# Patient Record
Sex: Female | Born: 1940
Health system: Southern US, Community
[De-identification: ages and names within clinical notes are randomized; demographics above are authoritative.]

## PROBLEM LIST (undated history)

## (undated) DIAGNOSIS — C801 Malignant (primary) neoplasm, unspecified: Secondary | ICD-10-CM

## (undated) DIAGNOSIS — F0280 Dementia in other diseases classified elsewhere without behavioral disturbance: Secondary | ICD-10-CM

## (undated) DIAGNOSIS — F329 Major depressive disorder, single episode, unspecified: Secondary | ICD-10-CM

## (undated) DIAGNOSIS — C50919 Malignant neoplasm of unspecified site of unspecified female breast: Secondary | ICD-10-CM

## (undated) DIAGNOSIS — H269 Unspecified cataract: Secondary | ICD-10-CM

## (undated) DIAGNOSIS — A4902 Methicillin resistant Staphylococcus aureus infection, unspecified site: Secondary | ICD-10-CM

## (undated) DIAGNOSIS — Z923 Personal history of irradiation: Secondary | ICD-10-CM

## (undated) DIAGNOSIS — E0500 Thyrotoxicosis with diffuse goiter without thyrotoxic crisis or storm: Secondary | ICD-10-CM

## (undated) DIAGNOSIS — A630 Anogenital (venereal) warts: Secondary | ICD-10-CM

## (undated) DIAGNOSIS — H3530 Unspecified macular degeneration: Secondary | ICD-10-CM

## (undated) DIAGNOSIS — F32A Depression, unspecified: Secondary | ICD-10-CM

## (undated) DIAGNOSIS — B029 Zoster without complications: Secondary | ICD-10-CM

## (undated) DIAGNOSIS — N183 Chronic kidney disease, stage 3 unspecified: Secondary | ICD-10-CM

## (undated) DIAGNOSIS — R3129 Other microscopic hematuria: Secondary | ICD-10-CM

## (undated) HISTORY — DX: Methicillin resistant Staphylococcus aureus infection, unspecified site: A49.02

## (undated) HISTORY — DX: Unspecified macular degeneration: H35.30

## (undated) HISTORY — PX: CHOLECYSTECTOMY: SHX55

## (undated) HISTORY — PX: TUBAL LIGATION: SHX77

## (undated) HISTORY — PX: BREAST LUMPECTOMY: SHX2

## (undated) HISTORY — DX: Other microscopic hematuria: R31.29

## (undated) HISTORY — PX: EYE SURGERY: SHX253

## (undated) HISTORY — DX: Chronic kidney disease, stage 3 unspecified: N18.30

## (undated) HISTORY — DX: Zoster without complications: B02.9

## (undated) HISTORY — PX: ABDOMINAL HYSTERECTOMY: SHX81

## (undated) HISTORY — DX: Unspecified cataract: H26.9

## (undated) HISTORY — DX: Anogenital (venereal) warts: A63.0

---
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
## 1988-05-07 HISTORY — PX: BREAST BIOPSY: SHX20

## 1991-11-05 HISTORY — PX: OTHER SURGICAL HISTORY: SHX169

## 1992-07-05 DIAGNOSIS — R3129 Other microscopic hematuria: Secondary | ICD-10-CM

## 1992-07-05 HISTORY — DX: Other microscopic hematuria: R31.29

## 1998-01-05 ENCOUNTER — Other Ambulatory Visit: Admission: RE | Admit: 1998-01-05 | Discharge: 1998-01-05 | Payer: Self-pay | Admitting: *Deleted

## 1998-01-05 DIAGNOSIS — A630 Anogenital (venereal) warts: Secondary | ICD-10-CM

## 1998-01-05 HISTORY — DX: Anogenital (venereal) warts: A63.0

## 1998-08-03 ENCOUNTER — Other Ambulatory Visit: Admission: RE | Admit: 1998-08-03 | Discharge: 1998-08-03 | Payer: Self-pay | Admitting: *Deleted

## 1998-08-10 ENCOUNTER — Other Ambulatory Visit: Admission: RE | Admit: 1998-08-10 | Discharge: 1998-08-10 | Payer: Self-pay | Admitting: *Deleted

## 1998-09-05 ENCOUNTER — Other Ambulatory Visit: Admission: RE | Admit: 1998-09-05 | Discharge: 1998-09-05 | Payer: Self-pay | Admitting: *Deleted

## 1998-12-26 ENCOUNTER — Other Ambulatory Visit: Admission: RE | Admit: 1998-12-26 | Discharge: 1998-12-26 | Payer: Self-pay | Admitting: *Deleted

## 1999-08-04 ENCOUNTER — Other Ambulatory Visit: Admission: RE | Admit: 1999-08-04 | Discharge: 1999-08-04 | Payer: Self-pay | Admitting: *Deleted

## 2000-07-12 ENCOUNTER — Other Ambulatory Visit: Admission: RE | Admit: 2000-07-12 | Discharge: 2000-07-12 | Payer: Self-pay | Admitting: *Deleted

## 2001-11-19 ENCOUNTER — Other Ambulatory Visit: Admission: RE | Admit: 2001-11-19 | Discharge: 2001-11-19 | Payer: Self-pay | Admitting: *Deleted

## 2004-05-07 DIAGNOSIS — C50919 Malignant neoplasm of unspecified site of unspecified female breast: Secondary | ICD-10-CM

## 2004-05-07 HISTORY — DX: Malignant neoplasm of unspecified site of unspecified female breast: C50.919

## 2004-05-25 ENCOUNTER — Encounter: Admission: RE | Admit: 2004-05-25 | Discharge: 2004-05-25 | Payer: Self-pay | Admitting: *Deleted

## 2005-06-14 ENCOUNTER — Encounter: Admission: RE | Admit: 2005-06-14 | Discharge: 2005-06-14 | Payer: Self-pay | Admitting: *Deleted

## 2005-07-05 ENCOUNTER — Encounter (INDEPENDENT_AMBULATORY_CARE_PROVIDER_SITE_OTHER): Payer: Self-pay | Admitting: Specialist

## 2005-07-05 ENCOUNTER — Encounter: Admission: RE | Admit: 2005-07-05 | Discharge: 2005-07-05 | Payer: Self-pay | Admitting: *Deleted

## 2005-07-05 ENCOUNTER — Encounter (INDEPENDENT_AMBULATORY_CARE_PROVIDER_SITE_OTHER): Payer: Self-pay | Admitting: Radiology

## 2005-07-05 DIAGNOSIS — C801 Malignant (primary) neoplasm, unspecified: Secondary | ICD-10-CM

## 2005-07-05 HISTORY — DX: Malignant (primary) neoplasm, unspecified: C80.1

## 2005-07-13 ENCOUNTER — Encounter: Admission: RE | Admit: 2005-07-13 | Discharge: 2005-07-13 | Payer: Self-pay | Admitting: General Surgery

## 2005-07-17 ENCOUNTER — Encounter: Admission: RE | Admit: 2005-07-17 | Discharge: 2005-07-17 | Payer: Self-pay | Admitting: General Surgery

## 2005-07-26 ENCOUNTER — Encounter (INDEPENDENT_AMBULATORY_CARE_PROVIDER_SITE_OTHER): Payer: Self-pay | Admitting: Specialist

## 2005-07-26 ENCOUNTER — Encounter: Admission: RE | Admit: 2005-07-26 | Discharge: 2005-07-26 | Payer: Self-pay | Admitting: General Surgery

## 2005-07-27 ENCOUNTER — Encounter: Admission: RE | Admit: 2005-07-27 | Discharge: 2005-07-27 | Payer: Self-pay | Admitting: General Surgery

## 2005-07-31 ENCOUNTER — Encounter: Admission: RE | Admit: 2005-07-31 | Discharge: 2005-07-31 | Payer: Self-pay | Admitting: General Surgery

## 2005-07-31 ENCOUNTER — Encounter (INDEPENDENT_AMBULATORY_CARE_PROVIDER_SITE_OTHER): Payer: Self-pay | Admitting: *Deleted

## 2005-07-31 ENCOUNTER — Ambulatory Visit (HOSPITAL_BASED_OUTPATIENT_CLINIC_OR_DEPARTMENT_OTHER): Admission: RE | Admit: 2005-07-31 | Discharge: 2005-07-31 | Payer: Self-pay | Admitting: General Surgery

## 2005-08-06 ENCOUNTER — Ambulatory Visit: Payer: Self-pay | Admitting: Oncology

## 2005-08-15 LAB — CBC WITH DIFFERENTIAL/PLATELET
Basophils Absolute: 0 10*3/uL (ref 0.0–0.1)
EOS%: 2.3 % (ref 0.0–7.0)
Eosinophils Absolute: 0.1 10*3/uL (ref 0.0–0.5)
HGB: 13.9 g/dL (ref 11.6–15.9)
MCH: 32.4 pg (ref 26.0–34.0)
NEUT#: 3.5 10*3/uL (ref 1.5–6.5)
RBC: 4.28 10*6/uL (ref 3.70–5.32)
RDW: 12.8 % (ref 11.3–14.5)
lymph#: 1.6 10*3/uL (ref 0.9–3.3)

## 2005-08-15 LAB — COMPREHENSIVE METABOLIC PANEL
AST: 17 U/L (ref 0–37)
Albumin: 4.4 g/dL (ref 3.5–5.2)
BUN: 28 mg/dL — ABNORMAL HIGH (ref 6–23)
Calcium: 9.5 mg/dL (ref 8.4–10.5)
Chloride: 104 mEq/L (ref 96–112)
Potassium: 4.4 mEq/L (ref 3.5–5.3)
Sodium: 140 mEq/L (ref 135–145)
Total Protein: 6.9 g/dL (ref 6.0–8.3)

## 2005-08-24 ENCOUNTER — Ambulatory Visit: Admission: RE | Admit: 2005-08-24 | Discharge: 2005-11-09 | Payer: Self-pay | Admitting: Radiation Oncology

## 2005-10-12 ENCOUNTER — Ambulatory Visit: Payer: Self-pay | Admitting: Oncology

## 2005-11-19 ENCOUNTER — Other Ambulatory Visit: Admission: RE | Admit: 2005-11-19 | Discharge: 2005-11-19 | Payer: Self-pay | Admitting: Obstetrics & Gynecology

## 2005-11-26 ENCOUNTER — Ambulatory Visit: Payer: Self-pay | Admitting: Oncology

## 2005-11-29 LAB — COMPREHENSIVE METABOLIC PANEL
Albumin: 3.9 g/dL (ref 3.5–5.2)
BUN: 21 mg/dL (ref 6–23)
CO2: 28 mEq/L (ref 19–32)
Calcium: 9.1 mg/dL (ref 8.4–10.5)
Chloride: 109 mEq/L (ref 96–112)
Glucose, Bld: 76 mg/dL (ref 70–99)
Potassium: 4.3 mEq/L (ref 3.5–5.3)

## 2005-11-29 LAB — CBC WITH DIFFERENTIAL/PLATELET
Basophils Absolute: 0 10*3/uL (ref 0.0–0.1)
Eosinophils Absolute: 0.1 10*3/uL (ref 0.0–0.5)
HGB: 13.2 g/dL (ref 11.6–15.9)
MCV: 95.1 fL (ref 81.0–101.0)
MONO#: 0.5 10*3/uL (ref 0.1–0.9)
MONO%: 11 % (ref 0.0–13.0)
NEUT#: 2.6 10*3/uL (ref 1.5–6.5)
Platelets: 244 10*3/uL (ref 145–400)
RDW: 12.8 % (ref 11.3–14.5)

## 2006-03-07 ENCOUNTER — Ambulatory Visit: Payer: Self-pay | Admitting: Oncology

## 2006-03-11 LAB — COMPREHENSIVE METABOLIC PANEL
Albumin: 4.2 g/dL (ref 3.5–5.2)
Alkaline Phosphatase: 59 U/L (ref 39–117)
BUN: 15 mg/dL (ref 6–23)
Glucose, Bld: 63 mg/dL — ABNORMAL LOW (ref 70–99)
Total Bilirubin: 0.6 mg/dL (ref 0.3–1.2)

## 2006-03-11 LAB — CBC WITH DIFFERENTIAL/PLATELET
Basophils Absolute: 0.1 10*3/uL (ref 0.0–0.1)
EOS%: 2.5 % (ref 0.0–7.0)
HCT: 40.9 % (ref 34.8–46.6)
HGB: 14 g/dL (ref 11.6–15.9)
MCH: 32.2 pg (ref 26.0–34.0)
MCV: 94.1 fL (ref 81.0–101.0)
MONO%: 10 % (ref 0.0–13.0)
NEUT%: 58.6 % (ref 39.6–76.8)

## 2006-04-19 ENCOUNTER — Ambulatory Visit: Payer: Self-pay | Admitting: Oncology

## 2006-04-23 LAB — CBC WITH DIFFERENTIAL/PLATELET
Basophils Absolute: 0 10*3/uL (ref 0.0–0.1)
HCT: 38.2 % (ref 34.8–46.6)
HGB: 12.9 g/dL (ref 11.6–15.9)
MCH: 31.7 pg (ref 26.0–34.0)
MONO#: 0.3 10*3/uL (ref 0.1–0.9)
NEUT%: 58.1 % (ref 39.6–76.8)
WBC: 3.7 10*3/uL — ABNORMAL LOW (ref 3.9–10.0)
lymph#: 1.1 10*3/uL (ref 0.9–3.3)

## 2006-06-18 ENCOUNTER — Encounter: Admission: RE | Admit: 2006-06-18 | Discharge: 2006-06-18 | Payer: Self-pay | Admitting: Oncology

## 2006-09-06 ENCOUNTER — Ambulatory Visit: Payer: Self-pay | Admitting: Oncology

## 2006-09-10 LAB — COMPREHENSIVE METABOLIC PANEL
AST: 17 U/L (ref 0–37)
Alkaline Phosphatase: 59 U/L (ref 39–117)
BUN: 20 mg/dL (ref 6–23)
Glucose, Bld: 102 mg/dL — ABNORMAL HIGH (ref 70–99)
Total Bilirubin: 0.6 mg/dL (ref 0.3–1.2)

## 2006-09-10 LAB — CBC WITH DIFFERENTIAL/PLATELET
BASO%: 0.5 % (ref 0.0–2.0)
EOS%: 2.1 % (ref 0.0–7.0)
HCT: 39.5 % (ref 34.8–46.6)
LYMPH%: 23.6 % (ref 14.0–48.0)
MCH: 32.1 pg (ref 26.0–34.0)
MCHC: 34.8 g/dL (ref 32.0–36.0)
MCV: 92.2 fL (ref 81.0–101.0)
MONO#: 0.4 10*3/uL (ref 0.1–0.9)
MONO%: 8.2 % (ref 0.0–13.0)
NEUT%: 65.6 % (ref 39.6–76.8)
Platelets: 238 10*3/uL (ref 145–400)
RBC: 4.28 10*6/uL (ref 3.70–5.32)
WBC: 4.4 10*3/uL (ref 3.9–10.0)

## 2006-10-15 ENCOUNTER — Ambulatory Visit: Payer: Self-pay | Admitting: Cardiology

## 2006-10-23 ENCOUNTER — Ambulatory Visit: Payer: Self-pay | Admitting: Cardiology

## 2006-11-11 ENCOUNTER — Ambulatory Visit: Payer: Self-pay | Admitting: Cardiology

## 2006-12-10 ENCOUNTER — Other Ambulatory Visit: Admission: RE | Admit: 2006-12-10 | Discharge: 2006-12-10 | Payer: Self-pay | Admitting: Obstetrics & Gynecology

## 2007-03-07 ENCOUNTER — Ambulatory Visit: Payer: Self-pay | Admitting: Oncology

## 2007-03-11 LAB — CBC WITH DIFFERENTIAL/PLATELET
Basophils Absolute: 0 10*3/uL (ref 0.0–0.1)
Eosinophils Absolute: 0.1 10*3/uL (ref 0.0–0.5)
HCT: 38.2 % (ref 34.8–46.6)
HGB: 13.6 g/dL (ref 11.6–15.9)
LYMPH%: 26.7 % (ref 14.0–48.0)
MONO#: 0.3 10*3/uL (ref 0.1–0.9)
NEUT#: 2.4 10*3/uL (ref 1.5–6.5)
NEUT%: 61.6 % (ref 39.6–76.8)
Platelets: 232 10*3/uL (ref 145–400)
WBC: 3.9 10*3/uL (ref 3.9–10.0)

## 2007-03-11 LAB — COMPREHENSIVE METABOLIC PANEL
BUN: 23 mg/dL (ref 6–23)
CO2: 30 mEq/L (ref 19–32)
Glucose, Bld: 99 mg/dL (ref 70–99)
Sodium: 140 mEq/L (ref 135–145)
Total Bilirubin: 0.6 mg/dL (ref 0.3–1.2)
Total Protein: 6.5 g/dL (ref 6.0–8.3)

## 2007-03-11 LAB — CANCER ANTIGEN 27.29: CA 27.29: 26 U/mL (ref 0–39)

## 2007-06-24 ENCOUNTER — Encounter: Admission: RE | Admit: 2007-06-24 | Discharge: 2007-06-24 | Payer: Self-pay | Admitting: Oncology

## 2007-08-27 ENCOUNTER — Ambulatory Visit: Payer: Self-pay | Admitting: Oncology

## 2007-08-27 LAB — COMPREHENSIVE METABOLIC PANEL
AST: 21 U/L (ref 0–37)
Albumin: 3.8 g/dL (ref 3.5–5.2)
Alkaline Phosphatase: 71 U/L (ref 39–117)
BUN: 18 mg/dL (ref 6–23)
Creatinine, Ser: 0.87 mg/dL (ref 0.40–1.20)
Glucose, Bld: 98 mg/dL (ref 70–99)
Potassium: 3.7 mEq/L (ref 3.5–5.3)

## 2007-08-27 LAB — CBC WITH DIFFERENTIAL/PLATELET
Basophils Absolute: 0.1 10*3/uL (ref 0.0–0.1)
EOS%: 2.2 % (ref 0.0–7.0)
Eosinophils Absolute: 0.1 10*3/uL (ref 0.0–0.5)
HCT: 40.2 % (ref 34.8–46.6)
HGB: 14.2 g/dL (ref 11.6–15.9)
LYMPH%: 27.4 % (ref 14.0–48.0)
MCH: 32.2 pg (ref 26.0–34.0)
MCV: 91 fL (ref 81.0–101.0)
MONO%: 9.8 % (ref 0.0–13.0)
NEUT#: 2.9 10*3/uL (ref 1.5–6.5)
NEUT%: 59.1 % (ref 39.6–76.8)
Platelets: 234 10*3/uL (ref 145–400)

## 2007-09-02 ENCOUNTER — Ambulatory Visit (HOSPITAL_COMMUNITY): Admission: RE | Admit: 2007-09-02 | Discharge: 2007-09-02 | Payer: Self-pay | Admitting: Oncology

## 2008-02-23 ENCOUNTER — Ambulatory Visit: Payer: Self-pay | Admitting: Oncology

## 2008-02-25 LAB — CBC WITH DIFFERENTIAL/PLATELET
BASO%: 2.2 % — ABNORMAL HIGH (ref 0.0–2.0)
Basophils Absolute: 0.1 10*3/uL (ref 0.0–0.1)
EOS%: 3.9 % (ref 0.0–7.0)
HCT: 38.1 % (ref 34.8–46.6)
HGB: 13.2 g/dL (ref 11.6–15.9)
MCH: 31.4 pg (ref 26.0–34.0)
MCHC: 34.6 g/dL (ref 32.0–36.0)
MCV: 90.7 fL (ref 81.0–101.0)
MONO%: 7.8 % (ref 0.0–13.0)
NEUT%: 58.9 % (ref 39.6–76.8)
RDW: 11.9 % (ref 11.3–14.5)
lymph#: 0.9 10*3/uL (ref 0.9–3.3)

## 2008-02-26 LAB — COMPREHENSIVE METABOLIC PANEL
ALT: 15 U/L (ref 0–35)
AST: 18 U/L (ref 0–37)
Alkaline Phosphatase: 62 U/L (ref 39–117)
BUN: 18 mg/dL (ref 6–23)
Chloride: 105 mEq/L (ref 96–112)
Creatinine, Ser: 0.9 mg/dL (ref 0.40–1.20)
Total Bilirubin: 0.5 mg/dL (ref 0.3–1.2)

## 2008-02-26 LAB — CANCER ANTIGEN 27.29: CA 27.29: 19 U/mL (ref 0–39)

## 2008-03-02 ENCOUNTER — Encounter: Admission: RE | Admit: 2008-03-02 | Discharge: 2008-03-02 | Payer: Self-pay | Admitting: Oncology

## 2008-03-03 ENCOUNTER — Ambulatory Visit (HOSPITAL_COMMUNITY): Admission: RE | Admit: 2008-03-03 | Discharge: 2008-03-03 | Payer: Self-pay | Admitting: Oncology

## 2008-03-11 LAB — CBC WITH DIFFERENTIAL/PLATELET
EOS%: 2.6 % (ref 0.0–7.0)
Eosinophils Absolute: 0.1 10*3/uL (ref 0.0–0.5)
LYMPH%: 23.3 % (ref 14.0–48.0)
MCH: 31.7 pg (ref 26.0–34.0)
MCV: 91.8 fL (ref 81.0–101.0)
MONO%: 12.7 % (ref 0.0–13.0)
NEUT#: 1.7 10*3/uL (ref 1.5–6.5)
Platelets: 198 10*3/uL (ref 145–400)
RBC: 4.45 10*6/uL (ref 3.70–5.32)

## 2008-03-11 LAB — COMPREHENSIVE METABOLIC PANEL
AST: 30 U/L (ref 0–37)
Alkaline Phosphatase: 66 U/L (ref 39–117)
BUN: 11 mg/dL (ref 6–23)
Chloride: 105 mEq/L (ref 96–112)
Creatinine, Ser: 0.84 mg/dL (ref 0.40–1.20)
Total Bilirubin: 0.8 mg/dL (ref 0.3–1.2)

## 2008-03-12 LAB — CANCER ANTIGEN 27.29: CA 27.29: 24 U/mL (ref 0–39)

## 2008-03-12 LAB — VITAMIN D 25 HYDROXY (VIT D DEFICIENCY, FRACTURES): Vit D, 25-Hydroxy: 27 ng/mL — ABNORMAL LOW (ref 30–89)

## 2008-06-29 ENCOUNTER — Encounter: Admission: RE | Admit: 2008-06-29 | Discharge: 2008-06-29 | Payer: Self-pay | Admitting: Oncology

## 2008-08-27 ENCOUNTER — Ambulatory Visit: Payer: Self-pay | Admitting: Oncology

## 2008-08-31 LAB — COMPREHENSIVE METABOLIC PANEL
ALT: 20 U/L (ref 0–35)
AST: 25 U/L (ref 0–37)
Albumin: 3.7 g/dL (ref 3.5–5.2)
CO2: 32 mEq/L (ref 19–32)
Calcium: 9 mg/dL (ref 8.4–10.5)
Chloride: 103 mEq/L (ref 96–112)
Creatinine, Ser: 0.9 mg/dL (ref 0.40–1.20)
Potassium: 3.5 mEq/L (ref 3.5–5.3)
Sodium: 141 mEq/L (ref 135–145)
Total Protein: 6.8 g/dL (ref 6.0–8.3)

## 2008-08-31 LAB — CBC WITH DIFFERENTIAL/PLATELET
BASO%: 1.1 % (ref 0.0–2.0)
EOS%: 2.2 % (ref 0.0–7.0)
MCH: 32.1 pg (ref 25.1–34.0)
MCHC: 34.7 g/dL (ref 31.5–36.0)
MONO#: 0.3 10*3/uL (ref 0.1–0.9)
RBC: 4.17 10*6/uL (ref 3.70–5.45)
RDW: 12.9 % (ref 11.2–14.5)
WBC: 4.1 10*3/uL (ref 3.9–10.3)
lymph#: 1.1 10*3/uL (ref 0.9–3.3)

## 2008-09-01 LAB — CANCER ANTIGEN 27.29: CA 27.29: 20 U/mL (ref 0–39)

## 2008-09-07 ENCOUNTER — Ambulatory Visit (HOSPITAL_COMMUNITY): Admission: RE | Admit: 2008-09-07 | Discharge: 2008-09-07 | Payer: Self-pay | Admitting: Oncology

## 2009-03-09 ENCOUNTER — Ambulatory Visit: Payer: Self-pay | Admitting: Oncology

## 2009-03-09 LAB — CBC WITH DIFFERENTIAL/PLATELET
BASO%: 0.9 % (ref 0.0–2.0)
Eosinophils Absolute: 0.1 10*3/uL (ref 0.0–0.5)
HCT: 38.5 % (ref 34.8–46.6)
LYMPH%: 32.2 % (ref 14.0–49.7)
MCHC: 34.4 g/dL (ref 31.5–36.0)
MCV: 94.1 fL (ref 79.5–101.0)
MONO#: 0.3 10*3/uL (ref 0.1–0.9)
MONO%: 8.3 % (ref 0.0–14.0)
NEUT%: 57.2 % (ref 38.4–76.8)
Platelets: 222 10*3/uL (ref 145–400)
RBC: 4.09 10*6/uL (ref 3.70–5.45)

## 2009-03-09 LAB — COMPREHENSIVE METABOLIC PANEL
Alkaline Phosphatase: 57 U/L (ref 39–117)
CO2: 27 mEq/L (ref 19–32)
Creatinine, Ser: 0.93 mg/dL (ref 0.40–1.20)
Glucose, Bld: 125 mg/dL — ABNORMAL HIGH (ref 70–99)
Sodium: 138 mEq/L (ref 135–145)
Total Bilirubin: 0.6 mg/dL (ref 0.3–1.2)
Total Protein: 6.4 g/dL (ref 6.0–8.3)

## 2009-03-09 LAB — CANCER ANTIGEN 27.29: CA 27.29: 21 U/mL (ref 0–39)

## 2009-07-05 ENCOUNTER — Encounter: Admission: RE | Admit: 2009-07-05 | Discharge: 2009-07-05 | Payer: Self-pay | Admitting: Oncology

## 2009-09-06 ENCOUNTER — Ambulatory Visit: Payer: Self-pay | Admitting: Oncology

## 2009-09-07 LAB — COMPREHENSIVE METABOLIC PANEL
ALT: 14 U/L (ref 0–35)
AST: 22 U/L (ref 0–37)
Albumin: 3.5 g/dL (ref 3.5–5.2)
Alkaline Phosphatase: 58 U/L (ref 39–117)
BUN: 14 mg/dL (ref 6–23)
CO2: 29 mEq/L (ref 19–32)
Calcium: 9 mg/dL (ref 8.4–10.5)
Chloride: 106 mEq/L (ref 96–112)
Creatinine, Ser: 0.94 mg/dL (ref 0.40–1.20)
Glucose, Bld: 83 mg/dL (ref 70–99)
Potassium: 3.7 mEq/L (ref 3.5–5.3)
Sodium: 140 mEq/L (ref 135–145)
Total Bilirubin: 0.6 mg/dL (ref 0.3–1.2)

## 2009-09-07 LAB — CBC WITH DIFFERENTIAL/PLATELET
Basophils Absolute: 0 10*3/uL (ref 0.0–0.1)
MCH: 31.8 pg (ref 25.1–34.0)
MCV: 93.4 fL (ref 79.5–101.0)
MONO#: 0.3 10*3/uL (ref 0.1–0.9)
RBC: 4.17 10*6/uL (ref 3.70–5.45)
WBC: 3.8 10*3/uL — ABNORMAL LOW (ref 3.9–10.3)

## 2009-09-13 ENCOUNTER — Ambulatory Visit (HOSPITAL_COMMUNITY): Admission: RE | Admit: 2009-09-13 | Discharge: 2009-09-13 | Payer: Self-pay | Admitting: Oncology

## 2010-03-07 ENCOUNTER — Ambulatory Visit: Payer: Self-pay | Admitting: Oncology

## 2010-03-09 LAB — CBC WITH DIFFERENTIAL/PLATELET
BASO%: 1.5 % (ref 0.0–2.0)
EOS%: 1.8 % (ref 0.0–7.0)
HCT: 40.3 % (ref 34.8–46.6)
HGB: 14.1 g/dL (ref 11.6–15.9)
LYMPH%: 25.7 % (ref 14.0–49.7)
MCV: 91.3 fL (ref 79.5–101.0)
MONO#: 0.4 10*3/uL (ref 0.1–0.9)
NEUT%: 61.7 % (ref 38.4–76.8)
RDW: 12.4 % (ref 11.2–14.5)
WBC: 4.3 10*3/uL (ref 3.9–10.3)

## 2010-03-09 LAB — COMPREHENSIVE METABOLIC PANEL
Albumin: 4.4 g/dL (ref 3.5–5.2)
Alkaline Phosphatase: 58 U/L (ref 39–117)
BUN: 16 mg/dL (ref 6–23)
Calcium: 9.3 mg/dL (ref 8.4–10.5)
Chloride: 102 mEq/L (ref 96–112)
Creatinine, Ser: 0.94 mg/dL (ref 0.40–1.20)
Potassium: 4 mEq/L (ref 3.5–5.3)
Sodium: 140 mEq/L (ref 135–145)

## 2010-05-27 ENCOUNTER — Other Ambulatory Visit: Payer: Self-pay | Admitting: Oncology

## 2010-05-27 DIAGNOSIS — Z9889 Other specified postprocedural states: Secondary | ICD-10-CM

## 2010-05-28 ENCOUNTER — Encounter: Payer: Self-pay | Admitting: Oncology

## 2010-06-06 ENCOUNTER — Ambulatory Visit (HOSPITAL_COMMUNITY)
Admission: RE | Admit: 2010-06-06 | Discharge: 2010-06-06 | Payer: Self-pay | Source: Home / Self Care | Attending: Obstetrics & Gynecology | Admitting: Obstetrics & Gynecology

## 2010-06-06 LAB — CBC
HCT: 41.6 % (ref 36.0–46.0)
MCH: 30.6 pg (ref 26.0–34.0)
MCHC: 33.9 g/dL (ref 30.0–36.0)
MCV: 90.2 fL (ref 78.0–100.0)

## 2010-07-10 ENCOUNTER — Ambulatory Visit
Admission: RE | Admit: 2010-07-10 | Discharge: 2010-07-10 | Disposition: A | Discharge status: Home / Self Care | Payer: Medicare Other | Source: Ambulatory Visit | Attending: Oncology | Admitting: Oncology

## 2010-07-10 DIAGNOSIS — Z9889 Other specified postprocedural states: Secondary | ICD-10-CM

## 2010-07-16 NOTE — Op Note (Signed)
NAMEVIVI, PICCIRILLI             ACCOUNT NO.:  [id number]  MEDICAL RECORD NO.:  [id number]          PATIENT TYPE:  AMB  LOCATION:  SDC                           FACILITY:  [facility]  PHYSICIAN:  M. Leda Quail, MD  DATE OF BIRTH:  1940-10-23  DATE OF PROCEDURE: DATE OF DISCHARGE:                              OPERATIVE REPORT   PREOPERATIVE DIAGNOSES: 64. A 70 year old white female with history of large right sided vulvar     lesion. 2. History of excision condyloma, same area about 2 years ago.  POSTOPERATIVE DIAGNOSES: 42. A 70 year old white female with history of large right sided vulvar     lesion. 2. History of excision condyloma, same area about 2 years ago.  PROCEDURE:  Wide local excision of right vulvar lesion.  SURGEON:  M. Leda Quail, MD.  ASSISTANT:  OR staff.  ANESTHESIA:  MAC, Dr. Malen Gauze oversaw the case.  SPECIMENS:  Vulvar lesion sent to Pathology.  FINDINGS:  Raised, large, cauliflower-appearing lesion.  ESTIMATED BLOOD LOSS:  Minimal.  FLUIDS:  1000 mL of LR.  URINE OUTPUT:  150 mL of clear urine draining with an I and O catheterization during the procedure.  COMPLICATIONS:  None.  INDICATIONS:  Ms. Victoria Sims is a very nice 70 year old single white female that I know quite well because she has pelvic relaxation and has changing and cleaning her pessary every 3 months.  I have seen this patient regularly for several years and in the last 4 months, she has had a very large vulvar lesion grow to the right vaginal introitus inferiorly.  There was essentially nothing there 4 months ago when I saw the patient the last time.  She did have a condyloma excised from this area several years ago which was sent to Pathology.  Because of this rapid growth, I recommended excision with sending it to pathology instead of topical chemical treatment or a laser ablation.  Risks and benefits have been explained to the patient.  She was desirous of proceeding and is  here today.  PROCEDURE:  The patient was taken to operating room.  She was placed in supine position.  Anesthesia was administered by the anesthesia staff without difficulty.  Legs positioned in the low lithotomy position in [place] stirrups.  SCDs were present on lower extremities bilaterally. Perineum, inner thighs, and the vagina prepped and draped in normal sterile fashion with Betadine prep.  Red rubber Foley catheter was used to drain the bladder of all urine.  The lesion is anesthetized with 1% lidocaine, 6 mL total was used.  Then with a #15 blade, the lesion is excised about 2 mm lateral all the way around in an ellipse formation. Four figure-of-eight sutures of #3-0 silk are used to close the lesion and make it hemostatic.  The patient did let me to place her pessary back, which we removed about a week ago in the office but with inspection of the vaginal cuff, she still has some abrasions from the pessary and I have elected to leave it out one more week.  When she comes back to see me next week in the office, we will  address replacing it at that time.  At this point, the procedure was ended, all instruments were removed from the vagina.  Sponge, lap, needle, and sponge counts were correct x2.  Betadine prep was cleansed off the skin. The patient is taken to her room in stable condition.     Lum Keas, MD     MSM/MEDQ  D:  06/06/2010  T:  06/07/2010  Job:  [id number]  Electronically Signed by Paul Half MD on 07/16/2010 02:59:38 PM

## 2010-09-18 ENCOUNTER — Other Ambulatory Visit: Payer: Self-pay | Admitting: Oncology

## 2010-09-18 ENCOUNTER — Encounter (HOSPITAL_BASED_OUTPATIENT_CLINIC_OR_DEPARTMENT_OTHER): Payer: Medicare Other | Admitting: Oncology

## 2010-09-18 DIAGNOSIS — C50919 Malignant neoplasm of unspecified site of unspecified female breast: Secondary | ICD-10-CM

## 2010-09-18 DIAGNOSIS — Z170 Estrogen receptor positive status [ER+]: Secondary | ICD-10-CM

## 2010-09-18 LAB — COMPREHENSIVE METABOLIC PANEL
ALT: 11 U/L (ref 0–35)
Alkaline Phosphatase: 57 U/L (ref 39–117)
Creatinine, Ser: 1.25 mg/dL — ABNORMAL HIGH (ref 0.40–1.20)
Sodium: 142 mEq/L (ref 135–145)
Total Bilirubin: 0.6 mg/dL (ref 0.3–1.2)
Total Protein: 6.5 g/dL (ref 6.0–8.3)

## 2010-09-18 LAB — CBC WITH DIFFERENTIAL/PLATELET
EOS%: 2.7 % (ref 0.0–7.0)
HCT: 39.3 % (ref 34.8–46.6)
HGB: 13.4 g/dL (ref 11.6–15.9)
MCH: 31.4 pg (ref 25.1–34.0)
MCHC: 34.2 g/dL (ref 31.5–36.0)
NEUT%: 59.4 % (ref 38.4–76.8)
RDW: 12.6 % (ref 11.2–14.5)
WBC: 3.9 10*3/uL (ref 3.9–10.3)
lymph#: 1.1 10*3/uL (ref 0.9–3.3)

## 2010-09-19 NOTE — Assessment & Plan Note (Signed)
[facility]                          [facility] NOTE   NAME:Moffet, KYLIA GRAJALES                    MRN:          [id number]  DATE:10/15/2006                            DOB:          [birth year]    REFERRING PHYSICIAN:  Valentino Hue. Magrinat, M.D.   CARDIOLOGY CONSULTATION   REASON FOR CONSULTATION:  Dyspnea on exertion and chest pain.   HISTORY OF PRESENT ILLNESS:  Ms. Hauswirth is a pleasant 70 year old  woman with a history of breast cancer status post radiation therapy and  surgical treatment with apparent remission.  Her cancer was diagnosed  approximately 1 year ago.  Additional problems include hyperlipidemia  and previous Grave's disease, now on thyroid hormone replacement.  She  has no person history of hypertension, diabetes mellitus, or coronary  artery disease.  She is referred with a fairly chronic history of  dyspnea on exertion.  Ms. Spurling states that she is not particularly  bothered by these symptoms, but does note that she becomes short of  breath sometimes when she is stressed or occasionally with activity.  She also has occasional chest pain described as indigestion that lasts  typically for a few minutes and does not have a specific trigger,  although she does seem to think that it happens more often when she is  active than when she is at rest.  She also reports some water brash in  the early morning hours and uses occasional over-the-counter Zantac.  She walks occasionally for exercise, up to 10 or 15 minutes at a time  and is able to accomplish this, not typically with chest pain although  sometimes she does experience symptoms.  She has had no prior cardiac  testing based on available information.  Her electrocardiogram today  shows sinus bradycardia at 55 beats per minute with nonspecific T wave  flattening.  We have been asked to evaluate her further.   ALLERGIES:  SULFA DRUGS.   PRESENT MEDICATIONS:  1. Paxil  20 mg p.o. daily.  2. Potassium chloride 10 mEq p.o. daily.  3. Lasix 20 mg p.o. daily.  4. Zocor 40 mg p.o. daily.  5. Levoxyl 1.25 mg p.o. daily.  6. Arthrotec 75 mg p.o. daily.  7. Calcium with vitamin D 600 mg p.o. daily.   PAST MEDICAL HISTORY:  As outlined above.  The patient is also status  post previous hysterectomy, cholecystectomy, and 5 eye surgeries.   FAMILY HISTORY:  The patient's father is 30 and has a fairly significant  history of peripheral vascular disease.  The patient's mother is 40 and  is status post pacemaker placement by Dr. Graciela Husbands in our practice.  No  apparent history of premature cardiovascular disease.   REVIEW OF SYSTEMS:  As described in the history of present illness.  Has  occasional joint stiffness.  Complains of chronic ankle edema that tends  to be worse towards the end of the day.  No obvious bleeding diathesis.  No fevers or chills, cough, hemoptysis, dizziness, or syncope.  She does  report occasional skipped heartbeats.   EXAMINATION:  Blood pressure 102/64, heart rate  55, weight 134 pounds.  The patient is normally nourished and in no acute distress.  HEENT:  Conjunctivae looks normal.  Oropharynx clear.  NECK:  Supple.  No elevated jugular venous pressure or loud bruits.  No  thyromegaly is noted.  LUNGS:  Clear.  No labored breathing at rest.  CARDIAC:  Regular rate and rhythm.  No loud murmur, pericardial rub, or  respiratory gallop.  ABDOMEN:  Soft and nontender.  No bruits.  Normoactive bowel sounds.  EXTREMITIES:  Exhibit trace edema around the ankles.  No pitting edema  is apparent on examination of the legs.  Distal pulses are 1 to 2+.  SKIN:  Warm and dry.  MUSCULOSKELETAL:  No kyphosis is noted.  NEURO/PSYCH:  The patient is alert and oriented x3.  Affect is normal.   IMPRESSION/RECOMMENDATIONS:  1. History of dyspnea on exertion and intermittent chest pain in a 89-      year-old woman with a history of tobacco use of 1 pack per  day for      the last 47 years, hyperlipidemia on statin therapy, prior breast      cancer status post radiation therapy.  Her electrocardiogram at      rest is nonspecific.  She has had no prior cardiac risk      stratification.  We discussed the issues today and will plan an      echocardiogram as well as exercise Myoview for further evaluation.      I will then have her follow up in the office to discuss the      results.  2. Smoking cessation is certainly recommended.  3. Further plan is to follow.     Jonelle Sidle, MD  Electronically Signed    SGM/MedQ  DD: 10/15/2006  DT: 10/15/2006  Job #: [id number]   cc:   Doreen Beam

## 2010-09-19 NOTE — Assessment & Plan Note (Signed)
[facility]                          [facility] NOTE   NAME:Victoria Sims, Victoria Sims                    MRN:          [id number]  DATE:11/11/2006                            DOB:          June 18, 1940    REASON FOR VISIT:  Followup cardiac testing.   HISTORY OF PRESENT ILLNESS:  I saw Victoria Sims back in June with a  history of dyspnea on exertion and intermittent chest pain which was  somewhat atypical in description. She had a history of tobacco use,  hyperlipidemia, and breast cancer status post radiation therapy, and was  referred for basic cardiac risk stratification. She had a 2D  echocardiogram which demonstrated normal left ventricular systolic  function with an ejection fraction of 60% to 65% and no segmental wall  motion abnormalities. No major valvular abnormalities were noted and  there was no pericardial effusion. Ischemic testing was performed with  an exercise Cardiolite that demonstrated adequate heart rate response  with no electrocardiographic changes and perfusion data showing no  evidence of ischemia with normal ejection fraction. I relayed these  results to the patient today and generally at this point would suggest  observation and reassurance. She does not report any typical exertional  symptoms at this point and still describes problems with indigestion.  She may ultimately need further gastrointestinal evaluation and is using  over-the-counter Zantac. She was comfortable with this.   ALLERGIES:  SULFA DRUGS.   PRESENT MEDICATIONS:  1. Paxil 20 mg p.o. daily.  2. Potassium 10 mEq daily.  3. Lasix 20 mg p.o. daily.  4. Zocor 40 mg p.o. daily.  5. Levoxyl 1.25 mg p.o. daily.  6. Arthrotec 75 mg p.o. daily.  7. Calcium 600 mg p.o. daily.   REVIEW OF SYSTEMS:  As described in history or present illness. All  othernegative.   PHYSICAL EXAMINATION:  Blood pressure is 99/65, heart rate is 54, weight  is 135 pounds.  Patient is comfortable and in no acute distress.  Examination of the neck shows no elevated jugular venous pressure or  loud bruits.  LUNGS: Clear without labored breathing.  CARDIAC EXAM: Reveals a regular rate and rhythm. There is no loud murmur  or S3 gallop.  EXTREMITIES: Exhibit no significant pitting edema.   IMPRESSION/RECOMMENDATIONS:  1. History of dyspnea on exertion (mild) and intermittent atypical      chest pain. Recent cardiac testing was reassuring indicating no      significant chronotropic incompetence,  normal left ventricular      systolic function, no major valvular abnormalities, and no clear      evidence of ischemia. I would recommend observation at this point.      She continues with regular exercise regimen. She may have some      symptoms of indigestion/reflux which are contributing as well. No      further cardiac testing planned at this point unless symptoms      progress.  2. Follow up will be p.r.n.     Jonelle Sidle, MD  Electronically Signed    SGM/MedQ  DD: 11/11/2006  DT: 11/11/2006  Job #: [id number]   cc:   Valentino Hue. Magrinat, M.D.

## 2010-09-22 NOTE — Op Note (Signed)
NAMECHARLENE, Sims             ACCOUNT NO.:  [id number]   MEDICAL RECORD NO.:  [id number]          PATIENT TYPE:  AMB   LOCATION:  DSC                          FACILITY:  [facility]   PHYSICIAN:  Ollen Gross. Vernell Morgans, M.D. DATE OF BIRTH:  Feb 27, 1941   DATE OF PROCEDURE:  07/31/2005  DATE OF DISCHARGE:                                 OPERATIVE REPORT   PREOPERATIVE DIAGNOSIS:  Right breast cancer.   POSTOPERATIVE DIAGNOSIS:  Right breast cancer.   PROCEDURE:  Right needle localized lumpectomy and sentinel lymph node biopsy  with injection of blue dye.   SURGEON:  Ollen Gross. Carolynne Edouard, M.D.   ANESTHESIA:  General.   PROCEDURE:  After informed consent was obtained, the patient was brought to  the operating room and placed in the supine position on the operating table.  After adequate induction of general anesthesia, the patient's right breast  and axilla were prepped with Betadine and draped in the usual sterile  manner.  A NeoProbe 2000 was used to identify the site of primary injection  as well as a single hot spot which was identified in the right axilla. The  primary counts at the primary injection site were approximately 12,000.  Earlier in the day, the patient had undergone nuclear medicine injection in  the subareolar area.  The patient also had had two areas in the right breast  wire localized, lower area was the area of the known cancer of another area  just above this was an area of suspicion and this area was all bracketed by  these wires.  Initially, 2 mL of methylene blue and 3 mL of injectable  saline were injected in the subareolar dermal area.  The tissue was massaged  gently for several minutes.  A small transverse incision was then made in  the right axilla overlying the hot spot. This incision was carried down  through the skin and subcutaneous tissue sharply with electrocautery until  the axilla was entered.  A Weitlaner retractor was deployed.  Blunt  dissection was then  carried out in the axilla until a blue lymph node was  identified and it also had increased radioactivity. This lymph node was  excised by a combination of sharp Bovie dissection and blunt hemostat  dissection.  Afferent and efferent lymphatics were clamped with hemostats,  divided and ligated 3-0 Vicryl ties.  The ex vivo counts on the lymph node  were approximately 120 and this lymph node was blue and was sent to  pathology for touch preps. There were no other hot spots identified in the  right axilla.  At this point the right axillary incision was closed with a  deep layer of interrupted 3-0 Vicryl stitches and the skin was closed with a  running 4-0 Monocryl subcuticular stitch.   Next, attention was turned to the right breast.  A single curvilinear  transverse incision was made with a 15 blade knife between the two wires.  This incision was carried down through the skin and subcutaneous tissues  sharply with electrocautery.  The path of the wires was fairly close to each  other and so a larger spherical area of breast tissue was excised sharply  around the course of these two wires.  This was done sharply with  electrocautery. Once this had been removed, the specimen was then oriented  with a long stitch being lateral and a short stitch being superior and sent  to pathology for further evaluation.  Hemostasis was achieved using Bovie  electrocautery.  The wound was then irrigated with copious amounts of  saline.  The incision was then closed with a deeper layer of interrupted 3-0  Vicryl stitches and the skin was closed with a running 4-0 Monocryl  subcuticular stitch.  Benzoin, Steri-Strips and sterile  dressings were applied.  The patient tolerated well.  At the end of the  case, all needle, sponge, and instrument counts were correct.  The touch  preps on the sentinel node were negative.  The patient was awakened and  taken to recovery in stable condition.      Ollen Gross. Vernell Morgans, M.D.  Electronically Signed     PST/MEDQ  D:  08/01/2005  T:  08/02/2005  Job:  [id number]

## 2011-05-09 DIAGNOSIS — F331 Major depressive disorder, recurrent, moderate: Secondary | ICD-10-CM | POA: Diagnosis not present

## 2011-05-16 DIAGNOSIS — E559 Vitamin D deficiency, unspecified: Secondary | ICD-10-CM | POA: Diagnosis not present

## 2011-05-16 DIAGNOSIS — F329 Major depressive disorder, single episode, unspecified: Secondary | ICD-10-CM | POA: Diagnosis not present

## 2011-05-16 DIAGNOSIS — E7800 Pure hypercholesterolemia, unspecified: Secondary | ICD-10-CM | POA: Diagnosis not present

## 2011-05-22 DIAGNOSIS — E7800 Pure hypercholesterolemia, unspecified: Secondary | ICD-10-CM | POA: Diagnosis not present

## 2011-05-22 DIAGNOSIS — F331 Major depressive disorder, recurrent, moderate: Secondary | ICD-10-CM | POA: Diagnosis not present

## 2011-05-22 DIAGNOSIS — F329 Major depressive disorder, single episode, unspecified: Secondary | ICD-10-CM | POA: Diagnosis not present

## 2011-05-22 DIAGNOSIS — E559 Vitamin D deficiency, unspecified: Secondary | ICD-10-CM | POA: Diagnosis not present

## 2011-05-22 DIAGNOSIS — R5383 Other fatigue: Secondary | ICD-10-CM | POA: Diagnosis not present

## 2011-06-11 ENCOUNTER — Other Ambulatory Visit: Payer: Self-pay | Admitting: Oncology

## 2011-06-11 DIAGNOSIS — Z9889 Other specified postprocedural states: Secondary | ICD-10-CM

## 2011-06-11 DIAGNOSIS — Z853 Personal history of malignant neoplasm of breast: Secondary | ICD-10-CM

## 2011-06-12 DIAGNOSIS — F331 Major depressive disorder, recurrent, moderate: Secondary | ICD-10-CM | POA: Diagnosis not present

## 2011-07-03 DIAGNOSIS — F331 Major depressive disorder, recurrent, moderate: Secondary | ICD-10-CM | POA: Diagnosis not present

## 2011-07-11 DIAGNOSIS — N3941 Urge incontinence: Secondary | ICD-10-CM | POA: Diagnosis not present

## 2011-07-17 ENCOUNTER — Ambulatory Visit
Admission: RE | Admit: 2011-07-17 | Discharge: 2011-07-17 | Disposition: A | Discharge status: Home / Self Care | Payer: Medicare Other | Source: Ambulatory Visit | Attending: Oncology | Admitting: Oncology

## 2011-07-17 DIAGNOSIS — Z853 Personal history of malignant neoplasm of breast: Secondary | ICD-10-CM | POA: Diagnosis not present

## 2011-07-17 DIAGNOSIS — Z9889 Other specified postprocedural states: Secondary | ICD-10-CM

## 2011-07-24 DIAGNOSIS — F331 Major depressive disorder, recurrent, moderate: Secondary | ICD-10-CM | POA: Diagnosis not present

## 2011-08-07 DIAGNOSIS — N318 Other neuromuscular dysfunction of bladder: Secondary | ICD-10-CM | POA: Diagnosis not present

## 2011-08-13 DIAGNOSIS — N3941 Urge incontinence: Secondary | ICD-10-CM | POA: Diagnosis not present

## 2011-08-21 DIAGNOSIS — F331 Major depressive disorder, recurrent, moderate: Secondary | ICD-10-CM | POA: Diagnosis not present

## 2011-09-04 DIAGNOSIS — F331 Major depressive disorder, recurrent, moderate: Secondary | ICD-10-CM | POA: Diagnosis not present

## 2011-09-04 DIAGNOSIS — N8111 Cystocele, midline: Secondary | ICD-10-CM | POA: Diagnosis not present

## 2011-09-17 DIAGNOSIS — R350 Frequency of micturition: Secondary | ICD-10-CM | POA: Diagnosis not present

## 2011-09-17 DIAGNOSIS — N3941 Urge incontinence: Secondary | ICD-10-CM | POA: Diagnosis not present

## 2011-09-17 DIAGNOSIS — R3915 Urgency of urination: Secondary | ICD-10-CM | POA: Diagnosis not present

## 2011-09-25 DIAGNOSIS — F331 Major depressive disorder, recurrent, moderate: Secondary | ICD-10-CM | POA: Diagnosis not present

## 2011-09-26 ENCOUNTER — Emergency Department (HOSPITAL_COMMUNITY): Payer: Medicare Other

## 2011-09-26 ENCOUNTER — Inpatient Hospital Stay (HOSPITAL_COMMUNITY)
Admission: EM | Admit: 2011-09-26 | Discharge: 2011-09-27 | DRG: 395 | Disposition: A | Discharge status: Home / Self Care | Payer: Medicare Other | Attending: General Surgery | Admitting: General Surgery

## 2011-09-26 ENCOUNTER — Encounter (HOSPITAL_COMMUNITY): Payer: Self-pay | Admitting: Emergency Medicine

## 2011-09-26 DIAGNOSIS — I498 Other specified cardiac arrhythmias: Secondary | ICD-10-CM | POA: Diagnosis not present

## 2011-09-26 DIAGNOSIS — F3289 Other specified depressive episodes: Secondary | ICD-10-CM | POA: Diagnosis present

## 2011-09-26 DIAGNOSIS — K4090 Unilateral inguinal hernia, without obstruction or gangrene, not specified as recurrent: Secondary | ICD-10-CM

## 2011-09-26 DIAGNOSIS — E0500 Thyrotoxicosis with diffuse goiter without thyrotoxic crisis or storm: Secondary | ICD-10-CM | POA: Diagnosis present

## 2011-09-26 DIAGNOSIS — R109 Unspecified abdominal pain: Secondary | ICD-10-CM | POA: Diagnosis present

## 2011-09-26 DIAGNOSIS — K4030 Unilateral inguinal hernia, with obstruction, without gangrene, not specified as recurrent: Principal | ICD-10-CM | POA: Diagnosis present

## 2011-09-26 DIAGNOSIS — F329 Major depressive disorder, single episode, unspecified: Secondary | ICD-10-CM | POA: Diagnosis present

## 2011-09-26 DIAGNOSIS — Z859 Personal history of malignant neoplasm, unspecified: Secondary | ICD-10-CM

## 2011-09-26 DIAGNOSIS — K56609 Unspecified intestinal obstruction, unspecified as to partial versus complete obstruction: Secondary | ICD-10-CM | POA: Diagnosis not present

## 2011-09-26 DIAGNOSIS — F17200 Nicotine dependence, unspecified, uncomplicated: Secondary | ICD-10-CM | POA: Diagnosis present

## 2011-09-26 DIAGNOSIS — R112 Nausea with vomiting, unspecified: Secondary | ICD-10-CM | POA: Diagnosis present

## 2011-09-26 DIAGNOSIS — R5383 Other fatigue: Secondary | ICD-10-CM | POA: Diagnosis not present

## 2011-09-26 HISTORY — DX: Malignant (primary) neoplasm, unspecified: C80.1

## 2011-09-26 HISTORY — DX: Depression, unspecified: F32.A

## 2011-09-26 HISTORY — DX: Major depressive disorder, single episode, unspecified: F32.9

## 2011-09-26 HISTORY — DX: Thyrotoxicosis with diffuse goiter without thyrotoxic crisis or storm: E05.00

## 2011-09-26 LAB — BASIC METABOLIC PANEL
BUN: 19 mg/dL (ref 6–23)
Calcium: 10.4 mg/dL (ref 8.4–10.5)
Creatinine, Ser: 0.98 mg/dL (ref 0.50–1.10)
GFR calc Af Amer: 67 mL/min — ABNORMAL LOW (ref >90)

## 2011-09-26 LAB — HEPATIC FUNCTION PANEL
Alkaline Phosphatase: 75 U/L (ref 39–117)
Bilirubin, Direct: 0.1 mg/dL (ref 0.0–0.3)
Indirect Bilirubin: 0.5 mg/dL (ref 0.3–0.9)
Total Protein: 7.3 g/dL (ref 6.0–8.3)

## 2011-09-26 LAB — CBC
HCT: 44.8 % (ref 36.0–46.0)
MCH: 30.5 pg (ref 26.0–34.0)
MCHC: 33.9 g/dL (ref 30.0–36.0)
MCV: 90 fL (ref 78.0–100.0)
RDW: 12.4 % (ref 11.5–15.5)

## 2011-09-26 LAB — DIFFERENTIAL
Basophils Absolute: 0 10*3/uL (ref 0.0–0.1)
Basophils Relative: 0 % (ref 0–1)
Eosinophils Relative: 0 % (ref 0–5)
Monocytes Absolute: 0.2 10*3/uL (ref 0.1–1.0)

## 2011-09-26 LAB — LIPASE, BLOOD: Lipase: 43 U/L (ref 11–59)

## 2011-09-26 MED ORDER — SIMVASTATIN 20 MG PO TABS
40.0000 mg | ORAL_TABLET | Freq: Every day | ORAL | Status: DC
  Administered 2011-09-26: 40 mg via ORAL
  Filled 2011-09-26: qty 2

## 2011-09-26 MED ORDER — DIPHENHYDRAMINE HCL 12.5 MG/5ML PO ELIX: 12.5000 mg | ORAL_SOLUTION | Freq: Four times a day (QID) | ORAL | Status: DC | PRN

## 2011-09-26 MED ORDER — DIPHENHYDRAMINE HCL 50 MG/ML IJ SOLN: 12.5000 mg | Freq: Four times a day (QID) | INTRAMUSCULAR | Status: DC | PRN

## 2011-09-26 MED ORDER — FUROSEMIDE 20 MG PO TABS
20.0000 mg | ORAL_TABLET | Freq: Every day | ORAL | Status: DC
  Filled 2011-09-26: qty 1

## 2011-09-26 MED ORDER — SODIUM CHLORIDE 0.9 % IV BOLUS (SEPSIS)
500.0000 mL | Freq: Once | INTRAVENOUS | Status: AC
  Administered 2011-09-26: 500 mL via INTRAVENOUS

## 2011-09-26 MED ORDER — ESCITALOPRAM OXALATE 10 MG PO TABS
40.0000 mg | ORAL_TABLET | Freq: Every day | ORAL | Status: DC
  Filled 2011-09-26: qty 4

## 2011-09-26 MED ORDER — IOHEXOL 300 MG/ML  SOLN: 100.0000 mL | Freq: Once | INTRAMUSCULAR | Status: AC | PRN

## 2011-09-26 MED ORDER — ONDANSETRON HCL 4 MG/2ML IJ SOLN
4.0000 mg | Freq: Once | INTRAMUSCULAR | Status: AC
  Administered 2011-09-26: 4 mg via INTRAVENOUS
  Filled 2011-09-26: qty 2

## 2011-09-26 MED ORDER — SODIUM CHLORIDE 0.9 % IV SOLN: INTRAVENOUS | Status: DC

## 2011-09-26 MED ORDER — ACETAMINOPHEN 325 MG PO TABS: 650.0000 mg | ORAL_TABLET | Freq: Four times a day (QID) | ORAL | Status: DC | PRN

## 2011-09-26 MED ORDER — KCL IN DEXTROSE-NACL 20-5-0.45 MEQ/L-%-% IV SOLN
INTRAVENOUS | Status: DC
  Administered 2011-09-26: 22:00:00 via INTRAVENOUS

## 2011-09-26 MED ORDER — IOHEXOL 300 MG/ML  SOLN
100.0000 mL | Freq: Once | INTRAMUSCULAR | Status: AC | PRN
  Administered 2011-09-26: 100 mL via INTRAVENOUS

## 2011-09-26 MED ORDER — ENOXAPARIN SODIUM 40 MG/0.4ML ~~LOC~~ SOLN
40.0000 mg | SUBCUTANEOUS | Status: DC
  Administered 2011-09-26: 40 mg via SUBCUTANEOUS
  Filled 2011-09-26: qty 0.4

## 2011-09-26 MED ORDER — ACETAMINOPHEN 650 MG RE SUPP: 650.0000 mg | Freq: Four times a day (QID) | RECTAL | Status: DC | PRN

## 2011-09-26 MED ORDER — HYDROCODONE-ACETAMINOPHEN 5-325 MG PO TABS: 1.0000 | ORAL_TABLET | ORAL | Status: DC | PRN

## 2011-09-26 MED ORDER — POTASSIUM CHLORIDE CRYS ER 10 MEQ PO TBCR
10.0000 meq | EXTENDED_RELEASE_TABLET | Freq: Every day | ORAL | Status: DC
  Administered 2011-09-26: 10 meq via ORAL
  Filled 2011-09-26 (×4): qty 1

## 2011-09-26 MED ORDER — ONDANSETRON HCL 4 MG/2ML IJ SOLN: 4.0000 mg | Freq: Four times a day (QID) | INTRAMUSCULAR | Status: DC | PRN

## 2011-09-26 MED ORDER — PNEUMOCOCCAL VAC POLYVALENT 25 MCG/0.5ML IJ INJ
0.5000 mL | INJECTION | INTRAMUSCULAR | Status: DC
  Filled 2011-09-26: qty 0.5

## 2011-09-26 MED ORDER — HYDROMORPHONE HCL PF 1 MG/ML IJ SOLN: 1.0000 mg | INTRAMUSCULAR | Status: DC | PRN

## 2011-09-26 MED ORDER — LEVOTHYROXINE SODIUM 25 MCG PO TABS
125.0000 ug | ORAL_TABLET | Freq: Every day | ORAL | Status: DC
  Filled 2011-09-26 (×2): qty 1

## 2011-09-26 MED ORDER — ONDANSETRON HCL 4 MG/2ML IJ SOLN: 4.0000 mg | Freq: Three times a day (TID) | INTRAMUSCULAR | Status: DC | PRN

## 2011-09-26 MED ORDER — HYDROMORPHONE HCL PF 1 MG/ML IJ SOLN
1.0000 mg | Freq: Once | INTRAMUSCULAR | Status: AC
  Administered 2011-09-26: 1 mg via INTRAVENOUS
  Filled 2011-09-26: qty 1

## 2011-09-26 NOTE — H&P (Signed)
Victoria Sims is an 71 y.o. female.   Chief Complaint: Nausea, vomiting, abdominal pain HPI: Patient is a 71 year old white female presented with a 24-hour history of worsening lower abdominal pain, nausea, vomiting. She states that she has had right groin pain with a mass intermittently in the past. Her primary care doctor has been unable to isolate it. She presented the emergency room for further evaluation treatment. A CT scan of the abdomen and pelvis revealed a small bowel obstruction secondary to incarcerated right inguinal hernia. When the emergency room physician went back to check about the right groin region, the hernia had reduced. She states her nausea and vomiting have significantly decreased. Mild abdominal pain is still present.  Past Medical History  Diagnosis Date  . Grave's disease   . Cancer   . Depression     Past Surgical History  Procedure Date  . Abdominal hysterectomy   . Cholecystectomy   . Eye surgery     History reviewed. No pertinent family history. Social History:  reports that she has been smoking.  She does not have any smokeless tobacco history on file. She reports that she does not drink alcohol or use illicit drugs.  Allergies:  Allergies  Allergen Reactions  . Sulfa Antibiotics Rash    Medications Prior to Admission  Medication Sig Dispense Refill  . Calcium-Vitamin D (CALTRATE 600 PLUS-VIT D PO) Take 1 tablet by mouth daily.      . Cholecalciferol (D3-1000) 1000 UNITS capsule Take 1,000 Units by mouth daily.      Marland Kitchen escitalopram (LEXAPRO) 20 MG tablet Take 40 mg by mouth daily.      . furosemide (LASIX) 20 MG tablet Take 20 mg by mouth daily.      Marland Kitchen levothyroxine (SYNTHROID, LEVOTHROID) 125 MCG tablet Take 125 mcg by mouth daily.      . potassium chloride (K-DUR) 10 MEQ tablet Take 10 mEq by mouth daily. Take 1 tablet on Monday, Tuesday, and Wednesday only. Do not take Thursday, Friday, Saturday, Sunday      . pravastatin (PRAVACHOL) 80 MG  tablet Take 80 mg by mouth daily.        Results for orders placed during the hospital encounter of 09/26/11 (from the past 48 hour(s))  CBC     Status: Abnormal   Collection Time   09/26/11 12:57 PM      Component Value Range Comment   WBC 8.0  4.0 - 10.5 (K/uL)    RBC 4.98  3.87 - 5.11 (MIL/uL)    Hemoglobin 15.2 (*) 12.0 - 15.0 (g/dL)    HCT 16.1  09.6 - 04.5 (%)    MCV 90.0  78.0 - 100.0 (fL)    MCH 30.5  26.0 - 34.0 (pg)    MCHC 33.9  30.0 - 36.0 (g/dL)    RDW 40.9  81.1 - 91.4 (%)    Platelets 186  150 - 400 (K/uL)   DIFFERENTIAL     Status: Abnormal   Collection Time   09/26/11 12:57 PM      Component Value Range Comment   Neutrophils Relative 91 (*) 43 - 77 (%)    Neutro Abs 7.3  1.7 - 7.7 (K/uL)    Lymphocytes Relative 6 (*) 12 - 46 (%)    Lymphs Abs 0.5 (*) 0.7 - 4.0 (K/uL)    Monocytes Relative 3  3 - 12 (%)    Monocytes Absolute 0.2  0.1 - 1.0 (K/uL)    Eosinophils Relative  0  0 - 5 (%)    Eosinophils Absolute 0.0  0.0 - 0.7 (K/uL)    Basophils Relative 0  0 - 1 (%)    Basophils Absolute 0.0  0.0 - 0.1 (K/uL)   BASIC METABOLIC PANEL     Status: Abnormal   Collection Time   09/26/11 12:57 PM      Component Value Range Comment   Sodium 139  135 - 145 (mEq/L)    Potassium 4.1  3.5 - 5.1 (mEq/L)    Chloride 99  96 - 112 (mEq/L)    CO2 32  19 - 32 (mEq/L)    Glucose, Bld 128 (*) 70 - 99 (mg/dL)    BUN 19  6 - 23 (mg/dL)    Creatinine, Ser 4.01  0.50 - 1.10 (mg/dL)    Calcium 02.7  8.4 - 10.5 (mg/dL)    GFR calc non Af Amer 58 (*) >90 (mL/min)    GFR calc Af Amer 67 (*) >90 (mL/min)   HEPATIC FUNCTION PANEL     Status: Normal   Collection Time   09/26/11 12:57 PM      Component Value Range Comment   Total Protein 7.3  6.0 - 8.3 (g/dL)    Albumin 3.6  3.5 - 5.2 (g/dL)    AST 24  0 - 37 (U/L)    ALT 14  0 - 35 (U/L)    Alkaline Phosphatase 75  39 - 117 (U/L)    Total Bilirubin 0.6  0.3 - 1.2 (mg/dL)    Bilirubin, Direct 0.1  0.0 - 0.3 (mg/dL)    Indirect  Bilirubin 0.5  0.3 - 0.9 (mg/dL)   LIPASE, BLOOD     Status: Normal   Collection Time   09/26/11 12:57 PM      Component Value Range Comment   Lipase 43  11 - 59 (U/L)   LACTIC ACID, PLASMA     Status: Normal   Collection Time   09/26/11  5:56 PM      Component Value Range Comment   Lactic Acid, Venous 1.4  0.5 - 2.2 (mmol/L)    Dg Chest 2 View  09/26/2011  *RADIOLOGY REPORT*  Clinical Data: Abdominal pain and weakness  CHEST - 2 VIEW  Comparison: 07/27/2005  Findings: Normal heart size.  Interstitial prominence.  Bronchitic changes.  No pneumothorax and no pleural effusion.  IMPRESSION: Chronic changes.  No active cardiopulmonary disease.  Original Report Authenticated By: Donavan Burnet, M.D.   Ct Abdomen Pelvis W Contrast  09/26/2011  *RADIOLOGY REPORT*  Clinical Data: Abdominal pain  CT ABDOMEN AND PELVIS WITH CONTRAST  Technique:  Multidetector CT imaging of the abdomen and pelvis was performed following the standard protocol during bolus administration of intravenous contrast.  Contrast: OMNIPAQUE IOHEXOL 300 MG/ML  SOLN  Comparison: None.  Findings: Right inguinal hernia and associated small bowel obstruction.  The stomach as well as several small bowel loops are distended.  The transition point from a distended small bowel to decompressed bowel occurs in a right inguinal hernia.  There is a small amount of fluid contained within the hernia sac worrisome for inflammatory change.  No pneumatosis.  No extraluminal bowel gas. The transition point occurs approximately 30 cm from the terminal ileum.  Bladder is distended.  No free fluid.  Uterus is absent.  Adnexa are unremarkable.  Small amount of free fluid is seen anterior to the liver.  Post cholecystectomy.  Pancreas, spleen, adrenal glands are within  normal limits.  Small cyst in the left kidney has a benign appearance. Other left kidney hypodensities are too small to characterize and are nonspecific.  Atherosclerotic changes of the aorta  are seen without focal stenosis.  Atherosclerotic changes at the origin of the celiac and SMA are noted.  IMPRESSION: Small bowel obstruction secondary to a right inguinal hernia.  The transition point occurs at approximately 30 cm from the terminal ileum.  Original Report Authenticated By: Donavan Burnet, M.D.    Review of Systems  HENT: Negative.   Eyes: Negative.   Respiratory: Negative.   Cardiovascular: Negative.   Gastrointestinal: Positive for nausea, vomiting and abdominal pain.  Genitourinary: Positive for frequency.  Musculoskeletal: Negative.   Skin: Negative.   Neurological: Negative.  Negative for weakness.  Endo/Heme/Allergies: Negative.     Blood pressure 102/66, pulse 57, temperature 98.9 F (37.2 C), temperature source Oral, resp. rate 20, height 5\' 1"  (1.549 m), weight 60.2 kg (132 lb 11.5 oz), SpO2 95.00%. Physical Exam  Constitutional: She is oriented to person, place, and time. She appears well-developed and well-nourished.  HENT:  Head: Normocephalic and atraumatic.  Neck: Normal range of motion. Neck supple.  Cardiovascular: Normal rate, regular rhythm and normal heart sounds.   Respiratory: Effort normal and breath sounds normal.  GI: Soft. There is no tenderness. There is no guarding.       Right inguinal hernia reduced.  Neurological: She is alert and oriented to person, place, and time.  Skin: Skin is warm and dry.  Psychiatric: She has a normal mood and affect. Her behavior is normal. Judgment and thought content normal.     Assessment/Plan Impression: Right inguinal hernia causing a small bowel obstruction, reduced at this point. She is being admitted to the hospital for control of her nausea and vomiting. She will be observed overnight. There is no need for acute surgical intervention at this point. She states that she does have impending surgery with urology to place a stimulator to help out with bladder atony. Will reassess in a.m.  Pearlee Arvizu  A 09/26/2011, 8:25 PM

## 2011-09-26 NOTE — ED Provider Notes (Addendum)
History   This chart was scribed for Shelda Jakes, MD by Clarita Crane. The patient was seen in room APA05/APA05. Patient's care was started at 1226.     CSN: [id number]  Arrival date & time 09/26/11  1226   First MD Initiated Contact with Patient 09/26/11 1311      Chief Complaint  Patient presents with  . Abdominal Pain    (Consider location/radiation/quality/duration/timing/severity/associated sxs/prior treatment) HPI Victoria Sims is a 71 y.o. female who presents to the Emergency Department complaining of constant moderate to severe lower abdominal pain described as sharp onset last night with associated nausea, 8 episodes of vomiting, subjective fever and decreased appetite. Patient rates abdominal pain 9 out of 10 currently. Patient reports experiencing similar pain to right side of abdomen 1 month ago which resolved on its own. Denies diarrhea, back pain, HA, neck pain, dysuria, SOB, chest pain rash. Patient with h/o abdominal hysterectomy, cholecystectomy, Grave's disease and is a current smoker.   Past Medical History  Diagnosis Date  . Grave's disease   . Cancer   . Depression     Past Surgical History  Procedure Date  . Abdominal hysterectomy   . Cholecystectomy   . Eye surgery     History reviewed. No pertinent family history.  History  Substance Use Topics  . Smoking status: Current Everyday Smoker  . Smokeless tobacco: Not on file  . Alcohol Use: No    OB History    Grav Para Term Preterm Abortions TAB SAB Ect Mult Living                  Review of Systems  Constitutional: Positive for fever and appetite change. Negative for chills.  HENT: Negative for rhinorrhea.   Eyes: Negative for pain.  Respiratory: Negative for cough and shortness of breath.   Cardiovascular: Negative for chest pain.  Gastrointestinal: Positive for nausea, vomiting and abdominal pain. Negative for diarrhea.  Genitourinary: Negative for dysuria.  Musculoskeletal:  Negative for back pain.  Skin: Negative for rash.  Neurological: Negative for weakness and headaches.    Allergies  Sulfa antibiotics  Home Medications   Current Outpatient Rx  Name Route Sig Dispense Refill  . CALTRATE 600 PLUS-VIT D PO Oral Take 1 tablet by mouth daily.    . CHOLECALCIFEROL 1000 UNITS PO CAPS Oral Take 1,000 Units by mouth daily.    Marland Kitchen ESCITALOPRAM OXALATE 20 MG PO TABS Oral Take 40 mg by mouth daily.    . FUROSEMIDE 20 MG PO TABS Oral Take 20 mg by mouth daily.    Marland Kitchen LEVOTHYROXINE SODIUM 125 MCG PO TABS Oral Take 125 mcg by mouth daily.    Marland Kitchen POTASSIUM CHLORIDE ER 10 MEQ PO TBCR Oral Take 10 mEq by mouth daily. Take 1 tablet on Monday, Tuesday, and Wednesday only. Do not take Thursday, Friday, Saturday, Sunday    . PRAVASTATIN SODIUM 80 MG PO TABS Oral Take 80 mg by mouth daily.      BP 122/53  Pulse 57  Temp(Src) 98.7 F (37.1 C) (Oral)  Resp 17  Ht 5\' 1"  (1.549 m)  Wt 125 lb (56.7 kg)  BMI 23.62 kg/m2  SpO2 98%  Physical Exam  Nursing note and vitals reviewed. Constitutional: She is oriented to person, place, and time. She appears well-developed and well-nourished. No distress.  HENT:  Head: Normocephalic and atraumatic.  Eyes: EOM are normal. Pupils are equal, round, and reactive to light.  Neck: Neck supple. No tracheal  deviation present.  Cardiovascular: Normal rate and regular rhythm.  Exam reveals no gallop and no friction rub.   No murmur heard. Pulmonary/Chest: Effort normal. No respiratory distress. She has no wheezes. She has no rales.  Abdominal: Soft. Bowel sounds are normal. She exhibits no distension. There is no tenderness.       Well healed midline surgical scar.   Musculoskeletal: Normal range of motion. She exhibits edema (trace to left lower leg).  Neurological: She is alert and oriented to person, place, and time. No cranial nerve deficit or sensory deficit.  Skin: Skin is warm and dry.  Psychiatric: She has a normal mood and affect.  Her behavior is normal.    ED Course  Procedures (including critical care time)  DIAGNOSTIC STUDIES: Oxygen Saturation is 98% on room air, normal by my interpretation.    COORDINATION OF CARE: 2:58PM- Patient informed of current plan for treatment and evaluation and agrees with plan at this time.     Labs Reviewed  CBC - Abnormal; Notable for the following:    Hemoglobin 15.2 (*)    All other components within normal limits  DIFFERENTIAL - Abnormal; Notable for the following:    Neutrophils Relative 91 (*)    Lymphocytes Relative 6 (*)    Lymphs Abs 0.5 (*)    All other components within normal limits  BASIC METABOLIC PANEL - Abnormal; Notable for the following:    Glucose, Bld 128 (*)    GFR calc non Af Amer 58 (*)    GFR calc Af Amer 67 (*)    All other components within normal limits  HEPATIC FUNCTION PANEL  LIPASE, BLOOD   Dg Chest 2 View  09/26/2011  *RADIOLOGY REPORT*  Clinical Data: Abdominal pain and weakness  CHEST - 2 VIEW  Comparison: 07/27/2005  Findings: Normal heart size.  Interstitial prominence.  Bronchitic changes.  No pneumothorax and no pleural effusion.  IMPRESSION: Chronic changes.  No active cardiopulmonary disease.  Original Report Authenticated By: Donavan Burnet, M.D.   Ct Abdomen Pelvis W Contrast  09/26/2011  *RADIOLOGY REPORT*  Clinical Data: Abdominal pain  CT ABDOMEN AND PELVIS WITH CONTRAST  Technique:  Multidetector CT imaging of the abdomen and pelvis was performed following the standard protocol during bolus administration of intravenous contrast.  Contrast: OMNIPAQUE IOHEXOL 300 MG/ML  SOLN  Comparison: None.  Findings: Right inguinal hernia and associated small bowel obstruction.  The stomach as well as several small bowel loops are distended.  The transition point from a distended small bowel to decompressed bowel occurs in a right inguinal hernia.  There is a small amount of fluid contained within the hernia sac worrisome for inflammatory  change.  No pneumatosis.  No extraluminal bowel gas. The transition point occurs approximately 30 cm from the terminal ileum.  Bladder is distended.  No free fluid.  Uterus is absent.  Adnexa are unremarkable.  Small amount of free fluid is seen anterior to the liver.  Post cholecystectomy.  Pancreas, spleen, adrenal glands are within normal limits.  Small cyst in the left kidney has a benign appearance. Other left kidney hypodensities are too small to characterize and are nonspecific.  Atherosclerotic changes of the aorta are seen without focal stenosis.  Atherosclerotic changes at the origin of the celiac and SMA are noted.  IMPRESSION: Small bowel obstruction secondary to a right inguinal hernia.  The transition point occurs at approximately 30 cm from the terminal ileum.  Original Report Authenticated By: Merton Border  Joseph Art, M.D.   Results for orders placed during the hospital encounter of 09/26/11  CBC      Component Value Range   WBC 8.0  4.0 - 10.5 (K/uL)   RBC 4.98  3.87 - 5.11 (MIL/uL)   Hemoglobin 15.2 (*) 12.0 - 15.0 (g/dL)   HCT 40.9  81.1 - 91.4 (%)   MCV 90.0  78.0 - 100.0 (fL)   MCH 30.5  26.0 - 34.0 (pg)   MCHC 33.9  30.0 - 36.0 (g/dL)   RDW 78.2  95.6 - 21.3 (%)   Platelets 186  150 - 400 (K/uL)  DIFFERENTIAL      Component Value Range   Neutrophils Relative 91 (*) 43 - 77 (%)   Neutro Abs 7.3  1.7 - 7.7 (K/uL)   Lymphocytes Relative 6 (*) 12 - 46 (%)   Lymphs Abs 0.5 (*) 0.7 - 4.0 (K/uL)   Monocytes Relative 3  3 - 12 (%)   Monocytes Absolute 0.2  0.1 - 1.0 (K/uL)   Eosinophils Relative 0  0 - 5 (%)   Eosinophils Absolute 0.0  0.0 - 0.7 (K/uL)   Basophils Relative 0  0 - 1 (%)   Basophils Absolute 0.0  0.0 - 0.1 (K/uL)  BASIC METABOLIC PANEL      Component Value Range   Sodium 139  135 - 145 (mEq/L)   Potassium 4.1  3.5 - 5.1 (mEq/L)   Chloride 99  96 - 112 (mEq/L)   CO2 32  19 - 32 (mEq/L)   Glucose, Bld 128 (*) 70 - 99 (mg/dL)   BUN 19  6 - 23 (mg/dL)   Creatinine,  Ser 0.86  0.50 - 1.10 (mg/dL)   Calcium 57.8  8.4 - 10.5 (mg/dL)   GFR calc non Af Amer 58 (*) >90 (mL/min)   GFR calc Af Amer 67 (*) >90 (mL/min)  HEPATIC FUNCTION PANEL      Component Value Range   Total Protein 7.3  6.0 - 8.3 (g/dL)   Albumin 3.6  3.5 - 5.2 (g/dL)   AST 24  0 - 37 (U/L)   ALT 14  0 - 35 (U/L)   Alkaline Phosphatase 75  39 - 117 (U/L)   Total Bilirubin 0.6  0.3 - 1.2 (mg/dL)   Bilirubin, Direct 0.1  0.0 - 0.3 (mg/dL)   Indirect Bilirubin 0.5  0.3 - 0.9 (mg/dL)  LIPASE, BLOOD      Component Value Range   Lipase 43  11 - 59 (U/L)    Date: 09/26/2011  Rate: 57  Rhythm: sinus bradycardia  QRS Axis: left  Intervals: normal  ST/T Wave abnormalities: nonspecific ST/T changes  Conduction Disutrbances:none  Narrative Interpretation:   Old EKG Reviewed: unchanged EKG unchanged from 07/27/2005   1. SBO (small bowel obstruction)   2. Right inguinal hernia       MDM   CT scan confirmed small bowel obstruction due to a right inguinal hernia. Patient improved in the emergency department with pain medicine and nausea medicine have a consult in to talk to Gen. surgery for the admission.  An addendum on reevaluation I suspect that the writing more hernia reduced itself since a CT has been done no evidence of a bulge there also patient feels as if the fullness has resolved in that area. Also patient says that the nausea has resolved. Discussed with general surgery Dr. Lovell Sheehan he will admit and observe overnight.    I personally performed the services described  in this documentation, which was scribed in my presence. The recorded information has been reviewed and considered.     Shelda Jakes, MD 09/26/11 1732  Shelda Jakes, MD 09/29/11 [phone]

## 2011-09-26 NOTE — ED Notes (Signed)
abd pain since last night to upper/mid abd area. Vomited total 7 times. 10 at worst but now rates pain 4. No diarrhea. Alert/oriented. Pain is sharp to abd area. Denies bloody or black emesis. Generalized weakness observed. cbd in route 119.

## 2011-09-27 NOTE — Progress Notes (Signed)
Patient given discharge instructions with no questions. Told to f/u with Dr. Lovell Sheehan if symptoms reoccur. No questions at this time. Taken out of facility by NT via wheelchair.

## 2011-09-27 NOTE — Discharge Summary (Signed)
Physician Discharge Summary  Patient ID: EULALA NEWCOMBE MRN: [id number] DOB/AGE: March 23, 1941 71 y.o.  Admit date: 09/26/2011 Discharge date: 09/27/2011  Admission Diagnoses: Small bowel obstruction secondary to right inguinal hernia  Discharge Diagnoses:same,  Resolved Active Problems:  * No active hospital problems. *    Discharged Condition: good  Hospital Course:  patient is a 71 year old white female who presented emergency room yesterday evening with worsening nausea, vomiting, and right groin pain. She is on CT scan the abdomen to have a small bowel obstruction secondary to incarcerated right renal hernia. This spontaneously reduced on its own while she was in the emergency room. She was made to the hospital overnight for control of her nausea, vomiting, abdominal pain. She had no significant nausea or vomiting overnight. Her abdominal pain is minimal. She is being discharged home in good improving condition.  Significant Diagnostic Studies: radiology: CT scan: As above   Discharge Exam: Blood pressure 102/62, pulse 65, temperature 98.5 F (36.9 C), temperature source Oral, resp. rate 20, height 5\' 1"  (1.549 m), weight 60.2 kg (132 lb 11.5 oz), SpO2 98.00%. General appearance: alert, cooperative and no distress Resp: clear to auscultation bilaterally Cardio: regular rate and rhythm, S1, S2 normal, no murmur, click, rub or gallop GI: soft, non-tender; bowel sounds normal; no masses,  no organomegaly. No incarcerated contents in right inguinal hernia.  Disposition: home   Medication List  As of 09/27/2011  7:06 AM   TAKE these medications         CALTRATE 600 PLUS-VIT D PO   Take 1 tablet by mouth daily.      D3-1000 1000 UNITS capsule   Generic drug: Cholecalciferol   Take 1,000 Units by mouth daily.      escitalopram 20 MG tablet   Commonly known as: LEXAPRO   Take 40 mg by mouth daily.      furosemide 20 MG tablet   Commonly known as: LASIX   Take 20 mg by mouth  daily.      levothyroxine 125 MCG tablet   Commonly known as: SYNTHROID, LEVOTHROID   Take 125 mcg by mouth daily.      potassium chloride 10 MEQ tablet   Commonly known as: K-DUR   Take 10 mEq by mouth daily. Take 1 tablet on Monday, Tuesday, and Wednesday only. Do not take Thursday, Friday, Saturday, Sunday      pravastatin 80 MG tablet   Commonly known as: PRAVACHOL   Take 80 mg by mouth daily.           Follow-up Information    Follow up with Dalia Heading, MD. (As needed)    Contact information:   [address] [place] [address] [phone]          Signed: Dalia Heading 09/27/2011, 7:06 AM

## 2011-09-27 NOTE — Progress Notes (Signed)
UR Chart Review Completed  

## 2011-09-27 NOTE — Discharge Instructions (Signed)
Hernia  A hernia occurs when an internal organ pushes out through a weak spot in the abdominal wall. Hernias most commonly occur in the groin and around the navel. Hernias often can be pushed back into place (reduced). Most hernias tend to get worse over time. Some abdominal hernias can get stuck in the opening (irreducible or incarcerated hernia) and cannot be reduced. An irreducible abdominal hernia which is tightly squeezed into the opening is at risk for impaired blood supply (strangulated hernia). A strangulated hernia is a medical emergency. Because of the risk for an irreducible or strangulated hernia, surgery may be recommended to repair a hernia.  CAUSES    Heavy lifting.   Prolonged coughing.   Straining to have a bowel movement.   A cut (incision) made during an abdominal surgery.  HOME CARE INSTRUCTIONS    Bed rest is not required. You may continue your normal activities.   Avoid lifting more than 10 pounds (4.5 kg) or straining.   Cough gently. If you are a smoker it is best to stop. Even the best hernia repair can break down with the continual strain of coughing. Even if you do not have your hernia repaired, a cough will continue to aggravate the problem.   Do not wear anything tight over your hernia. Do not try to keep it in with an outside bandage or truss. These can damage abdominal contents if they are trapped within the hernia sac.   Eat a normal diet.   Avoid constipation. Straining over long periods of time will increase hernia size and encourage breakdown of repairs. If you cannot do this with diet alone, stool softeners may be used.  SEEK IMMEDIATE MEDICAL CARE IF:    You have a fever.   You develop increasing abdominal pain.   You feel nauseous or vomit.   Your hernia is stuck outside the abdomen, looks discolored, feels hard, or is tender.   You have any changes in your bowel habits or in the hernia that are unusual for you.   You have increased pain or swelling around the  hernia.   You cannot push the hernia back in place by applying gentle pressure while lying down.  MAKE SURE YOU:    Understand these instructions.   Will watch your condition.   Will get help right away if you are not doing well or get worse.  Document Released: 04/23/2005 Document Revised: 04/12/2011 Document Reviewed: 12/11/2007  [employer] Patient Information 2012 [employer]

## 2011-09-28 ENCOUNTER — Telehealth (INDEPENDENT_AMBULATORY_CARE_PROVIDER_SITE_OTHER): Payer: Self-pay | Admitting: General Surgery

## 2011-09-28 NOTE — Telephone Encounter (Signed)
Victoria Sims called in stating she saw patient who has inguinal hernia w/intermittent bowel obstruction. She wanted an appointment for the patient to be seen. She stated the contact information for the patient was [phone] (cell) and [phone] (work). She also requested we fax her [phone]) with the appointment date for this patient so that she would have that information for her records (still on paper charts). She advised patient was seen at [facility] this week.

## 2011-09-28 NOTE — Telephone Encounter (Signed)
Called patient to advise that her gyn Leda Quail called and wanted an appointment set up for her. I advised I checked and the only appointment I could make for her was 10/22/11 at 2:30 pm. I advised that was the best I could find because of the doctors availability. I advised that if there was a cancellation we will try to contact her in order to be seen sooner.

## 2011-09-29 ENCOUNTER — Encounter (HOSPITAL_COMMUNITY): Payer: Self-pay | Admitting: Anesthesiology

## 2011-09-29 ENCOUNTER — Encounter (HOSPITAL_COMMUNITY): Admission: EM | Disposition: A | Discharge status: Home / Self Care | Payer: Self-pay | Source: Home / Self Care

## 2011-09-29 ENCOUNTER — Emergency Department (HOSPITAL_COMMUNITY): Payer: Medicare Other

## 2011-09-29 ENCOUNTER — Inpatient Hospital Stay (HOSPITAL_COMMUNITY): Payer: Medicare Other | Admitting: Anesthesiology

## 2011-09-29 ENCOUNTER — Inpatient Hospital Stay (HOSPITAL_COMMUNITY)
Admission: EM | Admit: 2011-09-29 | Discharge: 2011-10-01 | DRG: 352 | Disposition: A | Discharge status: Home / Self Care | Payer: Medicare Other | Attending: Surgery | Admitting: Surgery

## 2011-09-29 DIAGNOSIS — Z9079 Acquired absence of other genital organ(s): Secondary | ICD-10-CM

## 2011-09-29 DIAGNOSIS — K56609 Unspecified intestinal obstruction, unspecified as to partial versus complete obstruction: Secondary | ICD-10-CM

## 2011-09-29 DIAGNOSIS — R197 Diarrhea, unspecified: Secondary | ICD-10-CM | POA: Diagnosis not present

## 2011-09-29 DIAGNOSIS — E0500 Thyrotoxicosis with diffuse goiter without thyrotoxic crisis or storm: Secondary | ICD-10-CM | POA: Diagnosis not present

## 2011-09-29 DIAGNOSIS — F3289 Other specified depressive episodes: Secondary | ICD-10-CM | POA: Diagnosis not present

## 2011-09-29 DIAGNOSIS — K4030 Unilateral inguinal hernia, with obstruction, without gangrene, not specified as recurrent: Secondary | ICD-10-CM | POA: Diagnosis not present

## 2011-09-29 DIAGNOSIS — F329 Major depressive disorder, single episode, unspecified: Secondary | ICD-10-CM | POA: Diagnosis not present

## 2011-09-29 DIAGNOSIS — Z9089 Acquired absence of other organs: Secondary | ICD-10-CM

## 2011-09-29 DIAGNOSIS — F17200 Nicotine dependence, unspecified, uncomplicated: Secondary | ICD-10-CM | POA: Diagnosis present

## 2011-09-29 DIAGNOSIS — Z79899 Other long term (current) drug therapy: Secondary | ICD-10-CM

## 2011-09-29 DIAGNOSIS — K4031 Unilateral inguinal hernia, with obstruction, without gangrene, recurrent: Principal | ICD-10-CM | POA: Diagnosis present

## 2011-09-29 DIAGNOSIS — R112 Nausea with vomiting, unspecified: Secondary | ICD-10-CM | POA: Diagnosis not present

## 2011-09-29 DIAGNOSIS — K4090 Unilateral inguinal hernia, without obstruction or gangrene, not specified as recurrent: Secondary | ICD-10-CM | POA: Diagnosis not present

## 2011-09-29 DIAGNOSIS — R109 Unspecified abdominal pain: Secondary | ICD-10-CM | POA: Diagnosis not present

## 2011-09-29 DIAGNOSIS — R52 Pain, unspecified: Secondary | ICD-10-CM | POA: Diagnosis not present

## 2011-09-29 HISTORY — PX: INGUINAL HERNIA REPAIR: SHX194

## 2011-09-29 LAB — COMPREHENSIVE METABOLIC PANEL
AST: 24 U/L (ref 0–37)
CO2: 33 mEq/L — ABNORMAL HIGH (ref 19–32)
Calcium: 9.4 mg/dL (ref 8.4–10.5)
Creatinine, Ser: 0.94 mg/dL (ref 0.50–1.10)
GFR calc Af Amer: 71 mL/min — ABNORMAL LOW (ref >90)
GFR calc non Af Amer: 61 mL/min — ABNORMAL LOW (ref >90)

## 2011-09-29 LAB — SURGICAL PCR SCREEN: Staphylococcus aureus: NEGATIVE

## 2011-09-29 LAB — CBC
HCT: 41.4 % (ref 36.0–46.0)
MCH: 30.2 pg (ref 26.0–34.0)
MCHC: 33.8 g/dL (ref 30.0–36.0)
MCV: 89.4 fL (ref 78.0–100.0)
RDW: 12.3 % (ref 11.5–15.5)
WBC: 7.9 10*3/uL (ref 4.0–10.5)

## 2011-09-29 LAB — DIFFERENTIAL
Basophils Absolute: 0 10*3/uL (ref 0.0–0.1)
Eosinophils Absolute: 0 10*3/uL (ref 0.0–0.7)
Eosinophils Relative: 0 % (ref 0–5)
Lymphocytes Relative: 11 % — ABNORMAL LOW (ref 12–46)
Monocytes Absolute: 0.7 10*3/uL (ref 0.1–1.0)

## 2011-09-29 SURGERY — REPAIR, HERNIA, INGUINAL, ADULT
Anesthesia: General | Site: Groin | Laterality: Right

## 2011-09-29 MED ORDER — CEFAZOLIN SODIUM 1-5 GM-% IV SOLN
1.0000 g | Freq: Once | INTRAVENOUS | Status: AC
  Administered 2011-09-29: 1 g via INTRAVENOUS
  Filled 2011-09-29 (×2): qty 50

## 2011-09-29 MED ORDER — MORPHINE SULFATE 2 MG/ML IJ SOLN
2.0000 mg | INTRAMUSCULAR | Status: DC | PRN
  Administered 2011-09-29 – 2011-09-30 (×6): 2 mg via INTRAVENOUS
  Filled 2011-09-29 (×7): qty 1

## 2011-09-29 MED ORDER — SODIUM CHLORIDE 0.9 % IV BOLUS (SEPSIS)
500.0000 mL | Freq: Once | INTRAVENOUS | Status: AC
  Administered 2011-09-29: 500 mL via INTRAVENOUS

## 2011-09-29 MED ORDER — ACETAMINOPHEN 10 MG/ML IV SOLN
INTRAVENOUS | Status: DC | PRN
  Administered 2011-09-29: 1000 mg via INTRAVENOUS

## 2011-09-29 MED ORDER — BUPIVACAINE HCL (PF) 0.25 % IJ SOLN
INTRAMUSCULAR | Status: DC | PRN
  Administered 2011-09-29: 30 mL

## 2011-09-29 MED ORDER — PROMETHAZINE HCL 25 MG/ML IJ SOLN: 6.2500 mg | INTRAMUSCULAR | Status: DC | PRN

## 2011-09-29 MED ORDER — KCL IN DEXTROSE-NACL 20-5-0.45 MEQ/L-%-% IV SOLN
INTRAVENOUS | Status: DC
  Administered 2011-09-29 – 2011-10-01 (×4): via INTRAVENOUS
  Filled 2011-09-29 (×7): qty 1000

## 2011-09-29 MED ORDER — ACETAMINOPHEN 650 MG RE SUPP: 650.0000 mg | Freq: Four times a day (QID) | RECTAL | Status: DC | PRN

## 2011-09-29 MED ORDER — HYDROMORPHONE HCL PF 1 MG/ML IJ SOLN
0.2500 mg | INTRAMUSCULAR | Status: DC | PRN
  Administered 2011-09-29 (×4): 0.5 mg via INTRAVENOUS

## 2011-09-29 MED ORDER — BUPIVACAINE HCL (PF) 0.25 % IJ SOLN
INTRAMUSCULAR | Status: AC
  Filled 2011-09-29: qty 30

## 2011-09-29 MED ORDER — ACETAMINOPHEN 325 MG PO TABS: 650.0000 mg | ORAL_TABLET | Freq: Four times a day (QID) | ORAL | Status: DC | PRN

## 2011-09-29 MED ORDER — PROPOFOL 10 MG/ML IV EMUL
INTRAVENOUS | Status: DC | PRN
  Administered 2011-09-29: 120 mg via INTRAVENOUS

## 2011-09-29 MED ORDER — CISATRACURIUM BESYLATE (PF) 10 MG/5ML IV SOLN
INTRAVENOUS | Status: DC | PRN
  Administered 2011-09-29: 5 mg via INTRAVENOUS

## 2011-09-29 MED ORDER — 0.9 % SODIUM CHLORIDE (POUR BTL) OPTIME
TOPICAL | Status: DC | PRN
  Administered 2011-09-29: 1000 mL

## 2011-09-29 MED ORDER — ONDANSETRON HCL 4 MG/2ML IJ SOLN: 4.0000 mg | Freq: Four times a day (QID) | INTRAMUSCULAR | Status: DC | PRN

## 2011-09-29 MED ORDER — SUCCINYLCHOLINE CHLORIDE 20 MG/ML IJ SOLN
INTRAMUSCULAR | Status: DC | PRN
  Administered 2011-09-29: 60 mg via INTRAVENOUS

## 2011-09-29 MED ORDER — LACTATED RINGERS IV SOLN
INTRAVENOUS | Status: DC | PRN
  Administered 2011-09-29 (×2): via INTRAVENOUS

## 2011-09-29 MED ORDER — ACETAMINOPHEN 10 MG/ML IV SOLN
INTRAVENOUS | Status: AC
  Filled 2011-09-29: qty 100

## 2011-09-29 MED ORDER — GLYCOPYRROLATE 0.2 MG/ML IJ SOLN
INTRAMUSCULAR | Status: DC | PRN
  Administered 2011-09-29: .4 mg via INTRAVENOUS

## 2011-09-29 MED ORDER — LEVOTHYROXINE SODIUM 125 MCG PO TABS
125.0000 ug | ORAL_TABLET | Freq: Every day | ORAL | Status: DC
  Administered 2011-09-29: 125 ug via ORAL
  Filled 2011-09-29: qty 1

## 2011-09-29 MED ORDER — ESCITALOPRAM OXALATE 20 MG PO TABS
40.0000 mg | ORAL_TABLET | Freq: Every day | ORAL | Status: DC
  Filled 2011-09-29: qty 2

## 2011-09-29 MED ORDER — ONDANSETRON HCL 4 MG/2ML IJ SOLN
INTRAMUSCULAR | Status: DC | PRN
  Administered 2011-09-29: 4 mg via INTRAVENOUS

## 2011-09-29 MED ORDER — HYDROMORPHONE HCL PF 1 MG/ML IJ SOLN
INTRAMUSCULAR | Status: AC
  Filled 2011-09-29: qty 1

## 2011-09-29 MED ORDER — MORPHINE SULFATE 2 MG/ML IJ SOLN
2.0000 mg | Freq: Once | INTRAMUSCULAR | Status: AC
  Administered 2011-09-29: 2 mg via INTRAVENOUS
  Filled 2011-09-29: qty 1

## 2011-09-29 MED ORDER — LEVOTHYROXINE SODIUM 125 MCG PO TABS
125.0000 ug | ORAL_TABLET | Freq: Every day | ORAL | Status: DC
  Administered 2011-09-29 – 2011-10-01 (×3): 125 ug via ORAL
  Filled 2011-09-29 (×3): qty 1

## 2011-09-29 MED ORDER — ENOXAPARIN SODIUM 40 MG/0.4ML ~~LOC~~ SOLN
40.0000 mg | SUBCUTANEOUS | Status: DC
  Administered 2011-09-29 – 2011-10-01 (×3): 40 mg via SUBCUTANEOUS
  Filled 2011-09-29 (×3): qty 0.4

## 2011-09-29 MED ORDER — ONDANSETRON HCL 4 MG/2ML IJ SOLN
4.0000 mg | Freq: Once | INTRAMUSCULAR | Status: AC
  Administered 2011-09-29: 4 mg via INTRAVENOUS
  Filled 2011-09-29: qty 2

## 2011-09-29 MED ORDER — ESCITALOPRAM OXALATE 20 MG PO TABS
40.0000 mg | ORAL_TABLET | Freq: Every day | ORAL | Status: DC
  Administered 2011-09-29: 40 mg via ORAL
  Filled 2011-09-29: qty 2

## 2011-09-29 MED ORDER — EPHEDRINE SULFATE 50 MG/ML IJ SOLN
INTRAMUSCULAR | Status: DC | PRN
  Administered 2011-09-29 (×2): 5 mg via INTRAVENOUS

## 2011-09-29 MED ORDER — NEOSTIGMINE METHYLSULFATE 1 MG/ML IJ SOLN
INTRAMUSCULAR | Status: DC | PRN
  Administered 2011-09-29: 3 mg via INTRAVENOUS

## 2011-09-29 MED ORDER — POTASSIUM CHLORIDE ER 10 MEQ PO TBCR
10.0000 meq | EXTENDED_RELEASE_TABLET | Freq: Every day | ORAL | Status: DC
  Administered 2011-09-29: 10 meq via ORAL
  Filled 2011-09-29: qty 1

## 2011-09-29 MED ORDER — DEXAMETHASONE SODIUM PHOSPHATE 4 MG/ML IJ SOLN
INTRAMUSCULAR | Status: DC | PRN
  Administered 2011-09-29: 6 mg via INTRAVENOUS

## 2011-09-29 MED ORDER — FUROSEMIDE 20 MG PO TABS
20.0000 mg | ORAL_TABLET | Freq: Every day | ORAL | Status: DC
  Administered 2011-09-29: 20 mg via ORAL
  Filled 2011-09-29: qty 1

## 2011-09-29 MED ORDER — ESCITALOPRAM OXALATE 20 MG PO TABS
40.0000 mg | ORAL_TABLET | Freq: Every day | ORAL | Status: DC
  Administered 2011-09-30 – 2011-10-01 (×2): 40 mg via ORAL
  Filled 2011-09-29 (×2): qty 2

## 2011-09-29 MED ORDER — SUFENTANIL CITRATE 50 MCG/ML IV SOLN
INTRAVENOUS | Status: DC | PRN
  Administered 2011-09-29: 20 ug via INTRAVENOUS
  Administered 2011-09-29: 10 ug via INTRAVENOUS
  Administered 2011-09-29: 5 ug via INTRAVENOUS

## 2011-09-29 MED ORDER — LIDOCAINE HCL (CARDIAC) 20 MG/ML IV SOLN
INTRAVENOUS | Status: DC | PRN
  Administered 2011-09-29: 75 mg via INTRAVENOUS

## 2011-09-29 MED ORDER — LACTATED RINGERS IV SOLN: INTRAVENOUS | Status: DC

## 2011-09-29 MED ORDER — FUROSEMIDE 20 MG PO TABS
20.0000 mg | ORAL_TABLET | Freq: Every day | ORAL | Status: DC
  Administered 2011-09-29 – 2011-09-30 (×2): 20 mg via ORAL
  Filled 2011-09-29 (×3): qty 1

## 2011-09-29 SURGICAL SUPPLY — 82 items
ADH SKN CLS APL DERMABOND .7 (GAUZE/BANDAGES/DRESSINGS) ×6
APL SKNCLS STERI-STRIP NONHPOA (GAUZE/BANDAGES/DRESSINGS)
APPLIER CLIP 5 13 M/L LIGAMAX5 (MISCELLANEOUS)
APPLIER CLIP ROT 10 11.4 M/L (STAPLE)
APR CLP MED LRG 11.4X10 (STAPLE)
APR CLP MED LRG 5 ANG JAW (MISCELLANEOUS)
BENZOIN TINCTURE PRP APPL 2/3 (GAUZE/BANDAGES/DRESSINGS) IMPLANT
BLADE EXTENDED COATED 6.5IN (ELECTRODE) IMPLANT
BLADE HEX COATED 2.75 (ELECTRODE) ×4 IMPLANT
BLADE SURG SZ10 CARB STEEL (BLADE) IMPLANT
CANISTER SUCTION 2500CC (MISCELLANEOUS) ×4 IMPLANT
CANNULA ENDOPATH XCEL 11M (ENDOMECHANICALS) IMPLANT
CLIP APPLIE 5 13 M/L LIGAMAX5 (MISCELLANEOUS) IMPLANT
CLIP APPLIE ROT 10 11.4 M/L (STAPLE) IMPLANT
CLOTH BEACON ORANGE TIMEOUT ST (SAFETY) ×4 IMPLANT
COVER MAYO STAND STRL (DRAPES) ×2 IMPLANT
DECANTER SPIKE VIAL GLASS SM (MISCELLANEOUS) ×4 IMPLANT
DERMABOND ADVANCED (GAUZE/BANDAGES/DRESSINGS) ×2
DERMABOND ADVANCED .7 DNX12 (GAUZE/BANDAGES/DRESSINGS) ×2 IMPLANT
DRAIN PENROSE 18X1/2 LTX STRL (DRAIN) IMPLANT
DRAPE LAPAROSCOPIC ABDOMINAL (DRAPES) ×4 IMPLANT
DRAPE WARM FLUID 44X44 (DRAPE) IMPLANT
ELECT REM PT RETURN 9FT ADLT (ELECTROSURGICAL) ×4
ELECTRODE REM PT RTRN 9FT ADLT (ELECTROSURGICAL) ×3 IMPLANT
FILTER SMOKE EVAC LAPAROSHD (FILTER) IMPLANT
GAUZE SPONGE 4X4 16PLY XRAY LF (GAUZE/BANDAGES/DRESSINGS) IMPLANT
GLOVE BIO SURGEON STRL SZ7 (GLOVE) ×4 IMPLANT
GLOVE BIOGEL M 7.0 STRL (GLOVE) ×4 IMPLANT
GLOVE BIOGEL PI IND STRL 7.0 (GLOVE) ×3 IMPLANT
GLOVE BIOGEL PI IND STRL 7.5 (GLOVE) ×3 IMPLANT
GLOVE BIOGEL PI INDICATOR 7.0 (GLOVE) ×1
GLOVE BIOGEL PI INDICATOR 7.5 (GLOVE) ×1
GOWN PREVENTION PLUS LG XLONG (DISPOSABLE) ×4 IMPLANT
GOWN PREVENTION PLUS XLARGE (GOWN DISPOSABLE) ×4 IMPLANT
GOWN STRL NON-REIN LRG LVL3 (GOWN DISPOSABLE) ×4 IMPLANT
GOWN STRL REIN XL XLG (GOWN DISPOSABLE) ×4 IMPLANT
KIT BASIN OR (CUSTOM PROCEDURE TRAY) ×4 IMPLANT
LIGASURE IMPACT 36 18CM CVD LR (INSTRUMENTS) IMPLANT
MESH ULTRAPRO 3X6 7.6X15CM (Mesh General) ×2 IMPLANT
NDL HYPO 25X1 1.5 SAFETY (NEEDLE) ×2 IMPLANT
NEEDLE HYPO 25X1 1.5 SAFETY (NEEDLE) ×4 IMPLANT
NS IRRIG 1000ML POUR BTL (IV SOLUTION) ×4 IMPLANT
PACK GENERAL/GYN (CUSTOM PROCEDURE TRAY) ×4 IMPLANT
PENCIL BUTTON HOLSTER BLD 10FT (ELECTRODE) IMPLANT
SCALPEL HARMONIC ACE (MISCELLANEOUS) IMPLANT
SET IRRIG TUBING LAPAROSCOPIC (IRRIGATION / IRRIGATOR) IMPLANT
SOLUTION ANTI FOG 6CC (MISCELLANEOUS) ×2 IMPLANT
SPONGE GAUZE 4X4 12PLY (GAUZE/BANDAGES/DRESSINGS) ×4 IMPLANT
SPONGE LAP 18X18 X RAY DECT (DISPOSABLE) IMPLANT
STAPLER VISISTAT 35W (STAPLE) IMPLANT
STRIP CLOSURE SKIN 1/2X4 (GAUZE/BANDAGES/DRESSINGS) IMPLANT
SUCTION POOLE TIP (SUCTIONS) IMPLANT
SUT MNCRL AB 4-0 PS2 18 (SUTURE) ×4 IMPLANT
SUT PDS AB 1 TP1 96 (SUTURE) IMPLANT
SUT PROLENE 2 0 KS (SUTURE) IMPLANT
SUT PROLENE 2 0 SH DA (SUTURE) ×6 IMPLANT
SUT SILK 2 0 (SUTURE)
SUT SILK 2 0 SH (SUTURE) ×2 IMPLANT
SUT SILK 2 0 SH CR/8 (SUTURE) IMPLANT
SUT SILK 2-0 18XBRD TIE 12 (SUTURE) IMPLANT
SUT SILK 3 0 (SUTURE)
SUT SILK 3 0 SH CR/8 (SUTURE) IMPLANT
SUT SILK 3-0 18XBRD TIE 12 (SUTURE) IMPLANT
SUT VIC AB 2-0 SH 27 (SUTURE) ×16
SUT VIC AB 2-0 SH 27X BRD (SUTURE) ×6 IMPLANT
SUT VIC AB 3-0 54XBRD REEL (SUTURE) ×2 IMPLANT
SUT VIC AB 3-0 BRD 54 (SUTURE)
SUT VIC AB 3-0 SH 27 (SUTURE) ×4
SUT VIC AB 3-0 SH 27XBRD (SUTURE) ×3 IMPLANT
SYR CONTROL 10ML LL (SYRINGE) ×4 IMPLANT
SYS LAPSCP GELPORT 120MM (MISCELLANEOUS)
SYSTEM LAPSCP GELPORT 120MM (MISCELLANEOUS) IMPLANT
TOWEL OR 17X26 10 PK STRL BLUE (TOWEL DISPOSABLE) ×4 IMPLANT
TRAY FOLEY CATH 14FRSI W/METER (CATHETERS) ×4 IMPLANT
TRAY LAP CHOLE (CUSTOM PROCEDURE TRAY) ×2 IMPLANT
TROCAR BLADELESS OPT 5 75 (ENDOMECHANICALS) ×4 IMPLANT
TROCAR XCEL 12X100 BLDLESS (ENDOMECHANICALS) IMPLANT
TROCAR XCEL BLUNT TIP 100MML (ENDOMECHANICALS) IMPLANT
TROCAR XCEL NON-BLD 11X100MML (ENDOMECHANICALS) IMPLANT
TUBING INSUFFLATION 10FT LAP (TUBING) ×2 IMPLANT
YANKAUER SUCT BULB TIP 10FT TU (MISCELLANEOUS) IMPLANT
YANKAUER SUCT BULB TIP NO VENT (SUCTIONS) IMPLANT

## 2011-09-29 NOTE — ED Notes (Signed)
ZOX:[id number]<AV> Expected date:09/29/11<BR> Expected time:12:50 AM<BR> Means of arrival:Ambulance<BR> Comments:<BR> abd pain, Hx of hernia, waiting for the surgery, Zofran 4mg  given

## 2011-09-29 NOTE — ED Notes (Signed)
Report received from Night RN. First contact with patient. EDP in room. Pt waiting for surgeon, alert, oriented, nad noted. NG on intermittent suction, yellow/green content 100cc noted in canister

## 2011-09-29 NOTE — Progress Notes (Signed)
Waiting on IV pole and channel from supplies to begin fluids and antibiotic. Annitta Needs, RN

## 2011-09-29 NOTE — Transfer of Care (Signed)
Immediate Anesthesia Transfer of Care Note  Patient: Victoria Sims  Procedure(s) Performed: Procedure(s) (LRB): HERNIA REPAIR INGUINAL ADULT (Right) INSERTION OF MESH (Right)  Patient Location: PACU  Anesthesia Type: General  Level of Consciousness: awake, alert  and oriented  Airway & Oxygen Therapy: Patient Spontanous Breathing and Patient connected to face mask oxygen  Post-op Assessment: Report given to PACU RN and Post -op Vital signs reviewed and stable  Post vital signs: Reviewed and stable  Complications: No apparent anesthesia complications

## 2011-09-29 NOTE — Anesthesia Postprocedure Evaluation (Signed)
  Anesthesia Post-op Note  Patient: Victoria Sims  Procedure(s) Performed: Procedure(s) (LRB): HERNIA REPAIR INGUINAL ADULT (Right) INSERTION OF MESH (Right)  Patient Location: PACU  Anesthesia Type: General  Level of Consciousness: awake and alert   Airway and Oxygen Therapy: Patient Spontanous Breathing  Post-op Pain: mild  Post-op Assessment: Post-op Vital signs reviewed, Patient's Cardiovascular Status Stable, Respiratory Function Stable, Patent Airway and No signs of Nausea or vomiting  Post-op Vital Signs: stable  Complications: No apparent anesthesia complications

## 2011-09-29 NOTE — ED Provider Notes (Signed)
Medical screening examination/treatment/procedure(s) were conducted as a shared visit with non-physician practitioner(s) and myself.  I personally evaluated the patient during the encounter  7:20 AM Awaiting surgeon to evaluate patient. Vision comfortable at this time. Abdomen soft, diffusely tender. Nasogastric tube has been placed.  Hanley Seamen, MD 09/29/11 [phone]

## 2011-09-29 NOTE — Anesthesia Procedure Notes (Signed)
Date/Time: 09/29/2011 3:05 PM Performed by: Leroy Libman L Patient Re-evaluated:Patient Re-evaluated prior to inductionOxygen Delivery Method: Circle system utilized Preoxygenation: Pre-oxygenation with 100% oxygen Intubation Type: IV induction, Rapid sequence and Cricoid Pressure applied Laryngoscope Size: Miller and 2 Grade View: Grade I Tube type: Oral Tube size: 7.5 mm Number of attempts: 1 Airway Equipment and Method: Stylet Placement Confirmation: ETT inserted through vocal cords under direct vision,  breath sounds checked- equal and bilateral and positive ETCO2 Secured at: 20 cm Tube secured with: Tape Dental Injury: Teeth and Oropharynx as per pre-operative assessment

## 2011-09-29 NOTE — Anesthesia Preprocedure Evaluation (Addendum)
Anesthesia Evaluation  Patient identified by MRN, date of birth, ID band Patient awake    Reviewed: Allergy & Precautions, H&P , NPO status , Patient's Chart, lab work & pertinent test results  Airway Mallampati: II TM Distance: >3 FB Neck ROM: Full    Dental No notable dental hx.    Pulmonary Current Smoker,  CXR: Bronchitic changes. No acute disease. breath sounds clear to auscultation  Pulmonary exam normal       Cardiovascular Exercise Tolerance: Good negative cardio ROS  Rhythm:Regular Rate:Normal  ECG: SB, LAD, Nonspecific ST and TWA. No significant change.   Neuro/Psych PSYCHIATRIC DISORDERS Depression negative neurological ROS     GI/Hepatic negative GI ROS, Neg liver ROS,   Endo/Other  Hypothyroidism Hyperthyroidism   Renal/GU negative Renal ROS  negative genitourinary   Musculoskeletal negative musculoskeletal ROS (+)   Abdominal   Peds negative pediatric ROS (+)  Hematology negative hematology ROS (+)   Anesthesia Other Findings   Reproductive/Obstetrics negative OB ROS                          Anesthesia Physical Anesthesia Plan  ASA: II  Anesthesia Plan: General   Post-op Pain Management:    Induction: Intravenous  Airway Management Planned: Oral ETT  Additional Equipment:   Intra-op Plan:   Post-operative Plan: Extubation in OR  Informed Consent: I have reviewed the patients History and Physical, chart, labs and discussed the procedure including the risks, benefits and alternatives for the proposed anesthesia with the patient or authorized representative who has indicated his/her understanding and acceptance.   Dental advisory given  Plan Discussed with: CRNA  Anesthesia Plan Comments:         Anesthesia Quick Evaluation

## 2011-09-29 NOTE — H&P (Signed)
Reason for Consult:Inguinal hernia and bowel obstruction Referring Physician: [place]  Victoria Sims is an 71 y.o. female.  HPI: this patient presents for evaluation of a right inguinal hernia and bowel structuring. She was seen 3 days ago at [facility] and admitted for a bowel obstruction secondary to a right inguinal hernia. The hernia was reduced in the emergency room there and she was admitted for overnight observation and she seemed to improve. She was discharged home. She had subsequent increasing abdominal pain and nausea and vomiting and had actually called our office and schedule an appointment to see Dr. Jamey Ripa later this week. Because of the increasing abdominal pain and vomiting she came into the emergency room. She says that she is current on her colonoscopy and her bowels have been normal. She had a bowel movement yesterday.  Past Medical History  Diagnosis Date  . Grave's disease   . Cancer   . Depression     Past Surgical History  Procedure Date  . Abdominal hysterectomy   . Cholecystectomy   . Eye surgery     No family history on file.  Social History:  reports that she has been smoking.  She does not have any smokeless tobacco history on file. She reports that she does not drink alcohol or use illicit drugs.  Allergies:  Allergies  Allergen Reactions  . Sulfa Antibiotics Rash    Medications: I have reviewed the patient's current medications.  Results for orders placed during the hospital encounter of 09/29/11 (from the past 48 hour(s))  CBC     Status: Normal   Collection Time   09/29/11  3:31 AM      Component Value Range Comment   WBC 7.9  4.0 - 10.5 (K/uL)    RBC 4.63  3.87 - 5.11 (MIL/uL)    Hemoglobin 14.0  12.0 - 15.0 (g/dL)    HCT 40.9  81.1 - 91.4 (%)    MCV 89.4  78.0 - 100.0 (fL)    MCH 30.2  26.0 - 34.0 (pg)    MCHC 33.8  30.0 - 36.0 (g/dL)    RDW 78.2  95.6 - 21.3 (%)    Platelets 179  150 - 400 (K/uL)   DIFFERENTIAL     Status:  Abnormal   Collection Time   09/29/11  3:31 AM      Component Value Range Comment   Neutrophils Relative 79 (*) 43 - 77 (%)    Neutro Abs 6.2  1.7 - 7.7 (K/uL)    Lymphocytes Relative 11 (*) 12 - 46 (%)    Lymphs Abs 0.9  0.7 - 4.0 (K/uL)    Monocytes Relative 9  3 - 12 (%)    Monocytes Absolute 0.7  0.1 - 1.0 (K/uL)    Eosinophils Relative 0  0 - 5 (%)    Eosinophils Absolute 0.0  0.0 - 0.7 (K/uL)    Basophils Relative 0  0 - 1 (%)    Basophils Absolute 0.0  0.0 - 0.1 (K/uL)   COMPREHENSIVE METABOLIC PANEL     Status: Abnormal   Collection Time   09/29/11  3:31 AM      Component Value Range Comment   Sodium 136  135 - 145 (mEq/L)    Potassium 4.0  3.5 - 5.1 (mEq/L)    Chloride 95 (*) 96 - 112 (mEq/L)    CO2 33 (*) 19 - 32 (mEq/L)    Glucose, Bld 108 (*) 70 - 99 (mg/dL)  BUN 16  6 - 23 (mg/dL)    Creatinine, Ser 4.09  0.50 - 1.10 (mg/dL)    Calcium 9.4  8.4 - 10.5 (mg/dL)    Total Protein 6.5  6.0 - 8.3 (g/dL)    Albumin 3.5  3.5 - 5.2 (g/dL)    AST 24  0 - 37 (U/L)    ALT 15  0 - 35 (U/L)    Alkaline Phosphatase 60  39 - 117 (U/L)    Total Bilirubin 1.3 (*) 0.3 - 1.2 (mg/dL)    GFR calc non Af Amer 61 (*) >90 (mL/min)    GFR calc Af Amer 71 (*) >90 (mL/min)     Dg Abd Acute W/chest  09/29/2011  *RADIOLOGY REPORT*  Clinical Data: Nausea and vomiting for 4 days.  Lower abdominal pain and diarrhea.  ACUTE ABDOMEN SERIES (ABDOMEN 2 VIEW & CHEST 1 VIEW)  Comparison: Chest radiograph 09/26/2011 from [facility]. CT abdomen and pelvis 09/26/2011 from [facility]. Abdominal radiographs 02/22/2011 from [facility]  Findings: Normal heart size and pulmonary vascularity.  No focal airspace consolidation in the lungs.  No blunting of costophrenic angles.  Central interstitial changes suggest chronic bronchitis. Calcified and tortuous aorta.  Thoracolumbar scoliosis convex towards the left.  No pneumothorax.  Residual contrast material in the colon.  The colon is  decompressed.  Gaseous distension of upper abdominal small bowel loops with air-fluid levels consistent with obstruction as seen on previous CT scan.  Surgical clip in the right upper quadrant.  No free intra-abdominal air.  Degenerative changes in the spine and hips.  IMPRESSION: No evidence of active pulmonary disease.  Dilated gas filled small bowel with air-fluid levels consistent with obstruction.  Contrast material in the colon suggest partial obstruction.  Original Report Authenticated By: Marlon Pel, M.D.    All other review of systems negative or noncontributory except as stated in the HPI  Blood pressure 115/83, pulse 60, temperature 97.9 F (36.6 C), temperature source Oral, resp. rate 16, SpO2 98.00%. General appearance: alert, cooperative and no distress Resp: nonlabored Cardio: normal rate, regular GI: soft, no significant tenderness on exam, mild distension, lower transvers incision and right subcostal incision, she has a bulge in the right groin or at the lateral edge of he scar with valsalva but no evidence of incarceration on exam now.  The hernia is not very evident now. Extremities: extremities normal, atraumatic, no cyanosis or edema Skin: Skin color, texture, turgor normal. No rashes or lesions Neurologic: Grossly normal  Assessment/Plan: Right inguinal hernia and partial SBO Reviewed the patient's records from [facility] in her CT scan then showed evidence of a small bowel traction arising from her right inguinal hernia. She has symptoms of a partial small bowel obstruction and this story for an inguinal hernia but her hernias does not seem to be bulging or incarcerated currently. She has already had an NG tube placed in the emergency room with minimal output. She appears very stable and does not appear ill. However, given her recent admission and readmission, I think that this hernia should probably be fixed sooner than later. I recommended that she be admitted to the  hospital and we attempt to fix this for as soon as possible. I discussed with her the surgery of possible right inguinal hernia repair with possible mesh and possible incisional hernia repair with mesh and the risks and benefits of this procedure. We discussed the risks of infection, bleeding, pain, scarring, recurrence, bowel injury  and need for future surgery and she expressed understanding. We will keep her n.p.o. Until her surgery time.  Lodema Pilot DAVID 09/29/2011, 8:14 AM

## 2011-09-29 NOTE — Progress Notes (Signed)
Pt NPO with PO meds spoke with Dr Biagio Quint advised to clamp NG for a hour while given meds. Annitta Needs, RN

## 2011-09-29 NOTE — ED Notes (Signed)
Surgeon in room  °

## 2011-09-29 NOTE — ED Provider Notes (Signed)
History     CSN: [id number]  Arrival date & time 09/29/11  4098   First MD Initiated Contact with Patient 09/29/11 0235      Chief Complaint  Patient presents with  . Abdominal Pain    HPI  History provided by the patient. Patient is a 71 year old female with history of cholecystectomy, abdominal hysterectomy who presents with complaints of right lower abdomen pain with nausea and vomiting. Patient reports his symptoms began around 10 PM last evening acutely.patient denies any aggravating or alleviating factors. Symptoms were associated with some right lower abdomen distention. Patient denies any fever, chills, sweats, diarrhea or constipation Patient reports having similar symptoms recently. She has been evaluated to any pain in the emergency room and was told that she had a hernia in her right lower abdomen and inguinal area. Patient has an upcoming appointment with the general surgeon this Thursday for further evaluation. Patient was told to come to the emergency room she had return of pain or nausea vomiting symptoms.    Past Medical History  Diagnosis Date  . Grave's disease   . Cancer   . Depression     Past Surgical History  Procedure Date  . Abdominal hysterectomy   . Cholecystectomy   . Eye surgery     No family history on file.  History  Substance Use Topics  . Smoking status: Current Everyday Smoker  . Smokeless tobacco: Not on file  . Alcohol Use: No    OB History    Grav Para Term Preterm Abortions TAB SAB Ect Mult Living                  Review of Systems  Constitutional: Negative for fever and chills.  Gastrointestinal: Positive for nausea, vomiting, abdominal pain and abdominal distention. Negative for diarrhea and constipation.  Genitourinary: Negative for dysuria, frequency, hematuria and flank pain.    Allergies  Sulfa antibiotics  Home Medications   Current Outpatient Rx  Name Route Sig Dispense Refill  . CALTRATE 600 PLUS-VIT D PO Oral  Take 1 tablet by mouth daily.    . CHOLECALCIFEROL 1000 UNITS PO CAPS Oral Take 1,000 Units by mouth daily.    Marland Kitchen ESCITALOPRAM OXALATE 20 MG PO TABS Oral Take 40 mg by mouth daily.    . FUROSEMIDE 20 MG PO TABS Oral Take 20 mg by mouth daily.    Marland Kitchen LEVOTHYROXINE SODIUM 125 MCG PO TABS Oral Take 125 mcg by mouth daily.    Marland Kitchen POTASSIUM CHLORIDE ER 10 MEQ PO TBCR Oral Take 10 mEq by mouth daily. Take 1 tablet on Monday, Tuesday, and Wednesday only. Do not take Thursday, Friday, Saturday, Sunday    . PRAVASTATIN SODIUM 80 MG PO TABS Oral Take 80 mg by mouth daily.      BP 129/71  Pulse 69  Temp(Src) 98.1 F (36.7 C) (Oral)  Resp 17  SpO2 98%  Physical Exam  Nursing note and vitals reviewed. Constitutional: She is oriented to person, place, and time. She appears well-developed and well-nourished. No distress.  HENT:  Head: Normocephalic.  Cardiovascular: Normal rate and regular rhythm.   Pulmonary/Chest: Effort normal and breath sounds normal.  Abdominal: Soft. There is tenderness.  Neurological: She is alert and oriented to person, place, and time.  Skin: Skin is warm and dry. No rash noted.  Psychiatric: She has a normal mood and affect. Her behavior is normal.    ED Course  Procedures   Results for orders placed during  the hospital encounter of 09/29/11  CBC      Component Value Range   WBC 7.9  4.0 - 10.5 (K/uL)   RBC 4.63  3.87 - 5.11 (MIL/uL)   Hemoglobin 14.0  12.0 - 15.0 (g/dL)   HCT 16.1  09.6 - 04.5 (%)   MCV 89.4  78.0 - 100.0 (fL)   MCH 30.2  26.0 - 34.0 (pg)   MCHC 33.8  30.0 - 36.0 (g/dL)   RDW 40.9  81.1 - 91.4 (%)   Platelets 179  150 - 400 (K/uL)  DIFFERENTIAL      Component Value Range   Neutrophils Relative 79 (*) 43 - 77 (%)   Neutro Abs 6.2  1.7 - 7.7 (K/uL)   Lymphocytes Relative 11 (*) 12 - 46 (%)   Lymphs Abs 0.9  0.7 - 4.0 (K/uL)   Monocytes Relative 9  3 - 12 (%)   Monocytes Absolute 0.7  0.1 - 1.0 (K/uL)   Eosinophils Relative 0  0 - 5 (%)    Eosinophils Absolute 0.0  0.0 - 0.7 (K/uL)   Basophils Relative 0  0 - 1 (%)   Basophils Absolute 0.0  0.0 - 0.1 (K/uL)  COMPREHENSIVE METABOLIC PANEL      Component Value Range   Sodium 136  135 - 145 (mEq/L)   Potassium 4.0  3.5 - 5.1 (mEq/L)   Chloride 95 (*) 96 - 112 (mEq/L)   CO2 33 (*) 19 - 32 (mEq/L)   Glucose, Bld 108 (*) 70 - 99 (mg/dL)   BUN 16  6 - 23 (mg/dL)   Creatinine, Ser 7.82  0.50 - 1.10 (mg/dL)   Calcium 9.4  8.4 - 95.6 (mg/dL)   Total Protein 6.5  6.0 - 8.3 (g/dL)   Albumin 3.5  3.5 - 5.2 (g/dL)   AST 24  0 - 37 (U/L)   ALT 15  0 - 35 (U/L)   Alkaline Phosphatase 60  39 - 117 (U/L)   Total Bilirubin 1.3 (*) 0.3 - 1.2 (mg/dL)   GFR calc non Af Amer 61 (*) >90 (mL/min)   GFR calc Af Amer 71 (*) >90 (mL/min)      Dg Abd Acute W/chest  09/29/2011  *RADIOLOGY REPORT*  Clinical Data: Nausea and vomiting for 4 days.  Lower abdominal pain and diarrhea.  ACUTE ABDOMEN SERIES (ABDOMEN 2 VIEW & CHEST 1 VIEW)  Comparison: Chest radiograph 09/26/2011 from [facility]. CT abdomen and pelvis 09/26/2011 from [facility]. Abdominal radiographs 02/22/2011 from [facility].  Findings: Normal heart size and pulmonary vascularity.  No focal airspace consolidation in the lungs.  No blunting of costophrenic angles.  Central interstitial changes suggest chronic bronchitis. Calcified and tortuous aorta.  Thoracolumbar scoliosis convex towards the left.  No pneumothorax.  Residual contrast material in the colon.  The colon is decompressed.  Gaseous distension of upper abdominal small bowel loops with air-fluid levels consistent with obstruction as seen on previous CT scan.  Surgical clip in the right upper quadrant.  No free intra-abdominal air.  Degenerative changes in the spine and hips.  IMPRESSION: No evidence of active pulmonary disease.  Dilated gas filled small bowel with air-fluid levels consistent with obstruction.  Contrast material in the colon suggest partial  obstruction.  Original Report Authenticated By: Marlon Pel, M.D.     1. Small bowel obstruction   2. Inguinal hernia       MDM   patient seen and evaluated. Patient in no  acute distress.   Pt discussed with Attending Physician.  Will consult surgery.  will place NG tube  Spoke with ORs nurse for Dr. Emilee Hero. Dr. Emilee Hero is in the middle of surgery but will plan for patient to be seen by surgery either by himself or colleague this morning.     Angus Seller, [place] 09/29/11 [phone]

## 2011-09-29 NOTE — Op Note (Signed)
Preoperative diagnosis: Incarcerated right inguinal hernia  Postoperative diagnosis: SAA Procedure: Right inguinal hernia repair with UltraPro mesh patch  Surgeon: Dr. Harden Mo  Anes: GETA EBL: minimal Drains: none Specimen: none  complications: None Sponge and needle count correct at end of operation x2 Disposition to recovery in stable condition  Indications: This is a 70 year old female who has a prior history of a hysterectomy who presented several days ago at an outside hospital. She had what appeared to be a bowel obstruction from an incarcerated right inguinal hernia at that time. Her hernia was reduced and she was discharged home the following day. She continued to have nausea, vomiting, and abdominal pain at home. She presented to the emergency room here overnight where she had plain films that showed that she had persistently dilated small bowel but had moved her contrast in her colon. I think she continued to have a partial obstruction. She continued to be nauseated and was having right groin pain. I recommended her going to the operating room and to repair a right inguinal hernia. I told her if there is any evidence that her bowel is compromised I would possibly place a laparoscope at the same time.  Procedure: After informed consent was obtained the patient was taken to the operating room. 1 g of intravenous cefazolin was administered. Sequential compression devices were placed her legs prior to beginning.she was placed under general endotracheal anesthesia without complication. Her abdomen and groin were then prepped and draped in the standard sterile surgical fashion. Surgical timeout was performed.  I made a right groin incision and carried this out through Scarpa's fascia down to the external oblique. This was entered sharply through the external ring. I then encircled the round ligament. There was a fairly large indirect hernia sac present. The floor of her inguinal canal was  also weak but there was no obvious direct hernia. I then dissected the hernia sac free. I opened the hernia sac which was thickened. There was no evidence of any bowel present at that time. There was only some clear fluid present I decided not to open the floor further tied also decided not to place a laparoscope as I do not think she has any compromise of her bowel at this time. I then ligated her hernia sac with 2-0 Vicryl and excised the excess. I placed this back in the abdomen. I decided to ligate the round ligament with 2-0 Vicryl as well. I then closed the internal ring by ring the shelving edge down to the internal oblique. This completely obliterated the space. She did not have a femoral hernia that I can identify either. I then placed a piece of UltraPro mesh overlying the entire floor. This was sutured into numerous positions to the pubic tubercle as well as the inguinal ligament inferiorly with 2-0 Prolene suture. I tacked this to the internal oblique superiorly with 2-0 Prolene. This completely covered the floor. Hemostasis was observed. I then closed the external oblique with 2-0 Vicryl. Scarpa's fascia was closed with 3-0 Vicryl the skin was closed with 4 Monocryl in a subcuticular fashion. I infiltrated a total of 30 cc of quarter percent Marcaine throughout the right groin as well as performing an ilioinguinal nerve block. Dermabond was placed over the wound. She tolerated this well, was extubated in the operating room and transferred to pacu stable.

## 2011-09-29 NOTE — Interval H&P Note (Signed)
History and Physical Interval Note:  09/29/2011 2:05 PM I have seen and evaluated patient.  I have reviewed her scan and her films.  She has rih that had bowel obstruction and never was normal despite being sent home.  She needs her hernia repaired today and to ensure her bowel is not remaining incarcerated leading to partial sbo.  I have discussed the procedure and risks associated with it with the patient. Victoria Sims  has presented today for surgery, with the diagnosis of right inquinal hernia  The various methods of treatment have been discussed with the patient and family. After consideration of risks, benefits and other options for treatment, the patient has consented to  Procedure(s) (LRB): HERNIA REPAIR INGUINAL ADULT (Right) LAPAROSCOPY DIAGNOSTIC (N/A) as a surgical intervention .  The patients' history has been reviewed, patient examined, no change in status, stable for surgery.  I have reviewed the patients' chart and labs.  Questions were answered to the patient's satisfaction.     [name]

## 2011-09-30 ENCOUNTER — Encounter (HOSPITAL_COMMUNITY): Payer: Self-pay | Admitting: *Deleted

## 2011-09-30 MED ORDER — PANTOPRAZOLE SODIUM 40 MG IV SOLR
40.0000 mg | Freq: Every day | INTRAVENOUS | Status: DC
  Administered 2011-09-30 – 2011-10-01 (×2): 40 mg via INTRAVENOUS
  Filled 2011-09-30 (×2): qty 40

## 2011-09-30 NOTE — Progress Notes (Signed)
1 Day Post-Op  Subjective: Feels well, no n/v, pain controlled  Objective: Vital signs in last 24 hours: Temp:  [97.8 F (36.6 C)-99.4 F (37.4 C)] 99 F (37.2 C) (05/26 0946) Pulse Rate:  [52-66] 53  (05/26 0946) Resp:  [14-18] 18  (05/26 0946) BP: (96-120)/(52-80) 103/56 mmHg (05/26 0946) SpO2:  [99 %-100 %] 100 % (05/26 0946) Weight:  [123 lb (55.792 kg)] 123 lb (55.792 kg) (05/26 0648) Last BM Date: 09/29/11  Intake/Output from previous day: 05/25 0701 - 05/26 0700 In: 3580.4 [P.O.:120; I.V.:3460.4] Out: 815 [Urine:750; Emesis/NG output:40; Blood:25] Intake/Output this shift: Total I/O In: -  Out: 300 [Urine:300]  General appearance: no distress GI: soft nontender nondistended bs present, right groin incision well healed  Lab Results:   Basename 09/29/11 0331  WBC 7.9  HGB 14.0  HCT 41.4  PLT 179   BMET  Basename 09/29/11 0331  NA 136  K 4.0  CL 95*  CO2 33*  GLUCOSE 108*  BUN 16  CREATININE 0.94  CALCIUM 9.4   PT/INR No results found for this basename: LABPROT:2,INR:2 in the last 72 hours ABG No results found for this basename: PHART:2,PCO2:2,PO2:2,HCO3:2 in the last 72 hours  Studies/Results: Dg Abd Acute W/chest  09/29/2011  *RADIOLOGY REPORT*  Clinical Data: Nausea and vomiting for 4 days.  Lower abdominal pain and diarrhea.  ACUTE ABDOMEN SERIES (ABDOMEN 2 VIEW & CHEST 1 VIEW)  Comparison: Chest radiograph 09/26/2011 from [facility]. CT abdomen and pelvis 09/26/2011 from [facility]. Abdominal radiographs 02/22/2011 from [facility].  Findings: Normal heart size and pulmonary vascularity.  No focal airspace consolidation in the lungs.  No blunting of costophrenic angles.  Central interstitial changes suggest chronic bronchitis. Calcified and tortuous aorta.  Thoracolumbar scoliosis convex towards the left.  No pneumothorax.  Residual contrast material in the colon.  The colon is decompressed.  Gaseous distension of upper  abdominal small bowel loops with air-fluid levels consistent with obstruction as seen on previous CT scan.  Surgical clip in the right upper quadrant.  No free intra-abdominal air.  Degenerative changes in the spine and hips.  IMPRESSION: No evidence of active pulmonary disease.  Dilated gas filled small bowel with air-fluid levels consistent with obstruction.  Contrast material in the colon suggest partial obstruction.  Original Report Authenticated By: Marlon Pel, M.D.    Anti-infectives: Anti-infectives     Start     Dose/Rate Route Frequency Ordered Stop   09/29/11 1230   ceFAZolin (ANCEF) IVPB 1 g/50 mL premix        1 g 100 mL/hr over 30 Minutes Intravenous  Once 09/29/11 1149 09/29/11 1449          Assessment/Plan: POD 1 RIH  Advance diet, pulm toilet, dc foley   LOS: 1 day    [facility] 09/30/2011

## 2011-10-01 MED ORDER — OXYCODONE-ACETAMINOPHEN 5-325 MG PO TABS: 1.0000 | ORAL_TABLET | ORAL | Status: AC | PRN

## 2011-10-01 MED ORDER — PANTOPRAZOLE SODIUM 40 MG PO TBEC: 40.0000 mg | DELAYED_RELEASE_TABLET | Freq: Every day | ORAL | Status: DC

## 2011-10-01 MED ORDER — OXYCODONE-ACETAMINOPHEN 5-325 MG PO TABS: 1.0000 | ORAL_TABLET | ORAL | Status: DC | PRN

## 2011-10-01 NOTE — Progress Notes (Signed)
Discharge instructions reviewed with patient and friend, vital signs are stable, incision to patient's abdomen are within normal limits, patient has tolerated regular diet without complaints of nausea or vomiting, questions and concerns answered, prescription given, patient to follow up with MD Dwain Sarna within 3 weeks Means, Myrtie Hawk RN 10-01-11 12:49pm

## 2011-10-01 NOTE — Progress Notes (Signed)
The patient is receiving protonix by the intravenous route.  Based on criteria approved by the Pharmacy and Therapeutics Committee and the Medical Executive Committee, the medication is being converted to the equivalent oral dose form.  These criteria include: -No Active GI bleeding -Able to tolerate diet of full liquids (or better) or tube feeding -Able to tolerate other medications by the oral or enteral route  If you have any questions about this conversion, please contact the Pharmacy Department (ext [phone]).  Thank you.  Victoria Sims, [facility] 10/01/2011 10:46 AM

## 2011-10-01 NOTE — Discharge Instructions (Signed)
[facility] ° °UMBILICAL OR INGUINAL HERNIA REPAIR: POST OP INSTRUCTIONS ° °Always review your discharge instruction sheet given to you by the facility where your surgery was performed. °IF YOU HAVE DISABILITY OR FAMILY LEAVE FORMS, YOU MUST BRING THEM TO THE OFFICE FOR PROCESSING.   °DO NOT GIVE THEM TO YOUR DOCTOR. ° °1. A  prescription for pain medication may be given to you upon discharge.  Take your pain medication as prescribed, if needed.  If narcotic pain medicine is not needed, then you may take acetaminophen (Tylenol), naprosyn (Alleve) or ibuprofen (Advil) as needed. °2. Take your usually prescribed medications unless otherwise directed. °3. If you need a refill on your pain medication, please contact your pharmacy.  They will contact our office to request authorization. Prescriptions will not be filled after 5 pm or on week-ends. °4. You should follow a light diet the first 24 hours after arrival home, such as soup and crackers, etc.  Be sure to include lots of fluids daily.  Resume your normal diet the day after surgery. °5. Most patients will experience some swelling and bruising around the umbilicus or in the groin and scrotum.  Ice packs and reclining will help.  Swelling and bruising can take several days to resolve.  °6. It is common to experience some constipation if taking pain medication after surgery.  Increasing fluid intake and taking a stool softener (such as Colace) will usually help or prevent this problem from occurring.  A mild laxative (Milk of Magnesia or Miralax) should be taken according to package directions if there are no bowel movements after 48 hours. °7. Unless discharge instructions indicate otherwise, you may remove your bandages 48 hours after surgery, and you may shower at that time.  You may have steri-strips (small skin tapes) in place directly over the incision.  These strips should be left on the skin for 7-10 days and will come off on their own.   If your surgeon used skin glue on the incision, you may shower in 24 hours.  The glue will flake off over the next 2-3 weeks.  Any sutures or staples will be removed at the office during your follow-up visit. °8. ACTIVITIES:  You may resume regular (light) daily activities beginning the next day--such as daily self-care, walking, climbing stairs--gradually increasing activities as tolerated.  You may have sexual intercourse when it is comfortable.  Refrain from any heavy lifting or straining until approved by your doctor. °a. You may drive when you are no longer taking prescription pain medication, you can comfortably wear a seatbelt, and you can safely maneuver your car and apply brakes. °b. RETURN TO WORK:  __________________________________________________________ °9. You should see your doctor in the office for a follow-up appointment approximately 2-3 weeks after your surgery.  Make sure that you call for this appointment within a day or two after you arrive home to insure a convenient appointment time. °10. OTHER INSTRUCTIONS:  __________________________________________________________________________________________________________________________________________________________________________________________  °WHEN TO CALL YOUR DOCTOR: °1. Fever over 101.0 °2. Inability to urinate °3. Nausea and/or vomiting °4. Extreme swelling or bruising °5. Continued bleeding from incision. °6. Increased pain, redness, or drainage from the incision ° °The clinic staff is available to answer your questions during regular business hours.  Please don’t hesitate to call and ask to speak to one of the nurses for clinical concerns.  If you have a medical emergency, go to the nearest emergency room or call 911.  A surgeon from [facility]   is always on call at the hospital ° ° °[address], [address], [place], [place]  [address] ? ° [address] [address], [place], [place]   [address] °[phone] ? [phone] ? FAX  [phone] °Web site: [email or web address] ° ° °

## 2011-10-01 NOTE — Progress Notes (Signed)
2 Days Post-Op  Subjective: Doing well, passing flatus  Objective: Vital signs in last 24 hours: Temp:  [97.9 F (36.6 C)-99.1 F (37.3 C)] 98.9 F (37.2 C) (05/27 0457) Pulse Rate:  [56-68] 56  (05/27 0925) Resp:  [16-18] 16  (05/27 0457) BP: (85-106)/(44-53) 96/51 mmHg (05/27 0925) SpO2:  [96 %-99 %] 97 % (05/27 0457) Last BM Date: 09/29/11  Intake/Output from previous day: 05/26 0701 - 05/27 0700 In: 2718.3 [P.O.:760; I.V.:1958.3] Out: 2450 [Urine:2450] Intake/Output this shift: Total I/O In: -  Out: 650 [Urine:650]  General appearance: no distress GI: soft, nondistended, bs present, right groin incision clean  Lab Results:   Basename 09/29/11 0331  WBC 7.9  HGB 14.0  HCT 41.4  PLT 179   BMET  Basename 09/29/11 0331  NA 136  K 4.0  CL 95*  CO2 33*  GLUCOSE 108*  BUN 16  CREATININE 0.94  CALCIUM 9.4   PT/INR No results found for this basename: LABPROT:2,INR:2 in the last 72 hours ABG No results found for this basename: PHART:2,PCO2:2,PO2:2,HCO3:2 in the last 72 hours  Studies/Results: No results found.  Anti-infectives: Anti-infectives     Start     Dose/Rate Route Frequency Ordered Stop   09/29/11 1230   ceFAZolin (ANCEF) IVPB 1 g/50 mL premix        1 g 100 mL/hr over 30 Minutes Intravenous  Once 09/29/11 1149 09/29/11 1449          Assessment/Plan: S/p RIH Will dc home today if tolerates lunch   LOS: 2 days    [facility] 10/01/2011

## 2011-10-02 NOTE — Progress Notes (Signed)
UR complete 

## 2011-10-03 ENCOUNTER — Encounter (HOSPITAL_COMMUNITY): Payer: Self-pay | Admitting: General Surgery

## 2011-10-03 NOTE — Discharge Summary (Signed)
Physician Discharge Summary  Patient ID: Victoria Sims MRN: [id number] DOB/AGE: [birth year] 69 y.o.  Admit date: 09/29/2011 Discharge date: 10/03/2011  Admission Diagnoses: RIH with prior bowel obstruction  Discharge Diagnoses:  Active Problems:  * No active hospital problems. *    Discharged Condition: good  Hospital Course: 20 yof who was recently admitted to outside hospital with bowel obstruction from inguinal hernia.  She was reduced and sent home where she never returned to normal. She presented here with continued right groin pain.  She was taken to OR and underwent open RIH.  Postoperatively she began having flatus and was tolerating a regular diet.  She was discharged home ambulating well.  Consults: None  Significant Diagnostic Studies: none  Treatments: surgery: right inguinal hernia repair with mesh   Disposition: 01-Home or Self Care  Discharge Orders    Future Appointments: Provider: Department: Dept Phone: Center:   10/16/2011 2:20 PM Emelia Loron, MD [facility] [phone] None     Medication List  As of 10/03/2011  3:51 PM   TAKE these medications         CALTRATE 600 PLUS-VIT D PO   Take 1 tablet by mouth daily.      D3-1000 1000 UNITS capsule   Generic drug: Cholecalciferol   Take 1,000 Units by mouth daily.      escitalopram 20 MG tablet   Commonly known as: LEXAPRO   Take 40 mg by mouth daily.      furosemide 20 MG tablet   Commonly known as: LASIX   Take 20 mg by mouth daily.      levothyroxine 125 MCG tablet   Commonly known as: SYNTHROID, LEVOTHROID   Take 125 mcg by mouth daily.      oxyCODONE-acetaminophen 5-325 MG per tablet   Commonly known as: PERCOCET   Take 1-2 tablets by mouth every 4 (four) hours as needed.      potassium chloride 10 MEQ tablet   Commonly known as: K-DUR   Take 10 mEq by mouth daily. Take 1 tablet on Monday, Tuesday, and Wednesday only. Do not take Thursday, Friday, Saturday, Sunday     pravastatin 80 MG tablet   Commonly known as: PRAVACHOL   Take 80 mg by mouth daily.           Follow-up Information    Follow up with [facility], [place]. Schedule an appointment as soon as possible for a visit in 3 weeks.   Contact information:   [employer] [facility] [address] [place] [address] [phone]          Signed: Emelia Loron 10/03/2011, 3:51 PM

## 2011-10-04 ENCOUNTER — Encounter (INDEPENDENT_AMBULATORY_CARE_PROVIDER_SITE_OTHER): Payer: Self-pay | Admitting: Surgery

## 2011-10-16 ENCOUNTER — Encounter (INDEPENDENT_AMBULATORY_CARE_PROVIDER_SITE_OTHER): Payer: Self-pay | Admitting: General Surgery

## 2011-10-16 ENCOUNTER — Ambulatory Visit (INDEPENDENT_AMBULATORY_CARE_PROVIDER_SITE_OTHER): Payer: Medicare Other | Admitting: General Surgery

## 2011-10-16 VITALS — BP 100/62 | HR 74 | Temp 97.2°F | Resp 14 | Ht 61.5 in | Wt 126.0 lb

## 2011-10-16 DIAGNOSIS — Z09 Encounter for follow-up examination after completed treatment for conditions other than malignant neoplasm: Secondary | ICD-10-CM

## 2011-10-16 NOTE — Progress Notes (Signed)
Subjective:     Patient ID: MARGI EDMUNDSON, female   DOB: 04-24-1941, 71 y.o.   MRN: [id number]  HPI This is a 71 year old female who was admitted to [facility] with a bowel obstruction and a right inguinal hernia. I took her to the operating room and did a right inguinal hernia repair with mesh. She was discharged home 2 days postoperatively. She returns today complaining of some pain in her groin but she is otherwise doing well.  Review of Systems     Objective:   Physical Exam Healing right groin incision without infection    Assessment:     S/p RIH     Plan:     She can return to full activity in 2 weeks Will see her back as needed

## 2011-10-22 ENCOUNTER — Encounter (INDEPENDENT_AMBULATORY_CARE_PROVIDER_SITE_OTHER): Payer: Self-pay | Admitting: Surgery

## 2011-10-30 DIAGNOSIS — F331 Major depressive disorder, recurrent, moderate: Secondary | ICD-10-CM | POA: Diagnosis not present

## 2011-11-14 DIAGNOSIS — R5381 Other malaise: Secondary | ICD-10-CM | POA: Diagnosis not present

## 2011-11-14 DIAGNOSIS — E7800 Pure hypercholesterolemia, unspecified: Secondary | ICD-10-CM | POA: Diagnosis not present

## 2011-11-14 DIAGNOSIS — R5383 Other fatigue: Secondary | ICD-10-CM | POA: Diagnosis not present

## 2011-11-14 DIAGNOSIS — E559 Vitamin D deficiency, unspecified: Secondary | ICD-10-CM | POA: Diagnosis not present

## 2011-11-14 DIAGNOSIS — N390 Urinary tract infection, site not specified: Secondary | ICD-10-CM | POA: Diagnosis not present

## 2011-11-20 DIAGNOSIS — F329 Major depressive disorder, single episode, unspecified: Secondary | ICD-10-CM | POA: Diagnosis not present

## 2011-11-20 DIAGNOSIS — F331 Major depressive disorder, recurrent, moderate: Secondary | ICD-10-CM | POA: Diagnosis not present

## 2011-11-20 DIAGNOSIS — N390 Urinary tract infection, site not specified: Secondary | ICD-10-CM | POA: Diagnosis not present

## 2011-11-20 DIAGNOSIS — E039 Hypothyroidism, unspecified: Secondary | ICD-10-CM | POA: Diagnosis not present

## 2011-11-20 DIAGNOSIS — E559 Vitamin D deficiency, unspecified: Secondary | ICD-10-CM | POA: Diagnosis not present

## 2011-12-19 DIAGNOSIS — F331 Major depressive disorder, recurrent, moderate: Secondary | ICD-10-CM | POA: Diagnosis not present

## 2012-01-02 DIAGNOSIS — E039 Hypothyroidism, unspecified: Secondary | ICD-10-CM | POA: Diagnosis not present

## 2012-01-02 DIAGNOSIS — N8111 Cystocele, midline: Secondary | ICD-10-CM | POA: Diagnosis not present

## 2012-01-16 DIAGNOSIS — F331 Major depressive disorder, recurrent, moderate: Secondary | ICD-10-CM | POA: Diagnosis not present

## 2012-02-06 ENCOUNTER — Ambulatory Visit: Payer: Medicare Other | Admitting: Licensed Clinical Social Worker

## 2012-02-20 ENCOUNTER — Ambulatory Visit (INDEPENDENT_AMBULATORY_CARE_PROVIDER_SITE_OTHER): Payer: Medicare Other | Admitting: Licensed Clinical Social Worker

## 2012-02-20 DIAGNOSIS — IMO0002 Reserved for concepts with insufficient information to code with codable children: Secondary | ICD-10-CM

## 2012-03-05 ENCOUNTER — Ambulatory Visit: Payer: Medicare Other | Admitting: Licensed Clinical Social Worker

## 2012-03-12 ENCOUNTER — Ambulatory Visit (INDEPENDENT_AMBULATORY_CARE_PROVIDER_SITE_OTHER): Payer: Medicare Other | Admitting: Licensed Clinical Social Worker

## 2012-03-12 DIAGNOSIS — IMO0002 Reserved for concepts with insufficient information to code with codable children: Secondary | ICD-10-CM

## 2012-03-14 DIAGNOSIS — Z23 Encounter for immunization: Secondary | ICD-10-CM | POA: Diagnosis not present

## 2012-03-18 DIAGNOSIS — E0500 Thyrotoxicosis with diffuse goiter without thyrotoxic crisis or storm: Secondary | ICD-10-CM | POA: Insufficient documentation

## 2012-03-18 DIAGNOSIS — H2510 Age-related nuclear cataract, unspecified eye: Secondary | ICD-10-CM | POA: Diagnosis not present

## 2012-03-18 DIAGNOSIS — H3530 Unspecified macular degeneration: Secondary | ICD-10-CM | POA: Diagnosis not present

## 2012-03-18 DIAGNOSIS — D3130 Benign neoplasm of unspecified choroid: Secondary | ICD-10-CM | POA: Diagnosis not present

## 2012-03-18 DIAGNOSIS — H01009 Unspecified blepharitis unspecified eye, unspecified eyelid: Secondary | ICD-10-CM | POA: Diagnosis not present

## 2012-03-26 ENCOUNTER — Ambulatory Visit (INDEPENDENT_AMBULATORY_CARE_PROVIDER_SITE_OTHER): Payer: Medicare Other | Admitting: Licensed Clinical Social Worker

## 2012-03-26 DIAGNOSIS — IMO0002 Reserved for concepts with insufficient information to code with codable children: Secondary | ICD-10-CM

## 2012-03-26 DIAGNOSIS — Z01419 Encounter for gynecological examination (general) (routine) without abnormal findings: Secondary | ICD-10-CM | POA: Diagnosis not present

## 2012-03-26 DIAGNOSIS — N8111 Cystocele, midline: Secondary | ICD-10-CM | POA: Diagnosis not present

## 2012-04-09 DIAGNOSIS — M1710 Unilateral primary osteoarthritis, unspecified knee: Secondary | ICD-10-CM | POA: Diagnosis not present

## 2012-04-16 ENCOUNTER — Ambulatory Visit (INDEPENDENT_AMBULATORY_CARE_PROVIDER_SITE_OTHER): Payer: Medicare Other | Admitting: Licensed Clinical Social Worker

## 2012-04-16 DIAGNOSIS — IMO0002 Reserved for concepts with insufficient information to code with codable children: Secondary | ICD-10-CM

## 2012-04-23 DIAGNOSIS — D485 Neoplasm of uncertain behavior of skin: Secondary | ICD-10-CM | POA: Diagnosis not present

## 2012-04-23 DIAGNOSIS — M25519 Pain in unspecified shoulder: Secondary | ICD-10-CM | POA: Diagnosis not present

## 2012-04-23 DIAGNOSIS — D234 Other benign neoplasm of skin of scalp and neck: Secondary | ICD-10-CM | POA: Diagnosis not present

## 2012-04-23 DIAGNOSIS — L408 Other psoriasis: Secondary | ICD-10-CM | POA: Diagnosis not present

## 2012-04-23 DIAGNOSIS — L570 Actinic keratosis: Secondary | ICD-10-CM | POA: Diagnosis not present

## 2012-04-23 DIAGNOSIS — D235 Other benign neoplasm of skin of trunk: Secondary | ICD-10-CM | POA: Diagnosis not present

## 2012-05-14 ENCOUNTER — Ambulatory Visit: Payer: Medicare Other | Admitting: Licensed Clinical Social Worker

## 2012-05-21 ENCOUNTER — Ambulatory Visit (INDEPENDENT_AMBULATORY_CARE_PROVIDER_SITE_OTHER): Payer: Medicare Other | Admitting: Licensed Clinical Social Worker

## 2012-05-21 DIAGNOSIS — IMO0002 Reserved for concepts with insufficient information to code with codable children: Secondary | ICD-10-CM | POA: Diagnosis not present

## 2012-05-21 DIAGNOSIS — L723 Sebaceous cyst: Secondary | ICD-10-CM | POA: Diagnosis not present

## 2012-06-04 ENCOUNTER — Ambulatory Visit: Payer: Medicare Other | Admitting: Licensed Clinical Social Worker

## 2012-06-11 ENCOUNTER — Ambulatory Visit (INDEPENDENT_AMBULATORY_CARE_PROVIDER_SITE_OTHER): Payer: Medicare Other | Admitting: Licensed Clinical Social Worker

## 2012-06-11 DIAGNOSIS — IMO0002 Reserved for concepts with insufficient information to code with codable children: Secondary | ICD-10-CM | POA: Diagnosis not present

## 2012-06-17 ENCOUNTER — Other Ambulatory Visit: Payer: Self-pay | Admitting: Oncology

## 2012-06-17 DIAGNOSIS — Z853 Personal history of malignant neoplasm of breast: Secondary | ICD-10-CM

## 2012-06-17 DIAGNOSIS — Z9889 Other specified postprocedural states: Secondary | ICD-10-CM

## 2012-06-25 ENCOUNTER — Ambulatory Visit: Payer: Medicare Other | Admitting: Licensed Clinical Social Worker

## 2012-07-02 DIAGNOSIS — D179 Benign lipomatous neoplasm, unspecified: Secondary | ICD-10-CM | POA: Diagnosis not present

## 2012-07-09 ENCOUNTER — Ambulatory Visit: Payer: Medicare Other | Admitting: Licensed Clinical Social Worker

## 2012-07-22 ENCOUNTER — Encounter: Payer: Self-pay | Admitting: Obstetrics & Gynecology

## 2012-07-23 ENCOUNTER — Ambulatory Visit
Admission: RE | Admit: 2012-07-23 | Discharge: 2012-07-23 | Disposition: A | Discharge status: Home / Self Care | Payer: Medicare Other | Source: Ambulatory Visit | Attending: Oncology | Admitting: Oncology

## 2012-07-23 ENCOUNTER — Ambulatory Visit (INDEPENDENT_AMBULATORY_CARE_PROVIDER_SITE_OTHER): Payer: Medicare Other | Admitting: Licensed Clinical Social Worker

## 2012-07-23 ENCOUNTER — Ambulatory Visit (INDEPENDENT_AMBULATORY_CARE_PROVIDER_SITE_OTHER): Payer: Medicare Other | Admitting: Obstetrics & Gynecology

## 2012-07-23 ENCOUNTER — Encounter: Payer: Self-pay | Admitting: Obstetrics & Gynecology

## 2012-07-23 VITALS — BP 104/68 | HR 60 | Resp 12

## 2012-07-23 DIAGNOSIS — Z853 Personal history of malignant neoplasm of breast: Secondary | ICD-10-CM | POA: Diagnosis not present

## 2012-07-23 DIAGNOSIS — Z9889 Other specified postprocedural states: Secondary | ICD-10-CM

## 2012-07-23 MED ORDER — METRONIDAZOLE 0.75 % VA GEL: 1.0000 | Freq: Every day | VAGINAL | Status: DC

## 2012-07-23 NOTE — Progress Notes (Signed)
72 y.o. Married Caucasian female G0P0000 here for pessary check.  Patient has been using following pessary style and size:  #4 incontinence ring with support.  She is sexually active.  She describes the following issues with the pessary:   Discharge with some odor.  No pain.  No dysuria.    ROS:  Vaginal bleeding:  no  Vaginal pressure:  no  Vaginal discharge:  yes  Vaginal odor:  yes  Urinary incontinence:  no  Other:  none Exam:   BP 104/68  Pulse 60  Resp 12 General appearance: alert and cooperative Inguinal adenopathy: none   Pelvic: External genitalia:  no lesions and atrophic appearance              Urethra: not indicated and normal appearing urethra with no masses, tenderness or lesions              Bartholins and Skenes: Bartholin's, Urethra, Skene's normal                 Vagina: normal appearing vagina with normal color and discharge, atrophic, excoriation at cuff due to pessary, second degree enterocele              Cervix: absent              Pessary was removed without difficulty.  Pessary was cleansed.  Pessary was replaced. Patient tolerated procedure well.    A: Enterocele Vaginal discharge     P:  Return to office in 3 months for recheck.       Metrogel 0.75% qhs x 7 days.    An After Visit Summary was printed and given to the patient.

## 2012-08-06 ENCOUNTER — Ambulatory Visit: Payer: Medicare Other | Admitting: Obstetrics & Gynecology

## 2012-08-06 DIAGNOSIS — E7800 Pure hypercholesterolemia, unspecified: Secondary | ICD-10-CM | POA: Diagnosis not present

## 2012-08-06 DIAGNOSIS — F329 Major depressive disorder, single episode, unspecified: Secondary | ICD-10-CM | POA: Diagnosis not present

## 2012-08-06 DIAGNOSIS — N390 Urinary tract infection, site not specified: Secondary | ICD-10-CM | POA: Diagnosis not present

## 2012-08-06 DIAGNOSIS — E039 Hypothyroidism, unspecified: Secondary | ICD-10-CM | POA: Diagnosis not present

## 2012-08-06 DIAGNOSIS — M810 Age-related osteoporosis without current pathological fracture: Secondary | ICD-10-CM | POA: Diagnosis not present

## 2012-08-06 DIAGNOSIS — E559 Vitamin D deficiency, unspecified: Secondary | ICD-10-CM | POA: Diagnosis not present

## 2012-08-13 ENCOUNTER — Ambulatory Visit (INDEPENDENT_AMBULATORY_CARE_PROVIDER_SITE_OTHER): Payer: Medicare Other | Admitting: Licensed Clinical Social Worker

## 2012-08-13 DIAGNOSIS — N390 Urinary tract infection, site not specified: Secondary | ICD-10-CM | POA: Diagnosis not present

## 2012-08-13 DIAGNOSIS — E039 Hypothyroidism, unspecified: Secondary | ICD-10-CM | POA: Diagnosis not present

## 2012-08-13 DIAGNOSIS — IMO0002 Reserved for concepts with insufficient information to code with codable children: Secondary | ICD-10-CM

## 2012-08-13 DIAGNOSIS — E7800 Pure hypercholesterolemia, unspecified: Secondary | ICD-10-CM | POA: Diagnosis not present

## 2012-08-13 DIAGNOSIS — Z0000 Encounter for general adult medical examination without abnormal findings: Secondary | ICD-10-CM | POA: Diagnosis not present

## 2012-09-03 ENCOUNTER — Ambulatory Visit: Payer: Medicare Other | Admitting: Licensed Clinical Social Worker

## 2012-09-17 ENCOUNTER — Ambulatory Visit: Payer: Medicare Other | Admitting: Licensed Clinical Social Worker

## 2012-09-24 ENCOUNTER — Ambulatory Visit (INDEPENDENT_AMBULATORY_CARE_PROVIDER_SITE_OTHER): Payer: Medicare Other | Admitting: Licensed Clinical Social Worker

## 2012-09-24 DIAGNOSIS — IMO0002 Reserved for concepts with insufficient information to code with codable children: Secondary | ICD-10-CM | POA: Diagnosis not present

## 2012-10-08 ENCOUNTER — Encounter: Payer: Self-pay | Admitting: Obstetrics & Gynecology

## 2012-10-08 ENCOUNTER — Ambulatory Visit (INDEPENDENT_AMBULATORY_CARE_PROVIDER_SITE_OTHER): Payer: Medicare Other | Admitting: Obstetrics & Gynecology

## 2012-10-08 VITALS — BP 106/62 | HR 60 | Resp 16

## 2012-10-08 DIAGNOSIS — K469 Unspecified abdominal hernia without obstruction or gangrene: Secondary | ICD-10-CM

## 2012-10-08 DIAGNOSIS — B372 Candidiasis of skin and nail: Secondary | ICD-10-CM | POA: Diagnosis not present

## 2012-10-08 MED ORDER — NYSTATIN-TRIAMCINOLONE 100000-0.1 UNIT/GM-% EX CREA: TOPICAL_CREAM | Freq: Two times a day (BID) | CUTANEOUS | Status: DC

## 2012-10-08 NOTE — Patient Instructions (Addendum)
Please call if you have any new issues.

## 2012-10-08 NOTE — Progress Notes (Signed)
72 y.o. Married Caucasian female G0P0000 here for pessary check.  Patient has been using following pessary style and size:  #4 incontinence ring with support.  She is sexually active.  She is having some some external irritation she wants me to look at.  No pain.  No dysuria.    ROS:  Vaginal bleeding:  no  Vaginal pressure:  no  Vaginal discharge:  yes  Vaginal odor:  yes  Urinary incontinence:  no  Other:  none Exam:   BP 106/62  Pulse 60  Resp 16 General appearance: alert and cooperative Inguinal adenopathy: none   Pelvic: External genitalia:  Superficial erythema around rectum, scaly               Urethra: not indicated and normal appearing urethra with no masses, tenderness or lesions              Bartholins and Skenes: Bartholin's, Urethra, Skene's normal                 Vagina: normal appearing vagina with normal color and discharge, atrophic, excoriation at cuff due to pessary, second degree enterocele              Cervix: absent              Pessary was removed without difficulty.  Pessary was cleansed.  Pessary was replaced. Patient tolerated procedure well.    A: Enterocele Skin candida?     P:  Return to office in 3 months for recheck.       Mycolog II bid for up to 7 days.  Pt TCB is symptoms do not resolve.  Rx to pharmacy.    An After Visit Summary was printed and given to the patient.

## 2012-10-14 ENCOUNTER — Telehealth: Payer: Self-pay | Admitting: Obstetrics & Gynecology

## 2012-10-14 NOTE — Telephone Encounter (Signed)
Pt tried [phone] again and it went through, not sure what was wrong.

## 2012-10-14 NOTE — Telephone Encounter (Signed)
Patient is looking for the new phone number for Raynelle Fanning whitt [phone]) no longer in service.

## 2012-10-15 ENCOUNTER — Ambulatory Visit: Payer: Medicare Other | Admitting: Licensed Clinical Social Worker

## 2012-10-22 ENCOUNTER — Ambulatory Visit (INDEPENDENT_AMBULATORY_CARE_PROVIDER_SITE_OTHER): Payer: Medicare Other | Admitting: Licensed Clinical Social Worker

## 2012-10-22 DIAGNOSIS — IMO0002 Reserved for concepts with insufficient information to code with codable children: Secondary | ICD-10-CM | POA: Diagnosis not present

## 2012-11-05 ENCOUNTER — Ambulatory Visit (INDEPENDENT_AMBULATORY_CARE_PROVIDER_SITE_OTHER): Payer: Medicare Other | Admitting: Licensed Clinical Social Worker

## 2012-11-05 DIAGNOSIS — IMO0002 Reserved for concepts with insufficient information to code with codable children: Secondary | ICD-10-CM

## 2012-11-26 ENCOUNTER — Ambulatory Visit: Payer: Medicare Other | Admitting: Licensed Clinical Social Worker

## 2012-12-03 ENCOUNTER — Ambulatory Visit (INDEPENDENT_AMBULATORY_CARE_PROVIDER_SITE_OTHER): Payer: Medicare Other | Admitting: Licensed Clinical Social Worker

## 2012-12-03 DIAGNOSIS — IMO0002 Reserved for concepts with insufficient information to code with codable children: Secondary | ICD-10-CM | POA: Diagnosis not present

## 2012-12-10 DIAGNOSIS — M545 Low back pain: Secondary | ICD-10-CM | POA: Diagnosis not present

## 2012-12-10 DIAGNOSIS — M169 Osteoarthritis of hip, unspecified: Secondary | ICD-10-CM | POA: Diagnosis not present

## 2012-12-15 DIAGNOSIS — M25559 Pain in unspecified hip: Secondary | ICD-10-CM | POA: Diagnosis not present

## 2012-12-15 DIAGNOSIS — M169 Osteoarthritis of hip, unspecified: Secondary | ICD-10-CM | POA: Diagnosis not present

## 2012-12-24 ENCOUNTER — Ambulatory Visit (INDEPENDENT_AMBULATORY_CARE_PROVIDER_SITE_OTHER): Payer: Medicare Other | Admitting: Licensed Clinical Social Worker

## 2012-12-24 DIAGNOSIS — IMO0002 Reserved for concepts with insufficient information to code with codable children: Secondary | ICD-10-CM | POA: Diagnosis not present

## 2013-01-07 ENCOUNTER — Ambulatory Visit (INDEPENDENT_AMBULATORY_CARE_PROVIDER_SITE_OTHER): Payer: Medicare Other | Admitting: Licensed Clinical Social Worker

## 2013-01-07 ENCOUNTER — Ambulatory Visit: Payer: Medicare Other | Admitting: Obstetrics & Gynecology

## 2013-01-07 DIAGNOSIS — IMO0002 Reserved for concepts with insufficient information to code with codable children: Secondary | ICD-10-CM | POA: Diagnosis not present

## 2013-01-12 ENCOUNTER — Telehealth: Payer: Self-pay | Admitting: Obstetrics & Gynecology

## 2013-01-12 ENCOUNTER — Ambulatory Visit: Payer: Medicare Other | Admitting: Obstetrics & Gynecology

## 2013-01-12 NOTE — Telephone Encounter (Signed)
Pt would like nurse to call her regarding a problem she is having.

## 2013-01-12 NOTE — Telephone Encounter (Signed)
Pt cancelled appt for this afternoon because she does not have a caregiver for her mother. Will call back to reschedule.

## 2013-01-12 NOTE — Telephone Encounter (Signed)
Spoke with pt who had to cancel her appt today due to a disagreement with her sister over caregiving responsibilities for their mother. Pt only has Wednesdays off usually and sees SM. Pt planning to switch her day off to Tuesday to be able to continue seeing her. Pt needs appt for pessary change and requests 01-20-13. Scheduled appt at 4 pm. Pt appreciative.

## 2013-01-20 ENCOUNTER — Encounter: Payer: Self-pay | Admitting: Obstetrics & Gynecology

## 2013-01-20 ENCOUNTER — Ambulatory Visit (INDEPENDENT_AMBULATORY_CARE_PROVIDER_SITE_OTHER): Payer: Medicare Other | Admitting: Obstetrics & Gynecology

## 2013-01-20 VITALS — BP 102/64 | HR 60 | Resp 16 | Ht 62.25 in | Wt 137.0 lb

## 2013-01-20 DIAGNOSIS — N8111 Cystocele, midline: Secondary | ICD-10-CM | POA: Diagnosis not present

## 2013-01-20 DIAGNOSIS — IMO0002 Reserved for concepts with insufficient information to code with codable children: Secondary | ICD-10-CM

## 2013-01-20 DIAGNOSIS — E0500 Thyrotoxicosis with diffuse goiter without thyrotoxic crisis or storm: Secondary | ICD-10-CM | POA: Diagnosis not present

## 2013-01-20 DIAGNOSIS — H269 Unspecified cataract: Secondary | ICD-10-CM | POA: Insufficient documentation

## 2013-01-20 NOTE — Progress Notes (Signed)
72 y.o. MarriedWhite female G0P0000 here for pessary check.  Patient has been using following pessary style and size:  Incontinence ring with support, #4.  She is not sexually active.  She describes the following issues with the pessary:  Increased discharge with odor.  ROS: no vaginal bleeding, no dysuria, trouble voiding or hematuria Increased frequency (stopped OAB medications)  Exam:   BP 102/64  Pulse 60  Resp 16  Ht 5' 2.25" (1.581 m)  Wt 137 lb (62.143 kg)  BMI 24.86 kg/m2 General appearance: alert and cooperative Inguinal adenopathy: neg   Pelvic: External genitalia:  no lesions              Urethra: normal appearing urethra with no masses, tenderness or lesions              Bartholins and Skenes: Bartholin's, Urethra, Skene's normal                 Vagina: third degree cystocele, mild vault prolapse, mild erythema of cuff (from pessary)              Cervix: absent Bimanual Exam:  Uterus:  surgically absent, vaginal cuff well healed                               Adnexa:    no masses                   Pessary was removed without difficulty.  Pessary was cleansed.  Pessary was replaced. Patient tolerated procedure well.    A:Cystocele with mild vault prolapse      Vaginal discharge  P:  Return to office in 3-4 months for recheck.   An After Visit Summary was printed and given to the patient.

## 2013-01-20 NOTE — Patient Instructions (Signed)
Please call if you need refill on medication

## 2013-01-28 ENCOUNTER — Ambulatory Visit: Payer: No Typology Code available for payment source | Admitting: Licensed Clinical Social Worker

## 2013-02-04 ENCOUNTER — Ambulatory Visit (INDEPENDENT_AMBULATORY_CARE_PROVIDER_SITE_OTHER): Payer: Medicare Other | Admitting: Licensed Clinical Social Worker

## 2013-02-04 DIAGNOSIS — IMO0002 Reserved for concepts with insufficient information to code with codable children: Secondary | ICD-10-CM | POA: Diagnosis not present

## 2013-02-04 DIAGNOSIS — N390 Urinary tract infection, site not specified: Secondary | ICD-10-CM | POA: Diagnosis not present

## 2013-02-04 DIAGNOSIS — E7800 Pure hypercholesterolemia, unspecified: Secondary | ICD-10-CM | POA: Diagnosis not present

## 2013-02-04 DIAGNOSIS — E039 Hypothyroidism, unspecified: Secondary | ICD-10-CM | POA: Diagnosis not present

## 2013-02-18 DIAGNOSIS — Z23 Encounter for immunization: Secondary | ICD-10-CM | POA: Diagnosis not present

## 2013-02-25 ENCOUNTER — Ambulatory Visit (INDEPENDENT_AMBULATORY_CARE_PROVIDER_SITE_OTHER): Payer: Medicare Other | Admitting: Licensed Clinical Social Worker

## 2013-02-25 DIAGNOSIS — IMO0002 Reserved for concepts with insufficient information to code with codable children: Secondary | ICD-10-CM | POA: Diagnosis not present

## 2013-02-25 DIAGNOSIS — K219 Gastro-esophageal reflux disease without esophagitis: Secondary | ICD-10-CM | POA: Diagnosis not present

## 2013-02-25 DIAGNOSIS — E039 Hypothyroidism, unspecified: Secondary | ICD-10-CM | POA: Diagnosis not present

## 2013-02-25 DIAGNOSIS — E785 Hyperlipidemia, unspecified: Secondary | ICD-10-CM | POA: Diagnosis not present

## 2013-02-25 DIAGNOSIS — F411 Generalized anxiety disorder: Secondary | ICD-10-CM | POA: Diagnosis not present

## 2013-03-18 ENCOUNTER — Ambulatory Visit (INDEPENDENT_AMBULATORY_CARE_PROVIDER_SITE_OTHER): Payer: Medicare Other | Admitting: Licensed Clinical Social Worker

## 2013-03-18 DIAGNOSIS — IMO0002 Reserved for concepts with insufficient information to code with codable children: Secondary | ICD-10-CM | POA: Diagnosis not present

## 2013-03-24 DIAGNOSIS — H269 Unspecified cataract: Secondary | ICD-10-CM | POA: Diagnosis not present

## 2013-04-08 ENCOUNTER — Ambulatory Visit (INDEPENDENT_AMBULATORY_CARE_PROVIDER_SITE_OTHER): Payer: Medicare Other | Admitting: Licensed Clinical Social Worker

## 2013-04-08 DIAGNOSIS — IMO0002 Reserved for concepts with insufficient information to code with codable children: Secondary | ICD-10-CM | POA: Diagnosis not present

## 2013-04-22 DIAGNOSIS — L570 Actinic keratosis: Secondary | ICD-10-CM | POA: Diagnosis not present

## 2013-04-22 DIAGNOSIS — L408 Other psoriasis: Secondary | ICD-10-CM | POA: Diagnosis not present

## 2013-05-06 DIAGNOSIS — D1739 Benign lipomatous neoplasm of skin and subcutaneous tissue of other sites: Secondary | ICD-10-CM | POA: Diagnosis not present

## 2013-05-06 DIAGNOSIS — D485 Neoplasm of uncertain behavior of skin: Secondary | ICD-10-CM | POA: Diagnosis not present

## 2013-05-11 ENCOUNTER — Telehealth: Payer: Self-pay | Admitting: Obstetrics & Gynecology

## 2013-05-11 NOTE — Telephone Encounter (Signed)
Pt calling to reschedule her appointment with dr Sabra Heck scheduled for 1/6. Pt says she can't come in on Friday 1/16 which was the next available i could find.

## 2013-05-11 NOTE — Telephone Encounter (Signed)
Call to patient and scheduled for Monday 05-18-13 at 1000. Patient agreeable to this time/date.  Routing to provider for final review. Patient agreeable to disposition. Will close encounter

## 2013-05-11 NOTE — Telephone Encounter (Signed)
Spoke with pt about rescheduling pessary check. 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 Pt lives an hour away and cannot come to appt on Thurs or Fri. Pt needs morning appt on Mondays or Tuesdays, but SM is in surgery then. Advised pt we will have to look at schedule and get back with her. Pt agreeable.

## 2013-05-12 ENCOUNTER — Ambulatory Visit: Payer: Medicare Other | Admitting: Obstetrics & Gynecology

## 2013-05-13 ENCOUNTER — Ambulatory Visit (INDEPENDENT_AMBULATORY_CARE_PROVIDER_SITE_OTHER): Payer: Medicare Other | Admitting: Licensed Clinical Social Worker

## 2013-05-13 DIAGNOSIS — IMO0002 Reserved for concepts with insufficient information to code with codable children: Secondary | ICD-10-CM | POA: Diagnosis not present

## 2013-05-18 ENCOUNTER — Encounter: Payer: Self-pay | Admitting: Obstetrics & Gynecology

## 2013-05-18 ENCOUNTER — Ambulatory Visit (INDEPENDENT_AMBULATORY_CARE_PROVIDER_SITE_OTHER): Payer: Medicare Other | Admitting: Obstetrics & Gynecology

## 2013-05-18 VITALS — BP 104/60 | HR 60 | Resp 16 | Ht 61.5 in | Wt 136.2 lb

## 2013-05-18 DIAGNOSIS — Z86718 Personal history of other venous thrombosis and embolism: Secondary | ICD-10-CM

## 2013-05-18 DIAGNOSIS — IMO0002 Reserved for concepts with insufficient information to code with codable children: Secondary | ICD-10-CM

## 2013-05-18 DIAGNOSIS — N8111 Cystocele, midline: Secondary | ICD-10-CM | POA: Diagnosis not present

## 2013-05-18 MED ORDER — ALPRAZOLAM 1 MG PO TABS: 1.0000 mg | ORAL_TABLET | Freq: Every evening | ORAL | Status: DC | PRN

## 2013-05-18 MED ORDER — METRONIDAZOLE 0.75 % VA GEL: 1.0000 | Freq: Every day | VAGINAL | Status: DC

## 2013-05-18 NOTE — Progress Notes (Signed)
73 y.o. Married White female G0P0000 here for pessary check.  Patient has been using following pessary style and size:  Incontinence ring with support, #4.  She is sexually active.  She describes the following issues with the pessary:  Mild discharge.  This is a recurrent problem as pt cannot use estrogens due to hx of DVT.  Metrogel usually helps the discharge.  Reports very stressful last few months.  Having legal troubles again at work.  Mother is requiring a lot of care and she is spending most nights.  Husband was out of work for a month and this got them behind a little financially.  Seeing a therapist and on medication.  ROS: no vaginal bleeding, no dysuria, trouble voiding or hematuria, + discharge, no pelvic pain  Exam:   BP 104/60  Pulse 60  Resp 16  Ht 5' 1.5" (1.562 m)  Wt 136 lb 3.2 oz (61.78 kg)  BMI 25.32 kg/m2 General appearance: alert and cooperative Cervical, supraclavicular, and axillary nodes normal.   Pelvic: External genitalia:  no lesions              Urethra: normal appearing urethra with no masses, tenderness or lesions              Bartholins and Skenes: normal, Bartholin's, Urethra, Skene's normal                 Vagina: After removal of pessary, vaginal apex with one area of mild irritation/erosion, no vaginal bleeding even when touched with Hassell Done swap              Cervix: absent Bimanual Exam:  Uterus:  surgically absent, vaginal cuff well healed                               Adnexa:    normal adnexa in size, nontender and no masses                               Anus:  defer exam  Pessary was removed without difficulty.  Pessary was cleansed.  Pessary was replaced. Patient tolerated procedure well.    A: Cystocele and mild vault prolapse  P:  Return to office in 3-4 months for recheck.       Metrogel 9.92% one applicator QHS for 5 nights.  Rx to pharmacy.    An After Visit Summary was printed and given to the patient.

## 2013-05-18 NOTE — Patient Instructions (Signed)
Please call with any new changes/problems.

## 2013-05-25 ENCOUNTER — Ambulatory Visit: Payer: No Typology Code available for payment source | Admitting: Licensed Clinical Social Worker

## 2013-06-03 ENCOUNTER — Ambulatory Visit (INDEPENDENT_AMBULATORY_CARE_PROVIDER_SITE_OTHER): Payer: Medicare Other | Admitting: Licensed Clinical Social Worker

## 2013-06-03 DIAGNOSIS — IMO0002 Reserved for concepts with insufficient information to code with codable children: Secondary | ICD-10-CM

## 2013-06-16 ENCOUNTER — Other Ambulatory Visit: Payer: Self-pay

## 2013-06-16 DIAGNOSIS — Z1231 Encounter for screening mammogram for malignant neoplasm of breast: Secondary | ICD-10-CM

## 2013-06-16 DIAGNOSIS — Z9889 Other specified postprocedural states: Secondary | ICD-10-CM

## 2013-06-17 ENCOUNTER — Ambulatory Visit (INDEPENDENT_AMBULATORY_CARE_PROVIDER_SITE_OTHER): Payer: Medicare Other | Admitting: Licensed Clinical Social Worker

## 2013-06-17 DIAGNOSIS — IMO0002 Reserved for concepts with insufficient information to code with codable children: Secondary | ICD-10-CM | POA: Diagnosis not present

## 2013-07-01 ENCOUNTER — Ambulatory Visit: Payer: No Typology Code available for payment source | Admitting: Licensed Clinical Social Worker

## 2013-07-13 ENCOUNTER — Ambulatory Visit: Payer: Medicare Other | Admitting: Obstetrics & Gynecology

## 2013-07-13 ENCOUNTER — Ambulatory Visit (INDEPENDENT_AMBULATORY_CARE_PROVIDER_SITE_OTHER): Payer: Medicare Other | Admitting: Licensed Clinical Social Worker

## 2013-07-13 DIAGNOSIS — IMO0002 Reserved for concepts with insufficient information to code with codable children: Secondary | ICD-10-CM

## 2013-07-15 ENCOUNTER — Encounter: Payer: Self-pay | Admitting: Obstetrics & Gynecology

## 2013-07-15 ENCOUNTER — Ambulatory Visit (INDEPENDENT_AMBULATORY_CARE_PROVIDER_SITE_OTHER): Payer: Medicare Other | Admitting: Obstetrics & Gynecology

## 2013-07-15 VITALS — BP 102/65 | HR 71 | Temp 98.1°F | Resp 16 | Wt 137.0 lb

## 2013-07-15 DIAGNOSIS — IMO0002 Reserved for concepts with insufficient information to code with codable children: Secondary | ICD-10-CM

## 2013-07-15 DIAGNOSIS — N8111 Cystocele, midline: Secondary | ICD-10-CM

## 2013-07-15 DIAGNOSIS — N390 Urinary tract infection, site not specified: Secondary | ICD-10-CM

## 2013-07-15 LAB — POCT URINALYSIS DIPSTICK
BILIRUBIN UA: NEGATIVE
Blood, UA: 250
GLUCOSE UA: NEGATIVE
KETONES UA: NEGATIVE
Nitrite, UA: POSITIVE
Urobilinogen, UA: NEGATIVE
pH, UA: 5

## 2013-07-15 MED ORDER — NITROFURANTOIN MONOHYD MACRO 100 MG PO CAPS: 100.0000 mg | ORAL_CAPSULE | Freq: Two times a day (BID) | ORAL | Status: DC

## 2013-07-15 NOTE — Patient Instructions (Signed)
We will call with your urine culture results.

## 2013-07-15 NOTE — Progress Notes (Signed)
73 y.o. MarriedWhite female G0P0000 here for pessary check and also for complaint of vaginal pressure and increased frequency.  She is also having low back pain.  Denies fevers.  Patient has been using following pessary style and size:  Incontinence ring with support, #4.  She is sexually active.    ROS: no vaginal bleeding, no dysuria, trouble voiding or hematuria Increased frequency (stopped OAB medications)  Exam:   BP 102/65  Pulse 71  Temp(Src) 98.1 F (36.7 C)  Resp 16  Wt 137 lb (62.143 kg) General appearance: alert and cooperative Inguinal adenopathy: neg   Pelvic: External genitalia:  no lesions              Urethra: normal appearing urethra with no masses, tenderness or lesions              Bartholins and Skenes: Bartholin's, Urethra, Skene's normal                 Vagina: third degree cystocele, mild vault prolapse, mild erythema of cuff (from pessary)              Cervix: absent Bimanual Exam:  Uterus:  surgically absent, vaginal cuff well healed                               Adnexa:    no masses                   Pessary was removed without difficulty.  Pessary was cleansed.  Pessary was replaced. Patient tolerated procedure well.    A:Cystocele with mild vault prolapse     UTI?     H/O OAB  P:  Urine culture Macrobid 100mg  bid x 7 days.   Will order new pessary for pt.   An After Visit Summary was printed and given to the patient.

## 2013-07-17 LAB — URINE CULTURE

## 2013-07-19 NOTE — Addendum Note (Signed)
Addended by: Megan Salon on: 07/19/2013 11:08 PM   Modules accepted: Orders

## 2013-07-20 ENCOUNTER — Telehealth: Payer: Self-pay

## 2013-07-20 MED ORDER — CIPROFLOXACIN HCL 500 MG PO TABS: 500.0000 mg | ORAL_TABLET | Freq: Two times a day (BID) | ORAL | Status: DC

## 2013-07-20 NOTE — Telephone Encounter (Signed)
When patient calls back, we can let her know pessary is here and per Dr Sabra Heck, she can pick it up when she comes in for [facility].

## 2013-07-20 NOTE — Telephone Encounter (Signed)
Message copied by Robley Fries on Mon Jul 20, 2013  9:12 AM ------      Message from: Megan Salon      Created: Sun Jul 19, 2013 11:08 PM       Inform culture positive for e. Coli. On macrobid.  Needs to complete entire 7 day course.  Hopefully she is feeling better.  Needs TOC in 2 weeks or so.  She doesn't like driving, you know, so can schedule when convenient for her.  Order placed. ------

## 2013-07-20 NOTE — Addendum Note (Signed)
Addended by: Megan Salon on: 07/20/2013 11:11 AM   Modules accepted: Orders

## 2013-07-20 NOTE — Telephone Encounter (Signed)
Message copied by Jaymes Graff on Mon Jul 20, 2013  1:15 PM ------      Message from: Megan Salon      Created: Wed Jul 15, 2013 11:40 AM      Regarding: new pessary       Need new #4 incontinence ring with support, no knob.  [id number] is on the old one.  Thanks.  It is a soft one.            MSM ------

## 2013-07-20 NOTE — Telephone Encounter (Signed)
Lmtcb//kn 

## 2013-07-24 NOTE — Telephone Encounter (Signed)
Spoke with patient, states she is feeling much better since starting the cipro. Will finish the medication and call PRN. Aware her pessary came in and she can pick it up at [facility] visit.

## 2013-07-27 ENCOUNTER — Ambulatory Visit
Admission: RE | Admit: 2013-07-27 | Discharge: 2013-07-27 | Disposition: A | Discharge status: Home / Self Care | Payer: Medicare Other | Source: Ambulatory Visit

## 2013-07-27 DIAGNOSIS — Z9889 Other specified postprocedural states: Secondary | ICD-10-CM

## 2013-07-27 DIAGNOSIS — Z1231 Encounter for screening mammogram for malignant neoplasm of breast: Secondary | ICD-10-CM | POA: Diagnosis not present

## 2013-07-29 ENCOUNTER — Ambulatory Visit: Payer: Medicare Other

## 2013-07-29 ENCOUNTER — Ambulatory Visit: Payer: Medicare Other | Admitting: Licensed Clinical Social Worker

## 2013-08-03 ENCOUNTER — Telehealth: Payer: Self-pay | Admitting: Obstetrics & Gynecology

## 2013-08-03 DIAGNOSIS — N390 Urinary tract infection, site not specified: Secondary | ICD-10-CM

## 2013-08-03 NOTE — Telephone Encounter (Signed)
FYI only--Patient cancelled her appointment for TOC urine 08/05/13 due to "feeling better." She will call back to reschedule if needed.

## 2013-08-04 NOTE — Telephone Encounter (Signed)
Left message to call Kaitlyn at [phone]. 

## 2013-08-04 NOTE — Telephone Encounter (Signed)
I'm glad she is better but I still want to make sure urine culture is negative.  Can do sometime when near office.  Please call and put her back on schedule.

## 2013-08-05 ENCOUNTER — Ambulatory Visit: Payer: Medicare Other

## 2013-08-05 NOTE — Telephone Encounter (Signed)
Left message to call Kaitlyn at [phone]. 

## 2013-08-12 NOTE — Telephone Encounter (Signed)
Spoke with patient. Advised of message from [place]. Patient states that she has completed medication and is "feeling better" but now has a "boil on my buttocks." States that she is having a lot of pain due to location. Has put alcohol on place and opened with needle which she states has made the place smaller in size but it is still painful and not completely gone. Would like to come in to see Dr.Miller for her to assess. Requesting appointment for Monday morning. Appointment made for Monday at 10:00 with Dr.Miller. Patient aware will have TOC urine done at this visit as well. Order released and appointment note placed. Patient agreeable and verbalizes understanding.  Routing to provider for final review. Patient agreeable to disposition. Will close encounter

## 2013-08-17 ENCOUNTER — Encounter: Payer: Self-pay | Admitting: Obstetrics & Gynecology

## 2013-08-17 ENCOUNTER — Ambulatory Visit (INDEPENDENT_AMBULATORY_CARE_PROVIDER_SITE_OTHER): Payer: Medicare Other | Admitting: Obstetrics & Gynecology

## 2013-08-17 VITALS — BP 102/58 | HR 64 | Resp 16 | Ht 61.5 in | Wt 137.0 lb

## 2013-08-17 DIAGNOSIS — B373 Candidiasis of vulva and vagina: Secondary | ICD-10-CM | POA: Diagnosis not present

## 2013-08-17 DIAGNOSIS — N390 Urinary tract infection, site not specified: Secondary | ICD-10-CM | POA: Diagnosis not present

## 2013-08-17 DIAGNOSIS — B3731 Acute candidiasis of vulva and vagina: Secondary | ICD-10-CM

## 2013-08-17 DIAGNOSIS — K469 Unspecified abdominal hernia without obstruction or gangrene: Secondary | ICD-10-CM | POA: Diagnosis not present

## 2013-08-17 MED ORDER — FLUCONAZOLE 150 MG PO TABS: 150.0000 mg | ORAL_TABLET | Freq: Once | ORAL | Status: DC

## 2013-08-17 NOTE — Progress Notes (Signed)
Subjective:     Patient ID: Victoria Sims, female   DOB: 1940/11/11, 73 y.o.   MRN: [id number]  HPI 73 yo G0 MWF here for placement of new pessary.  Also, had new issues of boil on buttocks that started about a week ago.  She used some boil cream on it.  It ruptured on it's own and is now much less tender.  She feels there is still some "hardness" to is and wants me to look.  Also, needs TOC on urine after having a UTI.  Was on macrobid which didn't improve symptoms despite having a positive urine culture that was sensitive to the macrobid.  In the end, changing medication to cipro resulted in resolution of symptoms.  Now patient having some increased discharge with itching.  Finally, pt really wants me to write a weight loss pill for her.  I have declined in the past and continue to do so today.  Feel risks outweigh benefits.  Strongly advised patient to stop asking for this.  Review of Systems  All other systems reviewed and are negative.      Objective:   Physical Exam  Constitutional: She is oriented to person, place, and time. She appears well-developed and well-nourished.  Genitourinary: There is no lesion or injury on the right labia. There is no lesion or injury on the left labia. No bleeding around the vagina. Vaginal discharge (white, thick, clumpy) found.  Cervix and uterus absent.  3rd degree enterocele present.  Prior pessary removed and discarded.  New pessary placed without difficulty.  #4 incontinncne ring with support.  (no knob).  Area on right buttocks is about 3cm in size.  Healing present.  No fluctuation.  Nontender to palpation.  No drainage.    Musculoskeletal: Normal range of motion.  Lymphadenopathy:       Right: No inguinal adenopathy present.       Left: No inguinal adenopathy present.  Neurological: She is alert and oriented to person, place, and time.  Skin: Skin is warm and dry.  Psychiatric: She has a normal mood and affect.   Wet smear:  Ph 4.5.   Saline with -clue cells.  KOH +candida.  Neg Whiff.   Assessment:     Enterocele Healing buttocks boil Yeast vaginitis  Recent UTI with resolution of symptoms    Plan:     Diflucan 150mg  po x 1, repeat 2 days if needed.  #2/0RF.  Pessary check on 3 months. Pt to call if has new boil for me to see as soon as she can be seen.  If draining, will do culture.  Pt voices understanding. Urine culture pending.

## 2013-08-17 NOTE — Patient Instructions (Signed)
Candida Infection, Adult A candida infection (also called yeast, fungus and Monilia infection) is an overgrowth of yeast that can occur anywhere on the body. A yeast infection commonly occurs in warm, moist body areas. Usually, the infection remains localized but can spread to become a systemic infection. A yeast infection may be a sign of a more severe disease such as diabetes, leukemia, or AIDS. A yeast infection can occur in both men and women. In women, Candida vaginitis is a vaginal infection. It is one of the most common causes of vaginitis. Men usually do not have symptoms or know they have an infection until other problems develop. Men may find out they have a yeast infection because their sex partner has a yeast infection. Uncircumcised men are more likely to get a yeast infection than circumcised men. This is because the uncircumcised glans is not exposed to air and does not remain as dry as that of a circumcised glans. Older adults may develop yeast infections around dentures. CAUSES  Women  Antibiotics.  Steroid medication taken for a long time.  Being overweight (obese).  Diabetes.  Poor immune condition.  Certain serious medical conditions.  Immune suppressive medications for organ transplant patients.  Chemotherapy.  Pregnancy.  Menstration.  Stress and fatigue.  Intravenous drug use.  Oral contraceptives.  Wearing tight-fitting clothes in the crotch area.  Catching it from a sex partner who has a yeast infection.  Spermicide.  Intravenous, urinary, or other catheters. Men  Catching it from a sex partner who has a yeast infection.  Having oral or anal sex with a person who has the infection.  Spermicide.  Diabetes.  Antibiotics.  Poor immune system.  Medications that suppress the immune system.  Intravenous drug use.  Intravenous, urinary, or other catheters. SYMPTOMS  Women  Thick, white vaginal discharge.  Vaginal itching.  Redness and  swelling in and around the vagina.  Irritation of the lips of the vagina and perineum.  Blisters on the vaginal lips and perineum.  Painful sexual intercourse.  Low blood sugar (hypoglycemia).  Painful urination.  Bladder infections.  Intestinal problems such as constipation, indigestion, bad breath, bloating, increase in gas, diarrhea, or loose stools. Men  Men may develop intestinal problems such as constipation, indigestion, bad breath, bloating, increase in gas, diarrhea, or loose stools.  Dry, cracked skin on the penis with itching or discomfort.  Jock itch.  Dry, flaky skin.  Athlete's foot.  Hypoglycemia. DIAGNOSIS  Women  A history and an exam are performed.  The discharge may be examined under a microscope.  A culture may be taken of the discharge. Men  A history and an exam are performed.  Any discharge from the penis or areas of cracked skin will be looked at under the microscope and cultured.  Stool samples may be cultured. TREATMENT  Women  Vaginal antifungal suppositories and creams.  Medicated creams to decrease irritation and itching on the outside of the vagina.  Warm compresses to the perineal area to decrease swelling and discomfort.  Oral antifungal medications.  Medicated vaginal suppositories or cream for repeated or recurrent infections.  Wash and dry the irritation areas before applying the cream.  Eating yogurt with lactobacillus may help with prevention and treatment.  Sometimes painting the vagina with gentian violet solution may help if creams and suppositories do not work. Men  Antifungal creams and oral antifungal medications.  Sometimes treatment must continue for 30 days after the symptoms go away to prevent recurrence. HOME CARE  INSTRUCTIONS  Women  Use cotton underwear and avoid tight-fitting clothing.  Avoid colored, scented toilet paper and deodorant tampons or pads.  Do not douche.  Keep your diabetes  under control.  Finish all the prescribed medications.  Keep your skin clean and dry.  Consume milk or yogurt with lactobacillus active culture regularly. If you get frequent yeast infections and think that is what the infection is, there are over-the-counter medications that you can get. If the infection does not show healing in 3 days, talk to your caregiver.  Tell your sex partner you have a yeast infection. Your partner may need treatment also, especially if your infection does not clear up or recurs. Men  Keep your skin clean and dry.  Keep your diabetes under control.  Finish all prescribed medications.  Tell your sex partner that you have a yeast infection so they can be treated if necessary. SEEK MEDICAL CARE IF:   Your symptoms do not clear up or worsen in one week after treatment.  You have an oral temperature above 102 F (38.9 C).  You have trouble swallowing or eating for a prolonged time.  You develop blisters on and around your vagina.  You develop vaginal bleeding and it is not your menstrual period.  You develop abdominal pain.  You develop intestinal problems as mentioned above.  You get weak or lightheaded.  You have painful or increased urination.  You have pain during sexual intercourse. MAKE SURE YOU:   Understand these instructions.  Will watch your condition.  Will get help right away if you are not doing well or get worse. Document Released: 05/31/2004 Document Revised: 07/16/2011 Document Reviewed: 09/12/2009 [facility] Patient Information 2014 [place].

## 2013-08-18 LAB — URINE CULTURE
Colony Count: NO GROWTH
Organism ID, Bacteria: NO GROWTH

## 2013-08-19 ENCOUNTER — Ambulatory Visit: Payer: Medicare Other | Admitting: Licensed Clinical Social Worker

## 2013-08-26 DIAGNOSIS — M169 Osteoarthritis of hip, unspecified: Secondary | ICD-10-CM | POA: Diagnosis not present

## 2013-08-26 DIAGNOSIS — M67919 Unspecified disorder of synovium and tendon, unspecified shoulder: Secondary | ICD-10-CM | POA: Diagnosis not present

## 2013-08-26 DIAGNOSIS — M1610 Unilateral primary osteoarthritis, unspecified hip: Secondary | ICD-10-CM | POA: Diagnosis not present

## 2013-08-26 DIAGNOSIS — M719 Bursopathy, unspecified: Secondary | ICD-10-CM | POA: Diagnosis not present

## 2013-09-02 ENCOUNTER — Ambulatory Visit (INDEPENDENT_AMBULATORY_CARE_PROVIDER_SITE_OTHER): Payer: Medicare Other | Admitting: Licensed Clinical Social Worker

## 2013-09-02 DIAGNOSIS — F3289 Other specified depressive episodes: Secondary | ICD-10-CM | POA: Diagnosis not present

## 2013-09-02 DIAGNOSIS — K219 Gastro-esophageal reflux disease without esophagitis: Secondary | ICD-10-CM | POA: Diagnosis not present

## 2013-09-02 DIAGNOSIS — IMO0002 Reserved for concepts with insufficient information to code with codable children: Secondary | ICD-10-CM | POA: Diagnosis not present

## 2013-09-02 DIAGNOSIS — E039 Hypothyroidism, unspecified: Secondary | ICD-10-CM | POA: Diagnosis not present

## 2013-09-02 DIAGNOSIS — F329 Major depressive disorder, single episode, unspecified: Secondary | ICD-10-CM | POA: Diagnosis not present

## 2013-09-02 DIAGNOSIS — Z0000 Encounter for general adult medical examination without abnormal findings: Secondary | ICD-10-CM | POA: Diagnosis not present

## 2013-09-08 DIAGNOSIS — H2510 Age-related nuclear cataract, unspecified eye: Secondary | ICD-10-CM | POA: Diagnosis not present

## 2013-09-08 DIAGNOSIS — H04129 Dry eye syndrome of unspecified lacrimal gland: Secondary | ICD-10-CM | POA: Diagnosis not present

## 2013-09-09 DIAGNOSIS — M25559 Pain in unspecified hip: Secondary | ICD-10-CM | POA: Diagnosis not present

## 2013-09-09 DIAGNOSIS — M169 Osteoarthritis of hip, unspecified: Secondary | ICD-10-CM | POA: Diagnosis not present

## 2013-09-09 DIAGNOSIS — M1610 Unilateral primary osteoarthritis, unspecified hip: Secondary | ICD-10-CM | POA: Diagnosis not present

## 2013-09-16 ENCOUNTER — Ambulatory Visit (INDEPENDENT_AMBULATORY_CARE_PROVIDER_SITE_OTHER): Payer: Medicare Other | Admitting: Licensed Clinical Social Worker

## 2013-09-16 DIAGNOSIS — IMO0002 Reserved for concepts with insufficient information to code with codable children: Secondary | ICD-10-CM | POA: Diagnosis not present

## 2013-09-17 ENCOUNTER — Encounter: Payer: Self-pay | Admitting: Obstetrics & Gynecology

## 2013-09-30 ENCOUNTER — Ambulatory Visit (INDEPENDENT_AMBULATORY_CARE_PROVIDER_SITE_OTHER): Payer: Medicare Other | Admitting: Licensed Clinical Social Worker

## 2013-09-30 DIAGNOSIS — IMO0002 Reserved for concepts with insufficient information to code with codable children: Secondary | ICD-10-CM

## 2013-10-20 ENCOUNTER — Ambulatory Visit: Payer: Medicare Other | Admitting: Obstetrics & Gynecology

## 2013-10-21 ENCOUNTER — Ambulatory Visit (INDEPENDENT_AMBULATORY_CARE_PROVIDER_SITE_OTHER): Payer: Medicare Other | Admitting: Licensed Clinical Social Worker

## 2013-10-21 DIAGNOSIS — IMO0002 Reserved for concepts with insufficient information to code with codable children: Secondary | ICD-10-CM

## 2013-11-09 ENCOUNTER — Telehealth: Payer: Self-pay | Admitting: Obstetrics & Gynecology

## 2013-11-09 NOTE — Telephone Encounter (Signed)
Spoke with patient. Appointment rescheduled to July 13th at [place] with Dr.Miller. Patient agreeable to date and time.  Routing to provider for final review. Patient agreeable to disposition. Will close encounter

## 2013-11-09 NOTE — Telephone Encounter (Signed)
Patient has a 3 mth pessary appt for 11/17/13 at 2:30 she said she needs something earlier in the day. She requested for morning on mon tues to wed. i told her dr Sabra Heck has surgeries Monday and Tuesday and is off on wednesdays. 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 There is nothing to offer her at all. Can you please help her find an appt.

## 2013-11-11 ENCOUNTER — Ambulatory Visit (INDEPENDENT_AMBULATORY_CARE_PROVIDER_SITE_OTHER): Payer: Medicare Other | Admitting: Licensed Clinical Social Worker

## 2013-11-11 DIAGNOSIS — IMO0002 Reserved for concepts with insufficient information to code with codable children: Secondary | ICD-10-CM

## 2013-11-16 ENCOUNTER — Telehealth: Payer: Self-pay | Admitting: Obstetrics & Gynecology

## 2013-11-16 ENCOUNTER — Encounter: Payer: Self-pay | Admitting: Obstetrics & Gynecology

## 2013-11-16 ENCOUNTER — Ambulatory Visit (INDEPENDENT_AMBULATORY_CARE_PROVIDER_SITE_OTHER): Payer: Medicare Other | Admitting: Obstetrics & Gynecology

## 2013-11-16 VITALS — BP 102/60 | HR 64 | Temp 97.6°F | Resp 16 | Wt 132.8 lb

## 2013-11-16 DIAGNOSIS — K469 Unspecified abdominal hernia without obstruction or gangrene: Secondary | ICD-10-CM | POA: Diagnosis not present

## 2013-11-16 DIAGNOSIS — N390 Urinary tract infection, site not specified: Secondary | ICD-10-CM

## 2013-11-16 LAB — POCT URINALYSIS DIPSTICK
BILIRUBIN UA: NEGATIVE
Blood, UA: 3
GLUCOSE UA: NEGATIVE
Ketones, UA: NEGATIVE
NITRITE UA: POSITIVE
UROBILINOGEN UA: NEGATIVE
pH, UA: 5

## 2013-11-16 MED ORDER — NITROFURANTOIN MONOHYD MACRO 100 MG PO CAPS: 100.0000 mg | ORAL_CAPSULE | Freq: Two times a day (BID) | ORAL | Status: DC

## 2013-11-16 MED ORDER — METRONIDAZOLE 0.75 % VA GEL: 1.0000 | Freq: Every day | VAGINAL | Status: DC

## 2013-11-16 NOTE — Telephone Encounter (Signed)
Patient needs a 4 month pessary check with Dr.Miller. Patient is only available Mon., Tues. Wed (morning appointments only) I told patient those morning are held for surgery. Patient says Dr.Miller knows her situation and always works her in schedule. I told patient I would send a message to the nursing supervisor to see if should help. Patient said she appreciate her help.

## 2013-11-16 NOTE — Progress Notes (Signed)
Patient ID: Victoria Sims, female   DOB: 10-27-1940, 73 y.o.   MRN: [id number]  73 y.o. Married White female G0P0000 here for pessary check.  Patient has been using following pessary style and size:  #4 incontinence ring.  She is sexually active.  Patient having a week hx of dysuria and increased frequency.  Reports having a lot of stress at home.  Husband has a staph infection that isn't clearing up.  Mother is doing much worse and pt is providing a lot of evening care for her.  ROS:  Increased vaginal discharge  Exam:   BP 102/60  Pulse 64  Temp(Src) 97.6 F (36.4 C) (Oral)  Resp 16  Wt 132 lb 12.8 oz (60.238 kg) General appearance: alert and cooperative Cervical, supraclavicular, and axillary nodes normal.   Pelvic: External genitalia:  no lesions              Urethra: normal appearing urethra with no masses, tenderness or lesions              Bartholins and Skenes: normal                 Vagina: normal appearing vagina with normal color and discharge, no lesions, vaginal discharge - yellowish with some odor              Cervix: absent Bimanual Exam:  Uterus:  surgically absent, vaginal cuff well healed                               Adnexa:    no masses                               Anus:  defer exam  Pessary was removed without difficulty.  Pessary was cleansed.  Pessary was replaced. Patient tolerated procedure well.    A: UTI      Enterocele      BV  P:  Return to office in 4 months for recheck.       Urine culture pending       Macrobid 100mg  bid x 7 days       Metrogel qhs x 5 days    An After Visit Summary was printed and given to the patient.

## 2013-11-16 NOTE — Telephone Encounter (Signed)
Dr Sabra Heck, my only thoughts to work her if for MON?tues am are to wit to see what surgery schedule is like at the time.  That would be mid OCT and surgery schedule picks up around that time. Any advise?

## 2013-11-16 NOTE — Telephone Encounter (Signed)
Fine with me.  Just put in the late morning.  I'm sure I'll be able to work her in around some surgery time.  Thanks.

## 2013-11-17 ENCOUNTER — Ambulatory Visit: Payer: Medicare Other | Admitting: Obstetrics & Gynecology

## 2013-11-18 ENCOUNTER — Telehealth: Payer: Self-pay | Admitting: Obstetrics & Gynecology

## 2013-11-18 MED ORDER — CIPROFLOXACIN HCL 500 MG PO TABS: 500.0000 mg | ORAL_TABLET | Freq: Two times a day (BID) | ORAL | Status: DC

## 2013-11-18 NOTE — Telephone Encounter (Signed)
Spoke with patient. She states that she started Macrobid on Tuesday and took two doses and was not able to tolerate any further doses. Tuesday night was feeling nauseated and chills.  Does not have thermometer so unable to take temperature. No vomiting, or diarrhea. Drinking fluids well, but unable to eat with nausea. Advised would review with Dr. Sabra Heck and return her call.  1157: Reviewed patient with Dr. Sabra Heck and ordered for Cipro 500 mg BID. Take two doses today. Dr. Sabra Heck placed order. Patient notified. Advised very important to start today and take two doses of Cipro today.. Call back or go to local urgent care or ER if symptoms worsen, unable to tolerate Cipro or fevers, vomiting or diarrhea develop. She verbalizes understanding of instructions.  Routing to provider for final review. Patient agreeable to disposition. Will close encounter

## 2013-11-18 NOTE — Telephone Encounter (Signed)
Message left to return call to Axelle Szwed at [phone].    

## 2013-11-18 NOTE — Telephone Encounter (Signed)
Patient was given an antibiotic on Monday and was up all night feeling sick. Patient says she has never had this reaction before. Please call ASAP.

## 2013-11-18 NOTE — Addendum Note (Signed)
Addended by: Megan Salon on: 11/18/2013 11:54 AM   Modules accepted: Orders

## 2013-11-19 ENCOUNTER — Telehealth: Payer: Self-pay

## 2013-11-19 LAB — URINE CULTURE

## 2013-11-19 NOTE — Telephone Encounter (Signed)
Message copied by Robley Fries on Thu Nov 19, 2013  2:45 PM ------      Message from: Megan Salon      Created: Wed Nov 18, 2013 12:12 PM       Pt called 7/15 with continued symptoms.  Antibiotic changed to Cipro 500mg  bid x 7 days.  Will need to await sensitives. ------

## 2013-11-19 NOTE — Telephone Encounter (Signed)
Patient notified of results. See result note.//kn 

## 2013-11-19 NOTE — Telephone Encounter (Signed)
Lmtcb//kn 

## 2013-11-20 ENCOUNTER — Ambulatory Visit: Payer: Medicare Other | Admitting: Obstetrics & Gynecology

## 2013-11-20 NOTE — Telephone Encounter (Signed)
Call to patient to assist in scheduleing. LMTCB.

## 2013-11-20 NOTE — Telephone Encounter (Signed)
Discussed options for Monday appointments, scheduled for 02-08-14 at 1230. Pateint aware may need to adjust depending on surgery schedule. Patient agreeable. Encounter closed.

## 2013-11-23 ENCOUNTER — Ambulatory Visit: Payer: Medicare Other | Admitting: Obstetrics & Gynecology

## 2013-11-26 ENCOUNTER — Telehealth: Payer: Self-pay | Admitting: Obstetrics & Gynecology

## 2013-11-26 NOTE — Telephone Encounter (Signed)
Spoke with patient. Patient states that she had a "bump on her butt" that was extremely painful. Was applying cream to it and it took 4 weeks for it to come to a head and pop. Patient saw Dr.Miller shortly after and told her about it. States that Dr.Miller advised if it were to happen again to call so that she could be scheduled to see Dr.Miller. Bump has now returned 3 days ago in the same location as before.Patient called dermatologist but "They are unwilling to touch it. They said that I will need antibiotics first." States the bump is red and "unbearable." Requesting to be seen by Dr.Miller. Advised would check scheduling and give patient a call back with best appointment date and time. Patient agreeable.

## 2013-11-26 NOTE — Telephone Encounter (Signed)
Patient has a "place on her bottom" that she saw her dermatologist for and was given antibiotics. Patient would like to talk to Dr.Miller's nurse in hopes that she could see Dr.Miller for this problem.

## 2013-11-26 NOTE — Telephone Encounter (Signed)
Spoke with patient. Offered 9:30am appointment tomorrow 7/24 with Dr.Lathrop. Patient declines stating that she has to work and that she does not want to see anyone but Dr.Miller. Appointment made for Monday 7/27 at 12:30pm with Dr.Miller time per Gay Filler. Patient agreeable to date and time.  Routing to provider for final review. Patient agreeable to disposition. Will close encounter

## 2013-11-27 NOTE — Telephone Encounter (Signed)
Spoke with patient. Advised of message as seen below from [place]. Patient declines appointment today but is agreeable to monitoring symptoms and being seen over the weekend if anything changes or worsens.  Routing to provider for final review. Patient agreeable to disposition. Will close encounter

## 2013-11-27 NOTE — Telephone Encounter (Signed)
I strongly recommend evaluation and treatment today.  If this is an abscess, patient could become quite ill very quickly. If she chooses to wait until next week for her visit and develops fever, malaise, or increasing pain, to urgent care.

## 2013-11-30 ENCOUNTER — Ambulatory Visit (INDEPENDENT_AMBULATORY_CARE_PROVIDER_SITE_OTHER): Payer: Medicare Other | Admitting: Obstetrics & Gynecology

## 2013-11-30 ENCOUNTER — Encounter: Payer: Self-pay | Admitting: Obstetrics & Gynecology

## 2013-11-30 VITALS — BP 122/78 | HR 60 | Ht 61.75 in | Wt 132.0 lb

## 2013-11-30 DIAGNOSIS — L03317 Cellulitis of buttock: Principal | ICD-10-CM

## 2013-11-30 DIAGNOSIS — N390 Urinary tract infection, site not specified: Secondary | ICD-10-CM | POA: Diagnosis not present

## 2013-11-30 DIAGNOSIS — Z22322 Carrier or suspected carrier of Methicillin resistant Staphylococcus aureus: Secondary | ICD-10-CM | POA: Diagnosis not present

## 2013-11-30 DIAGNOSIS — L0231 Cutaneous abscess of buttock: Secondary | ICD-10-CM

## 2013-11-30 MED ORDER — CEPHALEXIN 500 MG PO CAPS: 500.0000 mg | ORAL_CAPSULE | Freq: Four times a day (QID) | ORAL | Status: DC

## 2013-11-30 MED ORDER — HYDROCODONE-ACETAMINOPHEN 5-300 MG PO TABS: 1.0000 | ORAL_TABLET | Freq: Four times a day (QID) | ORAL | Status: DC

## 2013-11-30 NOTE — Progress Notes (Signed)
Subjective:     Patient ID: Victoria Sims, female   DOB: Jul 14, 1940, 73 y.o.   MRN: [id number]  HPI 73 yo MWF with new onset of tender and now draining area on right buttocks.  Pt reports having similar issue about a year ago.  Was never seen for it.  She reports it took "weeks" to finally go away.  She wants me to do "whatever it takes" to get the "core of this thing" out so she can get better.  It is tender to sit or be flat.  It started about Thursday (about 4 days ago) and has progressively gotten worse until yesterday when it started draining.  That helped it a little.  Denies fever.  No other complaints.  Pt just treated for UTI a couple of weeks ago.  Symptoms are fully resolved.  She left urine today for a repeat culture.  Review of Systems  Constitutional: Negative.   Genitourinary: Positive for genital sores.  Skin: Positive for wound.  Allergic/Immunologic: Negative for immunocompromised state.  Hematological: Negative for adenopathy.       Objective:   Physical Exam  Constitutional: She is oriented to person, place, and time. She appears well-developed and well-nourished.  Appears uncomfortable.  Genitourinary: Vagina normal and uterus normal.    There is no rash, tenderness or lesion on the right labia. There is no rash, tenderness or lesion on the left labia.  Musculoskeletal: Normal range of motion.  Lymphadenopathy:       Right: No inguinal adenopathy present.       Left: No inguinal adenopathy present.  Neurological: She is alert and oriented to person, place, and time.  Skin: Skin is warm and dry.  Psychiatric: She has a normal mood and affect.   Draining lesion cleansed with Betadine.  With compression, purulent material was draining through three small openings.  1% plain Lidocaine was instilled around lesion, 1cc total used.  Using #11 blade, small openings were connected, fully opening lesion.  Much larger amount of purulent material drained.  Lesion probed,  not deep.  No large enough to pack.  Lesion milked and no further purulent drainage present.  Dressing applied.  Pt tolerated procedure well.    Assessment:     Right buttocks abscess with surrounding cellulitis UTI, treated with Cipro, two weeks ago    Plan:     Wound culture of right buttocks Keflex 500mg  qid x 7 days Vicodin 5/300 1-2 every 4-6 hours as needed Urine culture

## 2013-12-02 ENCOUNTER — Ambulatory Visit (INDEPENDENT_AMBULATORY_CARE_PROVIDER_SITE_OTHER): Payer: Medicare Other | Admitting: Licensed Clinical Social Worker

## 2013-12-02 DIAGNOSIS — IMO0002 Reserved for concepts with insufficient information to code with codable children: Secondary | ICD-10-CM | POA: Diagnosis not present

## 2013-12-02 LAB — URINE CULTURE
Colony Count: NO GROWTH
ORGANISM ID, BACTERIA: NO GROWTH

## 2013-12-03 LAB — WOUND CULTURE: GRAM STAIN: NONE SEEN

## 2013-12-03 MED ORDER — DOXYCYCLINE HYCLATE 100 MG PO CAPS: 100.0000 mg | ORAL_CAPSULE | Freq: Two times a day (BID) | ORAL | Status: DC

## 2013-12-04 ENCOUNTER — Telehealth: Payer: Self-pay | Admitting: Obstetrics & Gynecology

## 2013-12-04 DIAGNOSIS — Z22322 Carrier or suspected carrier of Methicillin resistant Staphylococcus aureus: Secondary | ICD-10-CM | POA: Insufficient documentation

## 2013-12-04 NOTE — Telephone Encounter (Signed)
Spoke with patient. 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 "Is is contagious? This is all new to me and I am worried." Advised patient to keep area cover and do not have direct contact with the sore without washing hands. Do not touch the sore and then something or someone else until it has begun to close and heal with antibiotics. "I just need to know what Dr.Miller recommends as far as my mother and I work in the public." Advised it is spread through contact. Patient would like recommendations from [place]. Attempted to schedule 1 week recheck. Offered Aug 6th at 8:45am or 10:45am. Patient states that she is not available Thursday or Friday. Offered Aug 11th at 2pm. Patient is concerned to wait that long and states "I want you to ask Dr.Miller if I should come in on Thursday or if I could wait. I want her opinion." Advised would send a message over to [place] and give patient a call back with further instructions and recommendations. Patient agreeable.  Notes Recorded by Lyman Speller, MD on 12/03/2013 at 5:26 PM Please call pt and inform culture was + for MRSA. Keflex is not strong enough. Need to switch to doxycycline 100mg  bid x 7 days. Needs to take with food. I would like to see her back if possible in about a week. I did leave a detailed message for her on her cell number as indicated on the ROI. Also, I sent the rx for the antibiotic to her pharmacy, [facility], after leaving the message.

## 2013-12-04 NOTE — Telephone Encounter (Deleted)
Message copied by Michele Mcalpine on Fri Dec 04, 2013 11:15 AM ------      Message from: Megan Salon      Created: Thu Dec 03, 2013  5:26 PM       Please call pt and inform culture was + for MRSA.  Keflex is not strong enough.  Need to switch to doxycycline 100mg  bid x 7 days.  Needs to take with food.  I would like to see her back if possible in about a week.  I did leave a detailed message for her on her cell number as indicated on the ROI.  Also, I sent the rx for the antibiotic to her pharmacy, [facility], after leaving the message. ------

## 2013-12-04 NOTE — Telephone Encounter (Signed)
As long as it continues to improve, it is ok to wait for appt.  Could also see her end of day on Tues at like 4:30 if possible.    She just needs to wash her hands after touching the area on her buttocks before touching someone else until it is fully healed.  This is all the precautions she needs to take.

## 2013-12-04 NOTE — Telephone Encounter (Signed)
Patient returning Dr. Ammie Ferrier call from last night. Please see note from Dr. Sabra Heck sent to triage on the lab results.

## 2013-12-04 NOTE — Telephone Encounter (Signed)
Spoke with patient. Advised of message as seen below from [place]. Patient is agreeable. Offered patient appointment at 4:30 on Tuesday August 11th but patient declines stating that she has to be back to take care of her mom at 4pm. Patient would like to take early morning appointment for Aug 6th so she can have someone watch her mom for a little while. Appointment scheduled for 8/6 at 8:45am with Dr.Miller. Patient agreeable to date and time.  Routing to provider for final review. Patient agreeable to disposition. Will close encounter

## 2013-12-09 DIAGNOSIS — M25519 Pain in unspecified shoulder: Secondary | ICD-10-CM | POA: Diagnosis not present

## 2013-12-09 DIAGNOSIS — M1610 Unilateral primary osteoarthritis, unspecified hip: Secondary | ICD-10-CM | POA: Diagnosis not present

## 2013-12-09 DIAGNOSIS — M25559 Pain in unspecified hip: Secondary | ICD-10-CM | POA: Diagnosis not present

## 2013-12-09 DIAGNOSIS — M169 Osteoarthritis of hip, unspecified: Secondary | ICD-10-CM | POA: Diagnosis not present

## 2013-12-10 ENCOUNTER — Encounter: Payer: Self-pay | Admitting: Obstetrics & Gynecology

## 2013-12-10 ENCOUNTER — Ambulatory Visit (INDEPENDENT_AMBULATORY_CARE_PROVIDER_SITE_OTHER): Payer: Medicare Other | Admitting: Obstetrics & Gynecology

## 2013-12-10 VITALS — BP 118/70 | HR 60 | Resp 16 | Ht 61.75 in | Wt 131.6 lb

## 2013-12-10 DIAGNOSIS — L0231 Cutaneous abscess of buttock: Secondary | ICD-10-CM

## 2013-12-10 DIAGNOSIS — L03317 Cellulitis of buttock: Principal | ICD-10-CM

## 2013-12-10 NOTE — Progress Notes (Signed)
Subjective:     Patient ID: Victoria Sims, female   DOB: [birth year], 73 y.o.   MRN: [id number]  HPI 73 yo MWF here for follow up after undergoing I&D of MRSA+ abscess on buttocks.  Pt originally put on Keflex before culture was resulted.  This was changed to Doxycycline 100g bid after culture results back.  Pt still has 3 full days left of medication.  There is still a little mild pain.  No fever.  No drainage.  Pt has been keeping it covered.  Culture results discussed in detail.  Pt understands results and importance of wound care and calling if she ever feels anything like this again so antibiotics can be started early.  Review of Systems  All other systems reviewed and are negative.      Objective:   Physical Exam  Constitutional: She appears well-developed and well-nourished.  Genitourinary:    There is no rash or tenderness on the right labia. There is no rash or tenderness on the left labia.  Lymphadenopathy:       Right: No inguinal adenopathy present.       Left: No inguinal adenopathy present.       Assessment:     Much improved buttocks abscess MRSA +     Plan:     Finish antibiotics Will recheck in about 10 days.  I want to follow until completely resolved.  Will also treat with mupirocin nasal ointment to try and help decrease colonization.

## 2013-12-15 DIAGNOSIS — M25559 Pain in unspecified hip: Secondary | ICD-10-CM | POA: Diagnosis not present

## 2013-12-15 DIAGNOSIS — M1610 Unilateral primary osteoarthritis, unspecified hip: Secondary | ICD-10-CM | POA: Diagnosis not present

## 2013-12-15 DIAGNOSIS — M169 Osteoarthritis of hip, unspecified: Secondary | ICD-10-CM | POA: Diagnosis not present

## 2013-12-21 ENCOUNTER — Ambulatory Visit (INDEPENDENT_AMBULATORY_CARE_PROVIDER_SITE_OTHER): Payer: Medicare Other | Admitting: Obstetrics & Gynecology

## 2013-12-21 VITALS — BP 104/62 | HR 64 | Temp 98.7°F | Resp 16 | Wt 130.4 lb

## 2013-12-21 DIAGNOSIS — L03317 Cellulitis of buttock: Principal | ICD-10-CM

## 2013-12-21 DIAGNOSIS — L0231 Cutaneous abscess of buttock: Secondary | ICD-10-CM

## 2013-12-21 MED ORDER — MUPIROCIN 2 % EX OINT: 1.0000 "application " | TOPICAL_OINTMENT | Freq: Two times a day (BID) | CUTANEOUS | Status: DC

## 2013-12-21 MED ORDER — DOXYCYCLINE HYCLATE 100 MG PO CAPS: 100.0000 mg | ORAL_CAPSULE | Freq: Two times a day (BID) | ORAL | Status: DC

## 2013-12-21 NOTE — Progress Notes (Signed)
Subjective:     Patient ID: Victoria Sims, female   DOB: 02-Apr-1941, 73 y.o.   MRN: [id number]  HPI 73 yo here for recheck of MRSA+ abscess that was on her buttocks.  Area is much better per pt.  She is still keeping it covered but there is no drainage.  She reports there is a small firm area in the very middle that she would like me to look at.  Not feeling great today.  Very busy with work and mother.  Hasn't eaten or drunk anything today and it is almost 1pm.  Lots of stress due to mother's care.  D/W pt importance of prioritizing self.  I know this is difficult due to the amount of care she is giving her mother.  She states she will try.  Feels fine to drive.  Review of Systems  All other systems reviewed and are negative.      Objective:   Physical Exam  Constitutional: She is oriented to person, place, and time. She appears well-developed and well-nourished.  Genitourinary:     Neurological: She is alert and oriented to person, place, and time.  Skin: Skin is warm.  Psychiatric: She has a normal mood and affect.       Assessment:     Well healed MRSA + buttocks abscess     Plan:     Mupirocin ointment to nares daily for 7 days to see if this will help at all with decreasing frequently of abscesses.  (pt has had 2 in last year.) Rx for doxycycline 100mg  bid x 7 days.  Pt knows if symptoms restart, she needs to go immediately to get abx and will call to be seen.

## 2013-12-22 ENCOUNTER — Encounter: Payer: Self-pay | Admitting: Obstetrics & Gynecology

## 2013-12-23 ENCOUNTER — Ambulatory Visit (INDEPENDENT_AMBULATORY_CARE_PROVIDER_SITE_OTHER): Payer: Medicare Other | Admitting: Licensed Clinical Social Worker

## 2013-12-23 DIAGNOSIS — IMO0002 Reserved for concepts with insufficient information to code with codable children: Secondary | ICD-10-CM | POA: Diagnosis not present

## 2013-12-31 DIAGNOSIS — Z23 Encounter for immunization: Secondary | ICD-10-CM | POA: Diagnosis not present

## 2014-01-01 ENCOUNTER — Telehealth: Payer: Self-pay | Admitting: Obstetrics & Gynecology

## 2014-01-01 NOTE — Telephone Encounter (Signed)
Spoke with patient. Patient would like to know how long she is supposed to use the nasal medication Dr.Miller gave her for MRSA. Advised patient per office visit note patient was to use for 7 days. Patient is agreeable and states that she has already done this but wanted to check. Office visit note from [place] seen below.  Mupirocin ointment to nares daily for 7 days to see if this will help at all with decreasing frequently of abscesses. (pt has had 2 in last year.)  Routing to provider for final review. Patient agreeable to disposition. Will close encounter

## 2014-01-01 NOTE — Telephone Encounter (Signed)
Pt calling to speak with nurse about the medicine Dr Sabra Heck gave her for mrsa.

## 2014-01-20 ENCOUNTER — Ambulatory Visit (INDEPENDENT_AMBULATORY_CARE_PROVIDER_SITE_OTHER): Payer: Medicare Other | Admitting: Licensed Clinical Social Worker

## 2014-01-20 DIAGNOSIS — IMO0002 Reserved for concepts with insufficient information to code with codable children: Secondary | ICD-10-CM | POA: Diagnosis not present

## 2014-02-08 ENCOUNTER — Encounter: Payer: Self-pay | Admitting: Obstetrics & Gynecology

## 2014-02-08 ENCOUNTER — Ambulatory Visit (INDEPENDENT_AMBULATORY_CARE_PROVIDER_SITE_OTHER): Payer: Medicare Other | Admitting: Obstetrics & Gynecology

## 2014-02-08 ENCOUNTER — Ambulatory Visit (INDEPENDENT_AMBULATORY_CARE_PROVIDER_SITE_OTHER): Payer: Medicare Other | Admitting: Licensed Clinical Social Worker

## 2014-02-08 VITALS — BP 112/70 | HR 68 | Ht 61.75 in | Wt 133.0 lb

## 2014-02-08 DIAGNOSIS — F3341 Major depressive disorder, recurrent, in partial remission: Secondary | ICD-10-CM

## 2014-02-08 DIAGNOSIS — E038 Other specified hypothyroidism: Secondary | ICD-10-CM | POA: Diagnosis not present

## 2014-02-08 DIAGNOSIS — N993 Prolapse of vaginal vault after hysterectomy: Secondary | ICD-10-CM | POA: Diagnosis not present

## 2014-02-08 DIAGNOSIS — R55 Syncope and collapse: Secondary | ICD-10-CM | POA: Diagnosis not present

## 2014-02-08 NOTE — Progress Notes (Signed)
Patient ID: MARNEY TRELOAR, female   DOB: Apr 24, 1941, 73 y.o.   MRN: [id number]  73 y.o. Married White female G0P0000 here for pessary check.  Doing well with pessary.  Needs a "new one" due to staining of her old one.  This was ordered for her and I will replace her current one with a new one.    Pt reports she "isn't feeling well today".  She's had several episodes of feeling some anxiety and then getting light headed and feeling like she might pass out.  She hasn't had a full syncopal episode yet.  She would like some blood work today.  BP is fine.  Does have a lot of stressors at home.  Just finished seeing psychiatrist this morning.  No adjustments to medications made.    ROS: no vaginal discharge, no urinary symptoms, no pelvic pain  Exam:   BP 112/70  Pulse 68  Ht 5' 1.75" (1.568 m)  Wt 133 lb (60.328 kg)  BMI 24.54 kg/m2 General appearance: alert and cooperative Inguinal adenopathy: neg   Pelvic: External genitalia:  no lesions              Urethra: normal appearing urethra with no masses, tenderness or lesions              Bartholins and Skenes: Bartholin's, Urethra, Skene's normal                 Vagina: atrophic.  After pessary removal, ulceration at apex present from pressure from pessary.  No bleeding.              Cervix: absent Bimanual Exam:  Uterus:  Surgically absent                               Adnexa:    not indicated                               Anus:  defer exam  Pessary was removed without difficulty. New #4 incontinence ring with support placed.    Patient tolerated procedure well.    A: Vaginal vault prolapse     Pre-syncopal episodes     Anxiety  P:  Return to office in 3-4 months for recheck. CBC, CMP, TSH today  An After Visit Summary was printed and given to the patient.

## 2014-02-09 LAB — CBC
HEMATOCRIT: 40.1 % (ref 36.0–46.0)
HEMOGLOBIN: 13.3 g/dL (ref 12.0–15.0)
MCH: 29.8 pg (ref 26.0–34.0)
MCHC: 33.2 g/dL (ref 30.0–36.0)
MCV: 89.7 fL (ref 78.0–100.0)
Platelets: 207 10*3/uL (ref 150–400)
RBC: 4.47 MIL/uL (ref 3.87–5.11)
RDW: 13 % (ref 11.5–15.5)
WBC: 4.7 10*3/uL (ref 4.0–10.5)

## 2014-02-09 LAB — COMPREHENSIVE METABOLIC PANEL
ALK PHOS: 63 U/L (ref 39–117)
ALT: 12 U/L (ref 0–35)
AST: 18 U/L (ref 0–37)
Albumin: 3.9 g/dL (ref 3.5–5.2)
BILIRUBIN TOTAL: 0.8 mg/dL (ref 0.2–1.2)
BUN: 24 mg/dL — AB (ref 6–23)
CO2: 34 mEq/L — ABNORMAL HIGH (ref 19–32)
Calcium: 9.3 mg/dL (ref 8.4–10.5)
Chloride: 102 mEq/L (ref 96–112)
Creat: 1.01 mg/dL (ref 0.50–1.10)
Glucose, Bld: 93 mg/dL (ref 70–99)
Potassium: 4.9 mEq/L (ref 3.5–5.3)
Sodium: 142 mEq/L (ref 135–145)
Total Protein: 6.2 g/dL (ref 6.0–8.3)

## 2014-02-09 LAB — TSH: TSH: 0.885 u[IU]/mL (ref 0.350–4.500)

## 2014-02-10 DIAGNOSIS — M25552 Pain in left hip: Secondary | ICD-10-CM | POA: Diagnosis not present

## 2014-02-10 DIAGNOSIS — M25551 Pain in right hip: Secondary | ICD-10-CM | POA: Diagnosis not present

## 2014-02-10 DIAGNOSIS — M169 Osteoarthritis of hip, unspecified: Secondary | ICD-10-CM | POA: Diagnosis not present

## 2014-02-10 DIAGNOSIS — M25511 Pain in right shoulder: Secondary | ICD-10-CM | POA: Diagnosis not present

## 2014-02-12 ENCOUNTER — Telehealth: Payer: Self-pay | Admitting: Obstetrics & Gynecology

## 2014-02-12 NOTE — Telephone Encounter (Signed)
Sent results to [place] to call.  Thanks.  Encounter closed.

## 2014-02-12 NOTE — Telephone Encounter (Signed)
Pt is calling to get results from her recent visit.

## 2014-02-12 NOTE — Telephone Encounter (Signed)
Dr.Miller, please review and advise labs from 10/5. Thank you.

## 2014-02-15 ENCOUNTER — Telehealth: Payer: Self-pay | Admitting: Obstetrics & Gynecology

## 2014-02-15 DIAGNOSIS — M25511 Pain in right shoulder: Secondary | ICD-10-CM | POA: Diagnosis not present

## 2014-02-15 DIAGNOSIS — M6281 Muscle weakness (generalized): Secondary | ICD-10-CM | POA: Diagnosis not present

## 2014-02-15 NOTE — Telephone Encounter (Signed)
Message copied by Michele Mcalpine on Mon Feb 15, 2014 10:36 AM ------      Message from: Megan Salon      Created: Mon Feb 15, 2014  6:25 AM       Inform pt one renal function is mildly elevated.  Needs to be repeated in 2-4 weeks.  CBC normal.  TSH normal.  If symptoms continue, needs to follow up with Dr. Minna Antis.  We can send him the lab results if she is going to see him so he can repeat the CMP.  Otherwise, I need to put in the lab order.  Thanks. ------

## 2014-02-15 NOTE — Telephone Encounter (Addendum)
Pt is calling to get her results she states this is her third call and no one has called her back.

## 2014-02-15 NOTE — Telephone Encounter (Signed)
Message left to return call to [place] at [phone] can ask to speak with Olivia Mackie or Claiborne Billings.

## 2014-02-22 ENCOUNTER — Ambulatory Visit (INDEPENDENT_AMBULATORY_CARE_PROVIDER_SITE_OTHER): Payer: Medicare Other | Admitting: Licensed Clinical Social Worker

## 2014-02-22 DIAGNOSIS — F3341 Major depressive disorder, recurrent, in partial remission: Secondary | ICD-10-CM

## 2014-02-23 NOTE — Telephone Encounter (Signed)
Notes Recorded by Lyman Speller, MD on 02/17/2014 at 1:10 PM Just send labs from visit Notes Recorded by Maryann Conners, CMA on 02/15/2014 at 10:53 AM Patient notified of results. States will have appointment in November with Dr Minna Antis and can have lab rechecked then. Also, scheduled next appointment for pessary check. Please advise what needs to be sent to Dr Wilmon Pali office besides order for lab, if any.//kn Notes Recorded by Karen Chafe, RN on 02/15/2014 at 10:39 AM Message left to return call to [place] at [phone].    Notes Recorded by Lyman Speller, MD on 02/15/2014 at 6:25 AM Inform pt one renal function is mildly elevated. Needs to be repeated in 2-4 weeks. CBC normal. TSH normal. If symptoms continue, needs to follow up with Dr. Minna Antis. We can send him the lab results if she is going to see him so he can repeat the CMP. Otherwise, I need to put in the lab order. Thanks.

## 2014-03-03 ENCOUNTER — Ambulatory Visit: Payer: Medicare Other | Admitting: Licensed Clinical Social Worker

## 2014-03-10 ENCOUNTER — Telehealth: Payer: Self-pay | Admitting: Obstetrics & Gynecology

## 2014-03-10 ENCOUNTER — Ambulatory Visit (INDEPENDENT_AMBULATORY_CARE_PROVIDER_SITE_OTHER): Payer: Medicare Other | Admitting: Licensed Clinical Social Worker

## 2014-03-10 DIAGNOSIS — F3341 Major depressive disorder, recurrent, in partial remission: Secondary | ICD-10-CM | POA: Diagnosis not present

## 2014-03-10 NOTE — Telephone Encounter (Signed)
Called patient and left detailed message. Advised Dr. Quincy Simmonds does recommend office visit for patient for evaluation.  Will review chart and symptoms with Dr. Sabra Heck in the morning to ensure disposition.

## 2014-03-10 NOTE — Telephone Encounter (Signed)
Patient calling stating she called earlier and nobody called her back. I do not see any previous phone notes from earlier today. Patient states, "I have a urinary tract infection and I need Dr. Sabra Heck to call something in for it." Patient declined an appointment stating, "Dr. Sabra Heck always treats me over the phone for these because I get them all the time." Please advise?

## 2014-03-10 NOTE — Telephone Encounter (Signed)
Spoke with patient. Patient states "My urine has been dark with a smell for two weeks. I thought it was going to go away but it hasn't." Has slight burning with urination and mild back pain. Denies fevers. "I can't come in today or tomorrow and I work Friday." Advised patient of importance of being seen for urine culture so that we can treat with proper antibiotics. Patient states that she gets these often and Dr.Miller always treats her over the phone. Advised patient this is not normally our protocol but I will send a message to the provider and return call with further recommendations. Advised may need to be seen at local urgent care for proper treatment as we would not want this to cause kidney problems.   Dr.Silva, please advise as Dr.Miller is out of the office today Cc: Dr.Miller

## 2014-03-11 MED ORDER — CIPROFLOXACIN HCL 500 MG PO TABS: 500.0000 mg | ORAL_TABLET | Freq: Two times a day (BID) | ORAL | Status: DC

## 2014-03-11 NOTE — Telephone Encounter (Signed)
Pt just had a UTI in September that was e coli.  Initial antibiotic did not help.  Cipro did.  OK to call in Cipro 500mg  bid x 7 days.  If symptoms not improved, needs to be seen or at urgent care.  I do NOT frequently call in antibiotics for her.  She will have to be seen if has symptoms again.

## 2014-03-11 NOTE — Telephone Encounter (Signed)
Spoke with patient. Message from Dr. Sabra Heck given. Patient agreeable to instructions and verbalized understanding of need to follow up if not improved.  Patient agreeable to this. Will follow up with our office or urgent care.  Cipro sent to [facility].  Will close encounter.

## 2014-03-16 DIAGNOSIS — E0500 Thyrotoxicosis with diffuse goiter without thyrotoxic crisis or storm: Secondary | ICD-10-CM | POA: Diagnosis not present

## 2014-03-16 DIAGNOSIS — H25813 Combined forms of age-related cataract, bilateral: Secondary | ICD-10-CM | POA: Diagnosis not present

## 2014-03-16 DIAGNOSIS — H04123 Dry eye syndrome of bilateral lacrimal glands: Secondary | ICD-10-CM | POA: Diagnosis not present

## 2014-03-16 DIAGNOSIS — H3531 Nonexudative age-related macular degeneration: Secondary | ICD-10-CM | POA: Diagnosis not present

## 2014-03-16 DIAGNOSIS — D3132 Benign neoplasm of left choroid: Secondary | ICD-10-CM | POA: Diagnosis not present

## 2014-03-17 DIAGNOSIS — M7531 Calcific tendinitis of right shoulder: Secondary | ICD-10-CM | POA: Diagnosis not present

## 2014-03-24 ENCOUNTER — Ambulatory Visit (INDEPENDENT_AMBULATORY_CARE_PROVIDER_SITE_OTHER): Payer: Medicare Other | Admitting: Licensed Clinical Social Worker

## 2014-03-24 DIAGNOSIS — N390 Urinary tract infection, site not specified: Secondary | ICD-10-CM | POA: Diagnosis not present

## 2014-03-24 DIAGNOSIS — Z0000 Encounter for general adult medical examination without abnormal findings: Secondary | ICD-10-CM | POA: Diagnosis not present

## 2014-03-24 DIAGNOSIS — E559 Vitamin D deficiency, unspecified: Secondary | ICD-10-CM | POA: Diagnosis not present

## 2014-03-24 DIAGNOSIS — E780 Pure hypercholesterolemia: Secondary | ICD-10-CM | POA: Diagnosis not present

## 2014-03-24 DIAGNOSIS — F3341 Major depressive disorder, recurrent, in partial remission: Secondary | ICD-10-CM | POA: Diagnosis not present

## 2014-03-24 DIAGNOSIS — E039 Hypothyroidism, unspecified: Secondary | ICD-10-CM | POA: Diagnosis not present

## 2014-03-29 ENCOUNTER — Other Ambulatory Visit: Payer: Self-pay | Admitting: Internal Medicine

## 2014-03-29 DIAGNOSIS — E780 Pure hypercholesterolemia: Secondary | ICD-10-CM | POA: Diagnosis not present

## 2014-03-29 DIAGNOSIS — R0602 Shortness of breath: Secondary | ICD-10-CM | POA: Diagnosis not present

## 2014-03-29 DIAGNOSIS — F329 Major depressive disorder, single episode, unspecified: Secondary | ICD-10-CM | POA: Diagnosis not present

## 2014-03-29 DIAGNOSIS — E039 Hypothyroidism, unspecified: Secondary | ICD-10-CM | POA: Diagnosis not present

## 2014-03-29 DIAGNOSIS — Z139 Encounter for screening, unspecified: Secondary | ICD-10-CM

## 2014-03-30 ENCOUNTER — Telehealth: Payer: Self-pay | Admitting: Obstetrics & Gynecology

## 2014-03-30 NOTE — Telephone Encounter (Signed)
Left message to call back. All records for a fee or last 2 visits free?

## 2014-03-30 NOTE — Telephone Encounter (Signed)
Records faxed per request.

## 2014-03-30 NOTE — Telephone Encounter (Signed)
Patient returned call and just needs any records pertaining to MRSA.

## 2014-03-31 DIAGNOSIS — E785 Hyperlipidemia, unspecified: Secondary | ICD-10-CM | POA: Insufficient documentation

## 2014-03-31 DIAGNOSIS — K219 Gastro-esophageal reflux disease without esophagitis: Secondary | ICD-10-CM | POA: Insufficient documentation

## 2014-03-31 DIAGNOSIS — M810 Age-related osteoporosis without current pathological fracture: Secondary | ICD-10-CM | POA: Insufficient documentation

## 2014-03-31 DIAGNOSIS — F17200 Nicotine dependence, unspecified, uncomplicated: Secondary | ICD-10-CM | POA: Insufficient documentation

## 2014-03-31 DIAGNOSIS — Z8619 Personal history of other infectious and parasitic diseases: Secondary | ICD-10-CM | POA: Insufficient documentation

## 2014-03-31 DIAGNOSIS — Z853 Personal history of malignant neoplasm of breast: Secondary | ICD-10-CM | POA: Insufficient documentation

## 2014-04-12 ENCOUNTER — Other Ambulatory Visit: Payer: Medicare Other

## 2014-04-12 DIAGNOSIS — N390 Urinary tract infection, site not specified: Secondary | ICD-10-CM | POA: Diagnosis not present

## 2014-04-12 DIAGNOSIS — E785 Hyperlipidemia, unspecified: Secondary | ICD-10-CM | POA: Diagnosis not present

## 2014-04-12 DIAGNOSIS — N3941 Urge incontinence: Secondary | ICD-10-CM | POA: Diagnosis not present

## 2014-04-12 DIAGNOSIS — H25811 Combined forms of age-related cataract, right eye: Secondary | ICD-10-CM | POA: Diagnosis not present

## 2014-04-12 DIAGNOSIS — F329 Major depressive disorder, single episode, unspecified: Secondary | ICD-10-CM | POA: Diagnosis not present

## 2014-04-12 DIAGNOSIS — Z6824 Body mass index (BMI) 24.0-24.9, adult: Secondary | ICD-10-CM | POA: Diagnosis not present

## 2014-04-12 DIAGNOSIS — Z720 Tobacco use: Secondary | ICD-10-CM | POA: Diagnosis not present

## 2014-04-12 DIAGNOSIS — M810 Age-related osteoporosis without current pathological fracture: Secondary | ICD-10-CM | POA: Diagnosis not present

## 2014-04-12 DIAGNOSIS — E0500 Thyrotoxicosis with diffuse goiter without thyrotoxic crisis or storm: Secondary | ICD-10-CM | POA: Diagnosis not present

## 2014-04-12 DIAGNOSIS — K219 Gastro-esophageal reflux disease without esophagitis: Secondary | ICD-10-CM | POA: Diagnosis not present

## 2014-04-12 DIAGNOSIS — E0590 Thyrotoxicosis, unspecified without thyrotoxic crisis or storm: Secondary | ICD-10-CM | POA: Diagnosis not present

## 2014-04-12 HISTORY — PX: CATARACT EXTRACTION: SUR2

## 2014-04-14 ENCOUNTER — Ambulatory Visit: Payer: Medicare Other | Admitting: Licensed Clinical Social Worker

## 2014-04-21 ENCOUNTER — Ambulatory Visit (INDEPENDENT_AMBULATORY_CARE_PROVIDER_SITE_OTHER): Payer: Medicare Other | Admitting: Licensed Clinical Social Worker

## 2014-04-21 DIAGNOSIS — F3341 Major depressive disorder, recurrent, in partial remission: Secondary | ICD-10-CM | POA: Diagnosis not present

## 2014-05-03 ENCOUNTER — Telehealth: Payer: Self-pay | Admitting: Obstetrics & Gynecology

## 2014-05-03 NOTE — Telephone Encounter (Signed)
Patient cancelled her appointment for a pessary check on 05/17/14 due to has to be with her mom that afternoon. Please look at Dr. Ammie Ferrier schedule and advise when patient may reschedule to. I can call her.

## 2014-05-05 DIAGNOSIS — L570 Actinic keratosis: Secondary | ICD-10-CM | POA: Diagnosis not present

## 2014-05-05 DIAGNOSIS — D179 Benign lipomatous neoplasm, unspecified: Secondary | ICD-10-CM | POA: Diagnosis not present

## 2014-05-10 NOTE — Telephone Encounter (Signed)
Patient is waiting on call back regarding her appointment with Dr.Miller.

## 2014-05-11 NOTE — Telephone Encounter (Signed)
lmtcb-can patient come in 05/17/14 at 10:15 or 10:45?//kn

## 2014-05-11 NOTE — Telephone Encounter (Signed)
Scheduled 06/07/14.

## 2014-05-12 ENCOUNTER — Ambulatory Visit (INDEPENDENT_AMBULATORY_CARE_PROVIDER_SITE_OTHER): Payer: Medicare Other | Admitting: Licensed Clinical Social Worker

## 2014-05-12 DIAGNOSIS — F3341 Major depressive disorder, recurrent, in partial remission: Secondary | ICD-10-CM

## 2014-05-17 ENCOUNTER — Ambulatory Visit: Payer: Medicare Other | Admitting: Obstetrics & Gynecology

## 2014-05-17 ENCOUNTER — Encounter: Payer: Self-pay | Admitting: Obstetrics & Gynecology

## 2014-05-17 ENCOUNTER — Ambulatory Visit (INDEPENDENT_AMBULATORY_CARE_PROVIDER_SITE_OTHER): Payer: Medicare Other | Admitting: Obstetrics & Gynecology

## 2014-05-17 VITALS — BP 104/58 | HR 60 | Resp 12 | Wt 133.0 lb

## 2014-05-17 DIAGNOSIS — N993 Prolapse of vaginal vault after hysterectomy: Secondary | ICD-10-CM | POA: Diagnosis not present

## 2014-05-17 DIAGNOSIS — R8290 Unspecified abnormal findings in urine: Secondary | ICD-10-CM

## 2014-05-17 DIAGNOSIS — N898 Other specified noninflammatory disorders of vagina: Secondary | ICD-10-CM

## 2014-05-17 DIAGNOSIS — R8299 Other abnormal findings in urine: Secondary | ICD-10-CM

## 2014-05-17 MED ORDER — ALPRAZOLAM 1 MG PO TABS: 1.0000 mg | ORAL_TABLET | Freq: Every evening | ORAL | Status: DC | PRN

## 2014-05-17 MED ORDER — METRONIDAZOLE 0.75 % VA GEL: 1.0000 | Freq: Every day | VAGINAL | Status: DC

## 2014-05-17 NOTE — Progress Notes (Signed)
74 y.o. Married White female G0 here for pessary check, removal, pessary cleaning and replacement.  Pt has #4 incontinence ring.  Pt reports she is having increased vaginal discharge.  Denies bleeding.  She is also having a little pelvic pain.  Patient reports she thinks her urine is really cloudy as well.  Denies dysuria.  No fever.  Exam: BP 104/58 mmHg  Pulse 60  Resp 12  Wt 133 lb (60.328 kg) General appearance: alert and cooperative Inguinal adenopathy: no enlarged LAD   Pelvic: External genitalia:  no lesions and atrophic appearance              Urethra: normal appearing urethra with no masses, tenderness or lesions              Bartholins and Skenes: Bartholin's, Urethra, Skene's normal                 Vagina: atrophic, with vaginal ulceration at vaginal apex              Cervix: absent Bimanual Exam:  Uterus:  surgically absent, vaginal cuff well healed                               Adnexa:    not indicated and no masses                           Procedure:  Pessary was removed without difficulty.  Ulceration was noted.  Vaginal discharge present as well.  Feel pessary should stay out until ulceration heals.    Assessment:   Vaginal vault prolapse with cystocele Vaginal ulceration Vaginal discharge c/w BV Cloudy urine Smoker  Plan: Will treat with Metrogel 0.75% qhs x 5 days Xanax 1.0mg  daily prn.  #30/0RF Urine culture   Aftercare summary was provided.

## 2014-05-17 NOTE — Telephone Encounter (Signed)
F/u appointment has been made.//kn

## 2014-05-18 LAB — URINE CULTURE
Colony Count: NO GROWTH
Organism ID, Bacteria: NO GROWTH

## 2014-05-21 ENCOUNTER — Telehealth: Payer: Self-pay

## 2014-05-21 ENCOUNTER — Encounter: Payer: Self-pay | Admitting: Obstetrics & Gynecology

## 2014-05-21 NOTE — Telephone Encounter (Signed)
Lmtcb//kn 

## 2014-05-21 NOTE — Telephone Encounter (Signed)
Patient notified of all results.//kn 

## 2014-05-21 NOTE — Telephone Encounter (Signed)
-----   Message from Lyman Speller, MD sent at 05/20/2014  5:23 PM EST ----- Inform culture is negative for bacteria.  No treatment needed.

## 2014-05-21 NOTE — Telephone Encounter (Signed)
Pt returning call

## 2014-05-25 DIAGNOSIS — H25812 Combined forms of age-related cataract, left eye: Secondary | ICD-10-CM | POA: Diagnosis not present

## 2014-05-25 DIAGNOSIS — Z9841 Cataract extraction status, right eye: Secondary | ICD-10-CM | POA: Diagnosis not present

## 2014-05-25 DIAGNOSIS — D3132 Benign neoplasm of left choroid: Secondary | ICD-10-CM | POA: Diagnosis not present

## 2014-05-25 DIAGNOSIS — Z961 Presence of intraocular lens: Secondary | ICD-10-CM | POA: Diagnosis not present

## 2014-05-25 DIAGNOSIS — H3531 Nonexudative age-related macular degeneration: Secondary | ICD-10-CM | POA: Diagnosis not present

## 2014-05-25 DIAGNOSIS — H04123 Dry eye syndrome of bilateral lacrimal glands: Secondary | ICD-10-CM | POA: Diagnosis not present

## 2014-05-26 ENCOUNTER — Ambulatory Visit (INDEPENDENT_AMBULATORY_CARE_PROVIDER_SITE_OTHER): Payer: Medicare Other | Admitting: Licensed Clinical Social Worker

## 2014-05-26 DIAGNOSIS — F3341 Major depressive disorder, recurrent, in partial remission: Secondary | ICD-10-CM

## 2014-05-27 ENCOUNTER — Ambulatory Visit: Payer: Medicare Other | Admitting: Obstetrics & Gynecology

## 2014-05-27 ENCOUNTER — Telehealth: Payer: Self-pay | Admitting: Obstetrics & Gynecology

## 2014-05-27 NOTE — Telephone Encounter (Signed)
Spoke with patient and rescheduled her for 05/31/13 at 8:30 with Dr. Sabra Heck. Okay to close encounter?

## 2014-05-27 NOTE — Telephone Encounter (Signed)
Pt called to cancel her appointment for today @ 1230 due to inclement weather affecting her roads. States her roads are icy and would like to reschedule her pessary appointment.

## 2014-05-27 NOTE — Telephone Encounter (Signed)
Encounter closed.  Thanks.

## 2014-05-31 ENCOUNTER — Ambulatory Visit: Payer: Medicare Other | Admitting: Obstetrics & Gynecology

## 2014-05-31 ENCOUNTER — Telehealth: Payer: Self-pay | Admitting: Obstetrics & Gynecology

## 2014-05-31 NOTE — Telephone Encounter (Signed)
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 I do not see any slots available that meet that request. Please look at the schedule to see if there is a spot I may have missed? I will monitor for cancellations.  Cc: Gay Filler for schedule monitoring.

## 2014-05-31 NOTE — Telephone Encounter (Signed)
Call to patient. Offered am appointment tomorrow and patient declines due to amount of snow/ice still in her area. Offered Thursday at 945 which patient initially declined but then agreed to. States she will call back if roads are still bad. Appointment 06-03-14 at [facility].  Routing to provider for final review. Patient agreeable to disposition. Will close encounter

## 2014-05-31 NOTE — Telephone Encounter (Signed)
Called a left a message at both numbers for patient to call back to reschedule her missed appointment due to weather for "pessary check."

## 2014-06-03 ENCOUNTER — Ambulatory Visit (INDEPENDENT_AMBULATORY_CARE_PROVIDER_SITE_OTHER): Payer: Medicare Other | Admitting: Obstetrics & Gynecology

## 2014-06-03 VITALS — BP 122/70 | HR 60 | Resp 12 | Wt 134.2 lb

## 2014-06-03 DIAGNOSIS — N765 Ulceration of vagina: Secondary | ICD-10-CM | POA: Diagnosis not present

## 2014-06-03 DIAGNOSIS — N8110 Cystocele, unspecified: Secondary | ICD-10-CM

## 2014-06-03 DIAGNOSIS — IMO0002 Reserved for concepts with insufficient information to code with codable children: Secondary | ICD-10-CM

## 2014-06-06 ENCOUNTER — Encounter: Payer: Self-pay | Admitting: Obstetrics & Gynecology

## 2014-06-06 DIAGNOSIS — IMO0002 Reserved for concepts with insufficient information to code with codable children: Secondary | ICD-10-CM | POA: Insufficient documentation

## 2014-06-06 NOTE — Progress Notes (Signed)
74 y.o. MarriedWhite female G0P0000 here for pessary replacement.  Pt was seen 05/17/14 for pessary check and a vaginal ulceration was noted.  Pt was having increased discharge as well.  Due to this, pessary was removed.  Pt was treated with Metrogel.  She states she really does have a lot of urinary urgency when the pessary is removed.  Discharge has resolved.  Denies VB.  Pt reports she is still having some mild pelvic pain but she feels this way almost every time the pessary is out for any extended period of time.  ROS: no abnormal bleeding, no dysuria, trouble voiding or hematuria positive for urinary frequency/urgency  Exam:   BP 122/70 mmHg  Pulse 60  Resp 12  Wt 134 lb 3.2 oz (60.873 kg) General appearance: alert, cooperative and appears stated age Inguinal adenopathy: neg   Pelvic: External genitalia:  no lesions              Urethra: normal appearing urethra with no masses, tenderness or lesions              Bartholins and Skenes: normal, Bartholin's, Urethra, Skene's normal                 Vagina: normal appearing vagina with normal color and discharge, no lesions              Cervix: absent   Uterus:  surgically absent, vaginal cuff well healed               Adnexa:    no masses and surgically absent bilateral  #4 incontinence ring was replaced without difficulty.  Will plan to order #3 for when pt returns for neck recheck.  All questions answered.  A: Cystocele- symptomatic      Vaginal ulceration due to pessary that has healed      Remote clot hx--cannot use estrogens  P:  Return to office in 3-4 months for recheck.       Will plan to order #3 incontinence ring for trial with next visit  An After Visit Summary was printed and given to the patient.

## 2014-06-07 ENCOUNTER — Ambulatory Visit: Payer: Medicare Other | Admitting: Obstetrics & Gynecology

## 2014-06-07 HISTORY — PX: CATARACT EXTRACTION: SUR2

## 2014-06-08 DIAGNOSIS — F17210 Nicotine dependence, cigarettes, uncomplicated: Secondary | ICD-10-CM | POA: Diagnosis not present

## 2014-06-08 DIAGNOSIS — E785 Hyperlipidemia, unspecified: Secondary | ICD-10-CM | POA: Diagnosis not present

## 2014-06-08 DIAGNOSIS — K219 Gastro-esophageal reflux disease without esophagitis: Secondary | ICD-10-CM | POA: Diagnosis not present

## 2014-06-08 DIAGNOSIS — M810 Age-related osteoporosis without current pathological fracture: Secondary | ICD-10-CM | POA: Diagnosis not present

## 2014-06-08 DIAGNOSIS — M1990 Unspecified osteoarthritis, unspecified site: Secondary | ICD-10-CM | POA: Diagnosis not present

## 2014-06-08 DIAGNOSIS — E0500 Thyrotoxicosis with diffuse goiter without thyrotoxic crisis or storm: Secondary | ICD-10-CM | POA: Diagnosis not present

## 2014-06-08 DIAGNOSIS — Z882 Allergy status to sulfonamides status: Secondary | ICD-10-CM | POA: Diagnosis not present

## 2014-06-08 DIAGNOSIS — H25812 Combined forms of age-related cataract, left eye: Secondary | ICD-10-CM | POA: Diagnosis not present

## 2014-06-08 DIAGNOSIS — Z853 Personal history of malignant neoplasm of breast: Secondary | ICD-10-CM | POA: Diagnosis not present

## 2014-06-09 ENCOUNTER — Ambulatory Visit: Payer: Medicare Other | Admitting: Licensed Clinical Social Worker

## 2014-06-23 ENCOUNTER — Ambulatory Visit: Payer: Medicare Other | Admitting: Licensed Clinical Social Worker

## 2014-06-28 DIAGNOSIS — E0500 Thyrotoxicosis with diffuse goiter without thyrotoxic crisis or storm: Secondary | ICD-10-CM | POA: Diagnosis not present

## 2014-06-28 DIAGNOSIS — F17210 Nicotine dependence, cigarettes, uncomplicated: Secondary | ICD-10-CM | POA: Diagnosis not present

## 2014-06-28 DIAGNOSIS — H25812 Combined forms of age-related cataract, left eye: Secondary | ICD-10-CM | POA: Diagnosis not present

## 2014-07-07 ENCOUNTER — Ambulatory Visit: Payer: Medicare Other | Admitting: Licensed Clinical Social Worker

## 2014-07-07 DIAGNOSIS — Z961 Presence of intraocular lens: Secondary | ICD-10-CM | POA: Diagnosis not present

## 2014-07-07 DIAGNOSIS — Z9842 Cataract extraction status, left eye: Secondary | ICD-10-CM | POA: Diagnosis not present

## 2014-07-15 ENCOUNTER — Other Ambulatory Visit: Payer: Self-pay

## 2014-07-15 DIAGNOSIS — Z9889 Other specified postprocedural states: Secondary | ICD-10-CM

## 2014-07-15 DIAGNOSIS — Z1231 Encounter for screening mammogram for malignant neoplasm of breast: Secondary | ICD-10-CM

## 2014-07-15 DIAGNOSIS — Z853 Personal history of malignant neoplasm of breast: Secondary | ICD-10-CM

## 2014-07-19 ENCOUNTER — Telehealth: Payer: Self-pay | Admitting: Obstetrics & Gynecology

## 2014-07-19 NOTE — Telephone Encounter (Signed)
Patient was last seen with Dr.Miller on 06/03/2014 with Dr.Miller for pessary check. 3 month recheck appointment scheduled for 08/30/2014 at 1:30pm with Dr.Miller. Patient is agreeable to date and time.  Routing to provider for final review. Patient agreeable to disposition. Will close encounter

## 2014-07-19 NOTE — Telephone Encounter (Signed)
Pt called to schedule her 3 month pessary check/cleaning with Dr Sabra Heck. Pt states Mondays and Thursdays are best due to transportation.

## 2014-07-21 ENCOUNTER — Ambulatory Visit: Payer: No Typology Code available for payment source | Admitting: Licensed Clinical Social Worker

## 2014-07-26 ENCOUNTER — Telehealth: Payer: Self-pay | Admitting: Obstetrics & Gynecology

## 2014-07-26 ENCOUNTER — Ambulatory Visit (INDEPENDENT_AMBULATORY_CARE_PROVIDER_SITE_OTHER): Payer: Medicare Other | Admitting: Licensed Clinical Social Worker

## 2014-07-26 DIAGNOSIS — F3341 Major depressive disorder, recurrent, in partial remission: Secondary | ICD-10-CM

## 2014-07-26 MED ORDER — DOXYCYCLINE HYCLATE 100 MG PO CAPS: 100.0000 mg | ORAL_CAPSULE | Freq: Two times a day (BID) | ORAL | Status: DC

## 2014-07-26 NOTE — Telephone Encounter (Signed)
Spoke with patient. Patient states that she began to have increased urinary frequency two days ago. "I know when I have a UTI. I am going to the bathroom a lot and my urine is dark." Denies any lower back pain, fevers, or chills. Has slight burning with urination. Patient declines an appointment at this time. "Sometimes when I call in [place] will send me in a prescription for Cipro since I have had these a lot before." Advised patient will need to speak with Dr.Miller upon return from OR and return call with further recommendations. Patient is agreeable.

## 2014-07-26 NOTE — Telephone Encounter (Signed)
Spoke with patient. Advised of message as seen below from [place]. Patient is agreeable and verbalizes understanding. Rx for Doxycyline 100mg  bid #14 0RF sent to pharmacy on file. Patient will call back tomorrow with an update.  Routing to provider for final review. Patient agreeable to disposition. Will close encounter

## 2014-07-26 NOTE — Telephone Encounter (Signed)
Patient is asking for a prescription for a UTI. Patient declined an appointment today. Last seen 06/03/14.

## 2014-07-26 NOTE — Telephone Encounter (Signed)
Left message to call Collene Massimino at [phone]. 

## 2014-07-26 NOTE — Telephone Encounter (Signed)
Last urine culture was MRSA +.  Was treated with doxycycline 100mg  bid x 7 days.  Ok to start.  Pt needs to call us tomorrow to make sure improved.  If not, will need urine culture.

## 2014-07-26 NOTE — Telephone Encounter (Signed)
Returning a call to patient. °

## 2014-07-28 DIAGNOSIS — H04123 Dry eye syndrome of bilateral lacrimal glands: Secondary | ICD-10-CM | POA: Diagnosis not present

## 2014-07-28 DIAGNOSIS — Z9842 Cataract extraction status, left eye: Secondary | ICD-10-CM | POA: Diagnosis not present

## 2014-07-28 DIAGNOSIS — E0500 Thyrotoxicosis with diffuse goiter without thyrotoxic crisis or storm: Secondary | ICD-10-CM | POA: Diagnosis not present

## 2014-07-28 DIAGNOSIS — H3531 Nonexudative age-related macular degeneration: Secondary | ICD-10-CM | POA: Diagnosis not present

## 2014-07-28 DIAGNOSIS — D3132 Benign neoplasm of left choroid: Secondary | ICD-10-CM | POA: Diagnosis not present

## 2014-07-29 ENCOUNTER — Ambulatory Visit
Admission: RE | Admit: 2014-07-29 | Discharge: 2014-07-29 | Disposition: A | Discharge status: Home / Self Care | Payer: Medicare Other | Source: Ambulatory Visit

## 2014-07-29 DIAGNOSIS — Z9889 Other specified postprocedural states: Secondary | ICD-10-CM

## 2014-07-29 DIAGNOSIS — Z1231 Encounter for screening mammogram for malignant neoplasm of breast: Secondary | ICD-10-CM | POA: Diagnosis not present

## 2014-07-29 DIAGNOSIS — Z853 Personal history of malignant neoplasm of breast: Secondary | ICD-10-CM

## 2014-08-09 ENCOUNTER — Ambulatory Visit (INDEPENDENT_AMBULATORY_CARE_PROVIDER_SITE_OTHER): Payer: Medicare Other | Admitting: Licensed Clinical Social Worker

## 2014-08-09 DIAGNOSIS — F3341 Major depressive disorder, recurrent, in partial remission: Secondary | ICD-10-CM

## 2014-08-19 ENCOUNTER — Ambulatory Visit
Admission: RE | Admit: 2014-08-19 | Discharge: 2014-08-19 | Disposition: A | Discharge status: Home / Self Care | Payer: Medicare Other | Source: Ambulatory Visit | Attending: Internal Medicine | Admitting: Internal Medicine

## 2014-08-19 DIAGNOSIS — Z87891 Personal history of nicotine dependence: Secondary | ICD-10-CM | POA: Diagnosis not present

## 2014-08-19 DIAGNOSIS — J432 Centrilobular emphysema: Secondary | ICD-10-CM | POA: Diagnosis not present

## 2014-08-19 DIAGNOSIS — R918 Other nonspecific abnormal finding of lung field: Secondary | ICD-10-CM | POA: Diagnosis not present

## 2014-08-19 DIAGNOSIS — Z139 Encounter for screening, unspecified: Secondary | ICD-10-CM

## 2014-08-19 DIAGNOSIS — Z122 Encounter for screening for malignant neoplasm of respiratory organs: Secondary | ICD-10-CM | POA: Diagnosis not present

## 2014-08-23 ENCOUNTER — Encounter: Payer: Self-pay | Admitting: Acute Care

## 2014-08-30 ENCOUNTER — Ambulatory Visit (INDEPENDENT_AMBULATORY_CARE_PROVIDER_SITE_OTHER): Payer: Medicare Other | Admitting: Obstetrics & Gynecology

## 2014-08-30 ENCOUNTER — Ambulatory Visit (INDEPENDENT_AMBULATORY_CARE_PROVIDER_SITE_OTHER): Payer: Medicare Other | Admitting: Licensed Clinical Social Worker

## 2014-08-30 ENCOUNTER — Encounter: Payer: Self-pay | Admitting: Obstetrics & Gynecology

## 2014-08-30 VITALS — BP 96/60 | HR 64 | Resp 16 | Wt 139.0 lb

## 2014-08-30 DIAGNOSIS — F3341 Major depressive disorder, recurrent, in partial remission: Secondary | ICD-10-CM

## 2014-08-30 DIAGNOSIS — N8110 Cystocele, unspecified: Secondary | ICD-10-CM

## 2014-08-30 DIAGNOSIS — L929 Granulomatous disorder of the skin and subcutaneous tissue, unspecified: Secondary | ICD-10-CM

## 2014-08-30 DIAGNOSIS — IMO0002 Reserved for concepts with insufficient information to code with codable children: Secondary | ICD-10-CM

## 2014-08-31 ENCOUNTER — Encounter: Payer: Self-pay | Admitting: Obstetrics & Gynecology

## 2014-08-31 NOTE — Progress Notes (Signed)
74 y.o. Married White female G0P0000 here for pessary check.  Patient has been using following pessary style and size:  #3 incontinence ring with support.  She is sexually active.  She describes the following issues with the pessary:  Increased discharge and some vaginal bleeding.  Denies vaginal odor.  Does not remove the pessary with sexual activity.  No back pain.  No urinary symptoms.  No fever.  Discharge has been intermittent for several weeks now.  Reports some increased issues with LE edema but when she takes two LAsix, she has some lightheadedness.  D/W pt possibility this is making her hypotensive.  She has appt with Dr. Minna Antis in three weeks and will discuss with him then.  ROS: Complete ROS questions were negative except for above.    Exam:   BP 96/60 mmHg  Pulse 64  Resp 16  Wt 139 lb (63.05 kg) General appearance: alert and cooperative Inguinal adenopathy: neg   Pelvic: External genitalia:  normal escutcheon              Urethra: normal appearing urethra with no masses, tenderness or lesions              Bartholins and Skenes: Bartholin's, Urethra, Skene's normal                 Vagina: normal appearing vagina with normal color and discharge, significant granulation tissue is present along the cuff and left side wall             Cervix: absent Bimanual Exam:  Uterus:  Surgically absent                               Adnexa:    no masses                               Anus:  defer exam  Pessary was removed without difficulty.  Pessary was cleansed.  Pessary was not replaced due to granulation tissue. Patient tolerated procedure well.    A: Cystocele- currently asymptomatic    Vaginal granulation tissue  P: Pt will leave out pessary.  A #2 will be ordered for her to see if this still helps symptoms but gives the support she needs.  Pt will use metrogel every other night for about a weeks.  She has some "left over" at home.  Return to office in 1 months for recheck.           An  After Visit Summary was printed and given to the patient.

## 2014-09-20 ENCOUNTER — Ambulatory Visit (INDEPENDENT_AMBULATORY_CARE_PROVIDER_SITE_OTHER): Payer: Medicare Other | Admitting: Licensed Clinical Social Worker

## 2014-09-20 ENCOUNTER — Ambulatory Visit (INDEPENDENT_AMBULATORY_CARE_PROVIDER_SITE_OTHER): Payer: Medicare Other | Admitting: Obstetrics & Gynecology

## 2014-09-20 VITALS — BP 100/70 | HR 66 | Ht 61.75 in | Wt 137.4 lb

## 2014-09-20 DIAGNOSIS — F3341 Major depressive disorder, recurrent, in partial remission: Secondary | ICD-10-CM

## 2014-09-20 DIAGNOSIS — N3090 Cystitis, unspecified without hematuria: Secondary | ICD-10-CM | POA: Diagnosis not present

## 2014-09-20 DIAGNOSIS — Z0000 Encounter for general adult medical examination without abnormal findings: Secondary | ICD-10-CM | POA: Diagnosis not present

## 2014-09-20 DIAGNOSIS — IMO0002 Reserved for concepts with insufficient information to code with codable children: Secondary | ICD-10-CM

## 2014-09-20 DIAGNOSIS — R300 Dysuria: Secondary | ICD-10-CM

## 2014-09-20 DIAGNOSIS — N8110 Cystocele, unspecified: Secondary | ICD-10-CM

## 2014-09-20 DIAGNOSIS — E039 Hypothyroidism, unspecified: Secondary | ICD-10-CM | POA: Diagnosis not present

## 2014-09-20 LAB — POCT URINALYSIS DIPSTICK
Urobilinogen, UA: NEGATIVE
pH, UA: 5

## 2014-09-20 MED ORDER — DOXYCYCLINE HYCLATE 100 MG PO CAPS: 100.0000 mg | ORAL_CAPSULE | Freq: Two times a day (BID) | ORAL | Status: DC

## 2014-09-20 NOTE — Progress Notes (Signed)
Subjective:     Patient ID: Victoria Sims, female   DOB: 20-Feb-1941, 74 y.o.   MRN: [id number]  HPI 73 yo G0 MWF here for replacement of new pessary.  Pt has been having increased issues with vaginal discharge due to her #4 incontinence ring with support.  Had ordered a #3 with support to see if this would help.  Pt reports having increased voiding issues with pessary out.  She really does feel "better" with the pessary and has an easier time voiding.  Reports the discharge has competently resolved.    She is now having increased dysuria and urgency.  Denies fever.  Is having a little back pain.  Pt has hx of MRSA UTI.  No vaginal bleeding.     Review of Systems  All other systems reviewed and are negative.      Objective:   Physical Exam  Constitutional: She appears well-developed and well-nourished.  Abdominal: Soft. Bowel sounds are normal. She exhibits no distension. There is tenderness (mild suprapubic). There is no rebound and no guarding.  Genitourinary: Vagina normal. There is no rash, tenderness or lesion on the right labia. There is no rash, tenderness or lesion on the left labia. No signs of injury (with good healing of pressure ulcerations) around the vagina.  Surgically absent cervix and uterus.  #3 incontinence ring with support was placed without difficult.  Pt was able to valsalva and push the pessary almost out.  Therefore, the #4 incontinence ring with support was replaced.  Pt tolerated procedure well.  Lymphadenopathy:       Right: No inguinal adenopathy present.       Left: No inguinal adenopathy present.  Skin: Skin is warm and dry.  Psychiatric: She has a normal mood and affect.  Flank:  No CVA tenderness     Assessment:     Cystocele UTI  H/O MRSA UTI in past    Plan:     Urine culture Doxycycline 100mg  bid x 7 days #4 pessary replaced.  Recheck 3-4 months

## 2014-09-21 ENCOUNTER — Encounter: Payer: Self-pay | Admitting: Obstetrics & Gynecology

## 2014-09-22 LAB — URINE CULTURE
Colony Count: NO GROWTH
Organism ID, Bacteria: NO GROWTH

## 2014-09-23 ENCOUNTER — Telehealth: Payer: Self-pay | Admitting: Obstetrics & Gynecology

## 2014-09-23 NOTE — Telephone Encounter (Signed)
Patient is calling for recent urine culture results. Last seen 09/20/14.

## 2014-09-23 NOTE — Telephone Encounter (Signed)
Spoke with patient. Advised of results as seen below. Patient is agreeable. Patient states "I am just tired all the time but she knows that." States that she no longer has urinary symptoms. Advised if symptoms return to return call to office. Patient is agreeable.  Notes Recorded by Megan Salon, MD on 09/22/2014 at 4:54 PM Please inform pt urine culture was negative. OK to finish abx. How is she feeling?  Routing to provider for final review. Patient agreeable to disposition. Will close encounter.

## 2014-09-27 DIAGNOSIS — E039 Hypothyroidism, unspecified: Secondary | ICD-10-CM | POA: Diagnosis not present

## 2014-09-27 DIAGNOSIS — Z23 Encounter for immunization: Secondary | ICD-10-CM | POA: Diagnosis not present

## 2014-09-27 DIAGNOSIS — R918 Other nonspecific abnormal finding of lung field: Secondary | ICD-10-CM | POA: Diagnosis not present

## 2014-09-27 DIAGNOSIS — F329 Major depressive disorder, single episode, unspecified: Secondary | ICD-10-CM | POA: Diagnosis not present

## 2014-10-06 ENCOUNTER — Ambulatory Visit (INDEPENDENT_AMBULATORY_CARE_PROVIDER_SITE_OTHER): Payer: Medicare Other | Admitting: Licensed Clinical Social Worker

## 2014-10-06 DIAGNOSIS — F3341 Major depressive disorder, recurrent, in partial remission: Secondary | ICD-10-CM

## 2014-10-20 ENCOUNTER — Ambulatory Visit (INDEPENDENT_AMBULATORY_CARE_PROVIDER_SITE_OTHER): Payer: Medicare Other | Admitting: Licensed Clinical Social Worker

## 2014-10-20 DIAGNOSIS — F3341 Major depressive disorder, recurrent, in partial remission: Secondary | ICD-10-CM

## 2014-10-28 DIAGNOSIS — M25511 Pain in right shoulder: Secondary | ICD-10-CM | POA: Diagnosis not present

## 2014-11-01 ENCOUNTER — Telehealth: Payer: Self-pay | Admitting: Obstetrics & Gynecology

## 2014-11-01 NOTE — Telephone Encounter (Signed)
Spoke with patient. Patient states that she noticed a "blister" on her arm over the weekend. Went to the drug store and spoke with the pharmacist about receiving the shingles vaccine. Was advised she received this last year. Now "shingles" has spread from her wrist to her shoulder of her left arm. Patient has an appointment to see a new PCP but could not get in for "a while." Unable to tell me the date of the appointment. Patient states she had shingles last year also. Requesting a medication for pain from [facility] until she can be seen with her PCP. Advised I will speak with Dr.Miller regarding request and return call.

## 2014-11-01 NOTE — Telephone Encounter (Signed)
Patient called and said, "I have shingles but haven't seen my new doctor yet. I am hoping Dr. Sabra Heck will be willing to call in some pain medicine for me?" Pharmacy on file is correct.

## 2014-11-01 NOTE — Telephone Encounter (Signed)
Left message to call Kaitlyn at [phone]. 

## 2014-11-02 ENCOUNTER — Telehealth: Payer: Self-pay | Admitting: Obstetrics & Gynecology

## 2014-11-02 NOTE — Telephone Encounter (Signed)
She needs to go to PCP or urgent care, today if possible.

## 2014-11-02 NOTE — Telephone Encounter (Signed)
Thank you for giving her the message.  This really should be handled with her PCP office or urgent care.  Thanks.

## 2014-11-02 NOTE — Telephone Encounter (Signed)
Spoke with patient. Advised of message as seen below from [place]. Patient states "Let me explain something to you. I was seen by the pharmacist who told me it was shingles. I have had this before." Advised patient if this is a shingles recurrence it is important that she be seen with her PCP or local urgent care for treatment. "I do not have a primary. Dr.Pang left and I do not see the new doctor for months." Advised since she is an established patient with their practice if she calls and explains symptoms will be able to be seen before that appointment. Offered to help schedule. Patient declines to schedule. Advised may also be seen at a local urgent care as they are able to see patients at any time as long as they are open. [place] Again recommended she be seen with primary care office or urgent care to ensure proper treatment. "You know what just forget it. Thanks for the call." Patient discontinued phone call.  Routing to provider for final review. Patient agreeable to disposition. Will close encounter.   Patient aware provider will review message and nurse will return call if any additional advice or change of disposition.

## 2014-11-02 NOTE — Telephone Encounter (Signed)
Patient says she is returning a call to Kait[place]. °

## 2014-11-02 NOTE — Telephone Encounter (Signed)
Spoke with patient. Patient calling regarding requested pain medication for shingles outbreak. Advised Dr.Miller was out of the office yesterday due to surgery cases. Advised I will speak with her this morning when she returns regarding recommendations and will return call. Advised patient if symptoms are worsening may be seen at local urgent care or may want to speak with PCP as having shingles will need further medical evaluation for medication. Patient would like to wait to hear from [facility].

## 2014-11-02 NOTE — Telephone Encounter (Signed)
Return call to patient. Patient calling back to let [facility] and Dr Sabra Heck know that PCP agreed to see her tomorrow morning.   Routing to provider for final review. Will close encounter.

## 2014-11-02 NOTE — Telephone Encounter (Signed)
Patient is asking to talk with [facility] regarding previous conversation.

## 2014-11-03 DIAGNOSIS — F17200 Nicotine dependence, unspecified, uncomplicated: Secondary | ICD-10-CM | POA: Diagnosis not present

## 2014-11-03 DIAGNOSIS — B029 Zoster without complications: Secondary | ICD-10-CM | POA: Diagnosis not present

## 2014-11-10 ENCOUNTER — Ambulatory Visit (INDEPENDENT_AMBULATORY_CARE_PROVIDER_SITE_OTHER): Payer: Medicare Other | Admitting: Licensed Clinical Social Worker

## 2014-11-10 DIAGNOSIS — F3341 Major depressive disorder, recurrent, in partial remission: Secondary | ICD-10-CM

## 2014-11-11 ENCOUNTER — Telehealth: Payer: Self-pay | Admitting: Obstetrics & Gynecology

## 2014-11-11 NOTE — Telephone Encounter (Signed)
Patient needs an appointment with Dr. Sabra Heck for a pessary check.

## 2014-11-11 NOTE — Telephone Encounter (Signed)
Call to patient. She denies complaints at this time. She states she needs follow up with Dr. Sabra Heck for pessary check. Appointment made for 11/25/14, patient request for date due to driver availability.   Patient advised to return all if any concerns prior to appointment and patient agreeable. Routing to provider for final review. Patient agreeable to disposition. Will close encounter.

## 2014-11-24 ENCOUNTER — Telehealth: Payer: Self-pay | Admitting: Obstetrics & Gynecology

## 2014-11-24 NOTE — Telephone Encounter (Signed)
Call back to patient. Offered appointment for Friday with Dr Sabra Heck. 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 She states she has left over cream from previous infection that she always gets when it is time for pessary to be changed. Again offered to see patient on Friday or Monday. Patient states she will just wait until already rescheduled appointment on Tuesday morning at 0945. Advised to call back if desires to be seen sooner.  Routing to provider for final review. Patient agreeable to disposition. Will close encounter.

## 2014-11-24 NOTE — Telephone Encounter (Signed)
Called and spoke with patient to reschedule her pessary check for 11/25/14 due Dr. Sabra Heck will be out of the office. Patient emphatically declined to see any other provider so she is rescheduled to 11/30/14 with Dr. Sabra Heck. The patient then said, "I have an infection around my pessary so I need to be seen." The patient again declined to see any other provider sooner.

## 2014-11-25 ENCOUNTER — Ambulatory Visit: Payer: Medicare Other | Admitting: Obstetrics & Gynecology

## 2014-11-30 ENCOUNTER — Ambulatory Visit (INDEPENDENT_AMBULATORY_CARE_PROVIDER_SITE_OTHER): Payer: Medicare Other | Admitting: Obstetrics & Gynecology

## 2014-11-30 ENCOUNTER — Encounter: Payer: Self-pay | Admitting: Obstetrics & Gynecology

## 2014-11-30 VITALS — BP 100/60 | HR 60 | Resp 16 | Wt 134.0 lb

## 2014-11-30 DIAGNOSIS — IMO0002 Reserved for concepts with insufficient information to code with codable children: Secondary | ICD-10-CM

## 2014-11-30 DIAGNOSIS — N8110 Cystocele, unspecified: Secondary | ICD-10-CM

## 2014-11-30 DIAGNOSIS — N898 Other specified noninflammatory disorders of vagina: Secondary | ICD-10-CM

## 2014-11-30 NOTE — Progress Notes (Signed)
74 y.o. Married White female G0P0000 here for pessary check.  She is having some increased discharge.  She also reports some RLQ discomfort.  This is common when she has a vaginal ulceration/pressure area due to the pessary.  Pt reports she is under so much stress with her 67+ year old mother.  She and sister are running out of money to pay for help during the day with mother.  Pt is working on a loan on equity in mother's home but this hasn't come through yet.  Pt does have an app with Marya Amsler tomorrow.  Pt reports her mother is now down to only about 40 pounds.    ROS: no fevers, no urinary incontinence/urgency/frequency, no nausea/vomiting/diarrhea  Exam:   BP 100/60 mmHg  Pulse 60  Resp 16  Wt 134 lb (60.782 kg) General appearance: alert and cooperative  Pelvic: External genitalia:  no lesions              Urethra: normal appearing urethra with no masses, tenderness or lesions              Bartholins and Skenes: Bartholin's, Urethra, Skene's normal                 Vagina: normal appearing vagina with normal color, yellowish dishcarge noted, #3 incontinence ring with support removed.  Ulceration at apex and just inferiorly noted.  Pessary was cleansed but left out.              Cervix: absent Bimanual Exam:  Uterus:  absent                               Adnexa:    no masses                             A: Cystocele- asymptomatic when pessary is in place Vaginal discharge Vaginal ulceration from pressure from pessary  P:  Pessary left out and discharge will be treated with Metrogel 0.75% ointment qhs x 5 nights. Pt will return in 2-3 weeks for replacement.  An After Visit Summary was printed and given to the patient.

## 2014-12-01 ENCOUNTER — Ambulatory Visit (INDEPENDENT_AMBULATORY_CARE_PROVIDER_SITE_OTHER): Payer: Medicare Other | Admitting: Licensed Clinical Social Worker

## 2014-12-01 DIAGNOSIS — F3341 Major depressive disorder, recurrent, in partial remission: Secondary | ICD-10-CM

## 2014-12-02 ENCOUNTER — Other Ambulatory Visit: Payer: Self-pay | Admitting: Obstetrics & Gynecology

## 2014-12-02 NOTE — Telephone Encounter (Signed)
Medication refill request: Bactroban Ointment Last OV: 11/30/14 with SM  Next AEX: No AEX scheduled  Last MMG (if hormonal medication request): n/a Refill authorized: ?  Patient was seen for OV 11/30/14 with Dr. Sabra Heck not showing nothing was sent/ called in.

## 2014-12-02 NOTE — Telephone Encounter (Signed)
[facility] calling states patient there to pick up bactraban ointment. Patient states for mrsa and she puts it in her nose.   [facility]: [phone] Fax [phone]

## 2014-12-03 MED ORDER — MUPIROCIN 2 % EX OINT: 1.0000 "application " | TOPICAL_OINTMENT | Freq: Two times a day (BID) | CUTANEOUS | Status: DC

## 2014-12-03 NOTE — Telephone Encounter (Signed)
Please send this through to Dr. Sabra Heck for review and Rx.

## 2014-12-03 NOTE — Telephone Encounter (Signed)
Left Message To Call Back  

## 2014-12-03 NOTE — Telephone Encounter (Signed)
Patient notified that rx has been sent to [place].

## 2014-12-03 NOTE — Telephone Encounter (Signed)
Routed to Dr. Sabra Heck, please advise.

## 2014-12-09 ENCOUNTER — Ambulatory Visit (INDEPENDENT_AMBULATORY_CARE_PROVIDER_SITE_OTHER): Payer: Medicare Other | Admitting: Obstetrics & Gynecology

## 2014-12-09 ENCOUNTER — Encounter: Payer: Self-pay | Admitting: Obstetrics & Gynecology

## 2014-12-09 VITALS — BP 100/62 | HR 64 | Resp 16 | Wt 132.0 lb

## 2014-12-09 DIAGNOSIS — N8110 Cystocele, unspecified: Secondary | ICD-10-CM | POA: Diagnosis not present

## 2014-12-09 DIAGNOSIS — IMO0002 Reserved for concepts with insufficient information to code with codable children: Secondary | ICD-10-CM

## 2014-12-09 DIAGNOSIS — N3941 Urge incontinence: Secondary | ICD-10-CM

## 2014-12-09 NOTE — Progress Notes (Signed)
74 y.o. Married White female G0P0000 here for pessary placement and recheck of vaginal discharge/pressure lesion in vaginal from pessary.  Pt reports discharge and odor are much better.  Pt is having more incontinence due to the pessary not being in the vagina.    Saw Marya Amsler last week.  She is taking 30mg  of Lexapro.  Has follow up 12/22/14.  Pt is considering going up on the dosage to 40mg .  rx has been written that way.  Pt is feeling supported with stressors doing to mother's care.  Reports spouse is having erectile dysfunction.  He doesn't want to see a urologist.  Pt not asking for any recommendations but just wanting to have someone else know that at it makes it easier on her.    ROS: decreased vaginal discharge, minimal RLQ pain, increased incontinence with pessary out, no back pain, no change in bowel habits, increased urinary urgency  Exam:   BP 100/62 mmHg  Pulse 64  Resp 16  Wt 59.875 kg (132 lb)   General appearance: alert, cooperative and no distress Abd:  Soft, ND, ND, + BS  Pelvic: External genitalia:  Normal appearance              Urethra: normal appearing urethra with no masses, tenderness or lesions              Bartholins and Skenes: Bartholin's, Urethra, Skene's normal                 Vagina: pressure lesion on posterior and slightly inferior aspect of vagina is small.  It is still present, however.              Cervix: absent Bimanual Exam:  Uterus:  absent                               Adnexa:    no masses                Pessary was replaced without difficulty.  Due to pt's current stressors and significant incontinence, will replace.  Pt to call with any change in bleeding/discharge.  A: Cystocele, symptomatic     Depression Stressors with mother's aging/illness Incontinence  P:  Return to office in 3 months for recheck.       Pt will start taking Lexapro 40mg  and has follow up with Almyra Free whitt in two weeks    An After Visit Summary was printed and given  to the patient.

## 2014-12-12 ENCOUNTER — Encounter: Payer: Self-pay | Admitting: Obstetrics & Gynecology

## 2014-12-22 ENCOUNTER — Ambulatory Visit (INDEPENDENT_AMBULATORY_CARE_PROVIDER_SITE_OTHER): Payer: Medicare Other | Admitting: Licensed Clinical Social Worker

## 2014-12-22 DIAGNOSIS — F3341 Major depressive disorder, recurrent, in partial remission: Secondary | ICD-10-CM

## 2015-01-12 ENCOUNTER — Ambulatory Visit (INDEPENDENT_AMBULATORY_CARE_PROVIDER_SITE_OTHER): Payer: Medicare Other | Admitting: Licensed Clinical Social Worker

## 2015-01-12 DIAGNOSIS — F3341 Major depressive disorder, recurrent, in partial remission: Secondary | ICD-10-CM

## 2015-01-19 DIAGNOSIS — Z23 Encounter for immunization: Secondary | ICD-10-CM | POA: Diagnosis not present

## 2015-01-26 ENCOUNTER — Ambulatory Visit (INDEPENDENT_AMBULATORY_CARE_PROVIDER_SITE_OTHER): Payer: Medicare Other | Admitting: Licensed Clinical Social Worker

## 2015-01-26 DIAGNOSIS — F3341 Major depressive disorder, recurrent, in partial remission: Secondary | ICD-10-CM | POA: Diagnosis not present

## 2015-02-09 ENCOUNTER — Ambulatory Visit (INDEPENDENT_AMBULATORY_CARE_PROVIDER_SITE_OTHER): Payer: Medicare Other | Admitting: Licensed Clinical Social Worker

## 2015-02-09 DIAGNOSIS — F3341 Major depressive disorder, recurrent, in partial remission: Secondary | ICD-10-CM | POA: Diagnosis not present

## 2015-02-23 ENCOUNTER — Ambulatory Visit (INDEPENDENT_AMBULATORY_CARE_PROVIDER_SITE_OTHER): Payer: Medicare Other | Admitting: Licensed Clinical Social Worker

## 2015-02-23 DIAGNOSIS — F3341 Major depressive disorder, recurrent, in partial remission: Secondary | ICD-10-CM | POA: Diagnosis not present

## 2015-03-07 ENCOUNTER — Telehealth: Payer: Self-pay | Admitting: Obstetrics & Gynecology

## 2015-03-07 NOTE — Telephone Encounter (Signed)
Ok to put into the schedule on the day she needs.  Could she come at 12:30?  That would work.

## 2015-03-07 NOTE — Telephone Encounter (Signed)
Spoke with patient. Patient states that she recently had to get a job to help with her mothers medical expenses because she is bed ridden. Patient is working Thusday through Sunday then cares for the mother Monday through Wednesday. Patient Has an appointment scheduled for 11/2 with another MD. Does not want to have to have someone else watch her mother 2 days in a row. Patient is requesting to have a late afternoon appointment on 11/10. Can be at the office by 4 pm. "I work until 3, but it takes me one hour to get to the office. This way I could not have to have someone else watch my mom." Advised patient Dr.Miller has ultrasounds on Thursday afternoons and 4 o clock is currently full. Offered for patient to see another provider, but patient declines. Patient is requesting that I speak with Dr.Miller to see if there is any way she would be seen late afternoon on 11/10. Advised I will speak with Dr.Miller and return call. Patient is agreeable.

## 2015-03-07 NOTE — Telephone Encounter (Signed)
Spoke with patient. Advised I have spoken with Dr.Miller who can see her 11/10 at 3:45 pm. Patient is agreeable to date and time.  Routing to provider for final review. Patient agreeable to disposition. Will close encounter.

## 2015-03-07 NOTE — Telephone Encounter (Signed)
Patient has an appointment with Dr Sabra Heck for Thursday that she may need to cancel. Would like to see her next Thursday the 10th if possible.

## 2015-03-09 ENCOUNTER — Ambulatory Visit (INDEPENDENT_AMBULATORY_CARE_PROVIDER_SITE_OTHER): Payer: Medicare Other | Admitting: Licensed Clinical Social Worker

## 2015-03-09 DIAGNOSIS — F3341 Major depressive disorder, recurrent, in partial remission: Secondary | ICD-10-CM | POA: Diagnosis not present

## 2015-03-10 ENCOUNTER — Ambulatory Visit: Payer: Medicare Other | Admitting: Obstetrics & Gynecology

## 2015-03-17 ENCOUNTER — Ambulatory Visit (INDEPENDENT_AMBULATORY_CARE_PROVIDER_SITE_OTHER): Payer: Medicare Other | Admitting: Obstetrics & Gynecology

## 2015-03-17 VITALS — BP 122/82 | HR 60 | Temp 98.0°F | Resp 12 | Wt 136.0 lb

## 2015-03-17 DIAGNOSIS — N8110 Cystocele, unspecified: Secondary | ICD-10-CM

## 2015-03-17 DIAGNOSIS — N390 Urinary tract infection, site not specified: Secondary | ICD-10-CM

## 2015-03-17 DIAGNOSIS — N3000 Acute cystitis without hematuria: Secondary | ICD-10-CM | POA: Diagnosis not present

## 2015-03-17 DIAGNOSIS — N898 Other specified noninflammatory disorders of vagina: Secondary | ICD-10-CM

## 2015-03-17 DIAGNOSIS — IMO0002 Reserved for concepts with insufficient information to code with codable children: Secondary | ICD-10-CM

## 2015-03-17 LAB — POCT URINALYSIS DIPSTICK
Bilirubin, UA: NEGATIVE
Glucose, UA: NEGATIVE
Ketones, UA: NEGATIVE
Nitrite, UA: POSITIVE
UROBILINOGEN UA: NEGATIVE
pH, UA: 5

## 2015-03-17 MED ORDER — METRONIDAZOLE 0.75 % VA GEL: 1.0000 | Freq: Every day | VAGINAL | Status: DC

## 2015-03-17 MED ORDER — CIPROFLOXACIN HCL 500 MG PO TABS: 500.0000 mg | ORAL_TABLET | Freq: Two times a day (BID) | ORAL | Status: DC

## 2015-03-17 NOTE — Progress Notes (Deleted)
Subjective:     Patient ID: Victoria Sims, female   DOB: Mar 17, 1941, 74 y.o.   MRN: [id number]  HPI   Review of Systems     Objective:   Physical Exam     Assessment:     ***    Plan:     Metrogel Ciprofloxin bid x 7 day Urine culture Recheck six weeks

## 2015-03-19 ENCOUNTER — Encounter: Payer: Self-pay | Admitting: Obstetrics & Gynecology

## 2015-03-19 LAB — URINE CULTURE

## 2015-03-19 NOTE — Progress Notes (Signed)
Patient ID: Victoria Sims, female   DOB: 1941-03-02, 74 y.o.   MRN: [id number]  74 y.o. Married White female G0P0000 here for pessary check.  Pt reports she thinks she has a UTI due to increased urinary frequency.  She is having some lower pelvic pain as well.  Denies back pain or fevers.  She is also having vaginal discharge which is a recurrent problem with her and her pessary use.  Pt is having additional personal stressors as she is giving care to her aged/frail mother who now weight less than 60 pounds.  Mother is out of money except for her home.  Sister won't agree to take a loan out against the house.  Pt and sister cannot afford around the clock care.   Also, pt reports husband is having trouble with erections, ejaculation and urination.  Won't see a urologist.  Highly advsied pt to encourage him to go as he probably has prostate issues which are very treatable, but it could be prostate cancer.  Voices understanding.    ROS: no vaginal bleeding, increased urinary pressure and dysuria, no hematuria.  Exam:   BP 122/82 mmHg  Pulse 60  Temp(Src) 98 F (36.7 C) (Oral)  Resp 12  Wt 136 lb (61.689 kg)   General appearance: alert, cooperative and no distress Inguinal adenopathy: neg   Pelvic: External genitalia:  no lesions              Urethra: normal appearing urethra with no masses, tenderness or lesions              Bartholins and Skenes: Bartholin's, Urethra, Skene's normal               Vagina: Pessary removed.  Significant ulceration at vaginal apex present.  Pessary cleansed and will be autoclaved.  Pt cannot have it replaced right now as vagina needs to heal.              Cervix: removed surgically Bimanual Exam:  Uterus:  Surgically absent                               Adnexa:    no masses                               Anus:  defer exam  A: Cystocele Vaginal ulceration from pessary Probable UTI  P:  Pt started on Ciprofloxin 500mg  bid x 7 days. Urine culture pending.   Pt knows to call if symptoms worsen. Metrogel 0.75% gel qhs x 5 nights.  RF #1 also given. Recheck 1 month.  Hopefully vagina will be fully healed at that time.  An After Visit Summary was printed and given to the patient.

## 2015-03-21 MED ORDER — DOXYCYCLINE HYCLATE 100 MG PO CAPS: 100.0000 mg | ORAL_CAPSULE | Freq: Two times a day (BID) | ORAL | Status: DC

## 2015-03-21 NOTE — Addendum Note (Signed)
Addended by: Megan Salon on: 03/21/2015 04:50 PM   Modules accepted: Orders

## 2015-03-22 ENCOUNTER — Telehealth: Payer: Self-pay

## 2015-03-22 NOTE — Telephone Encounter (Signed)
Spoke with patient-states is starting to feel much better. She is still hurting and had the bleeding for one day, but felt that was from pessary being removed. She will cancel the urine recheck for Weds and call if she needs anything.//kn

## 2015-03-23 ENCOUNTER — Ambulatory Visit (INDEPENDENT_AMBULATORY_CARE_PROVIDER_SITE_OTHER): Payer: Medicare Other | Admitting: Licensed Clinical Social Worker

## 2015-03-23 ENCOUNTER — Ambulatory Visit: Payer: Medicare Other

## 2015-03-23 DIAGNOSIS — I5032 Chronic diastolic (congestive) heart failure: Secondary | ICD-10-CM | POA: Diagnosis not present

## 2015-03-23 DIAGNOSIS — F3341 Major depressive disorder, recurrent, in partial remission: Secondary | ICD-10-CM | POA: Diagnosis not present

## 2015-03-23 DIAGNOSIS — E039 Hypothyroidism, unspecified: Secondary | ICD-10-CM | POA: Diagnosis not present

## 2015-03-23 DIAGNOSIS — M8580 Other specified disorders of bone density and structure, unspecified site: Secondary | ICD-10-CM | POA: Diagnosis not present

## 2015-03-23 DIAGNOSIS — E559 Vitamin D deficiency, unspecified: Secondary | ICD-10-CM | POA: Diagnosis not present

## 2015-03-23 DIAGNOSIS — E785 Hyperlipidemia, unspecified: Secondary | ICD-10-CM | POA: Diagnosis not present

## 2015-03-23 DIAGNOSIS — N183 Chronic kidney disease, stage 3 (moderate): Secondary | ICD-10-CM | POA: Diagnosis not present

## 2015-04-06 DIAGNOSIS — J449 Chronic obstructive pulmonary disease, unspecified: Secondary | ICD-10-CM | POA: Diagnosis not present

## 2015-04-06 DIAGNOSIS — F339 Major depressive disorder, recurrent, unspecified: Secondary | ICD-10-CM | POA: Diagnosis not present

## 2015-04-06 DIAGNOSIS — D709 Neutropenia, unspecified: Secondary | ICD-10-CM | POA: Diagnosis not present

## 2015-04-06 DIAGNOSIS — I509 Heart failure, unspecified: Secondary | ICD-10-CM | POA: Diagnosis not present

## 2015-04-06 DIAGNOSIS — E785 Hyperlipidemia, unspecified: Secondary | ICD-10-CM | POA: Diagnosis not present

## 2015-04-06 DIAGNOSIS — F17200 Nicotine dependence, unspecified, uncomplicated: Secondary | ICD-10-CM | POA: Diagnosis not present

## 2015-04-13 ENCOUNTER — Ambulatory Visit (INDEPENDENT_AMBULATORY_CARE_PROVIDER_SITE_OTHER): Payer: Medicare Other | Admitting: Licensed Clinical Social Worker

## 2015-04-13 DIAGNOSIS — F3341 Major depressive disorder, recurrent, in partial remission: Secondary | ICD-10-CM | POA: Diagnosis not present

## 2015-04-27 ENCOUNTER — Ambulatory Visit (INDEPENDENT_AMBULATORY_CARE_PROVIDER_SITE_OTHER): Payer: Medicare Other | Admitting: Licensed Clinical Social Worker

## 2015-04-27 DIAGNOSIS — F3341 Major depressive disorder, recurrent, in partial remission: Secondary | ICD-10-CM | POA: Diagnosis not present

## 2015-04-28 ENCOUNTER — Ambulatory Visit: Payer: Medicare Other | Admitting: Obstetrics & Gynecology

## 2015-05-05 ENCOUNTER — Ambulatory Visit: Payer: Medicare Other | Admitting: Obstetrics & Gynecology

## 2015-05-11 ENCOUNTER — Ambulatory Visit (INDEPENDENT_AMBULATORY_CARE_PROVIDER_SITE_OTHER): Payer: Medicare Other | Admitting: Licensed Clinical Social Worker

## 2015-05-11 DIAGNOSIS — L280 Lichen simplex chronicus: Secondary | ICD-10-CM | POA: Diagnosis not present

## 2015-05-11 DIAGNOSIS — F3341 Major depressive disorder, recurrent, in partial remission: Secondary | ICD-10-CM | POA: Diagnosis not present

## 2015-05-11 DIAGNOSIS — L570 Actinic keratosis: Secondary | ICD-10-CM | POA: Diagnosis not present

## 2015-05-12 ENCOUNTER — Ambulatory Visit: Payer: Medicare Other | Admitting: Obstetrics & Gynecology

## 2015-05-19 ENCOUNTER — Encounter: Payer: Self-pay | Admitting: Obstetrics & Gynecology

## 2015-05-19 ENCOUNTER — Ambulatory Visit (INDEPENDENT_AMBULATORY_CARE_PROVIDER_SITE_OTHER): Payer: Medicare Other | Admitting: Obstetrics & Gynecology

## 2015-05-19 VITALS — BP 120/64 | HR 68 | Temp 97.8°F | Ht 61.75 in | Wt 135.0 lb

## 2015-05-19 DIAGNOSIS — IMO0002 Reserved for concepts with insufficient information to code with codable children: Secondary | ICD-10-CM

## 2015-05-19 DIAGNOSIS — N390 Urinary tract infection, site not specified: Secondary | ICD-10-CM | POA: Diagnosis not present

## 2015-05-19 DIAGNOSIS — R300 Dysuria: Secondary | ICD-10-CM | POA: Diagnosis not present

## 2015-05-19 DIAGNOSIS — N8110 Cystocele, unspecified: Secondary | ICD-10-CM | POA: Diagnosis not present

## 2015-05-19 LAB — POCT URINALYSIS DIPSTICK
Bilirubin, UA: NEGATIVE
Glucose, UA: NEGATIVE
KETONES UA: NEGATIVE
NITRITE UA: NEGATIVE
Protein, UA: NEGATIVE
Urobilinogen, UA: NEGATIVE
pH, UA: 5

## 2015-05-19 MED ORDER — HYDROCODONE-ACETAMINOPHEN 5-325 MG PO TABS: 1.0000 | ORAL_TABLET | Freq: Four times a day (QID) | ORAL | Status: DC | PRN

## 2015-05-19 MED ORDER — DOXYCYCLINE HYCLATE 100 MG PO CAPS: 100.0000 mg | ORAL_CAPSULE | Freq: Two times a day (BID) | ORAL | Status: DC

## 2015-05-19 NOTE — Progress Notes (Signed)
75 y.o. Married White female G0P0000 here for hopeful pessary replacement.  She reports her vaginal bleeding and discharge have resolved.  However, she is having increased urination symptoms and increased low back pain.  This is how she feels with the past several UTI's she's had.  She denies fever or dysuria.  Also denies blood in urine.  Is feeling bladder prolapse more as well.  She is sexually active but this is limited due to the extensive care she is providing her mother.  D/W pt involving hospice at this point as her mother weighs less than 40 pounds.  Pt had not considered this but states she will talk with her sister.   Patient has been using following pessary style and size:  #3 incontinence ring with support.  ROS: 12 system ROS questions were negative except for issues stated above in HPI.  Exam:   BP 120/64 mmHg  Pulse 68  Temp(Src) 97.8 F (36.6 C) (Oral)  Ht 5' 1.75" (1.568 m)  Wt 135 lb (61.236 kg)  BMI 24.91 kg/m2  General appearance: alert, appears stated age and no distress  Flank:  No CVA tenderness Abd:  Soft, NT, ND, normal BS, no masses Inguinal lymph nodes:  not enlarged  Pelvic: External genitalia:  no lesions              Urethra: normal appearing urethra with no masses, tenderness or lesions              Bartholins and Skenes: normal                 Vagina: normal appearing vagina with normal color and discharge, no lesions, with well healed pressure ulceration from pessary use              Cervix: absent and removed surgically   Pap obtained:  no Bimanual Exam:  Uterus:  surgically absent, vaginal cuff well healed, no masses or fullnes                               Adnexa:    no masses and surgically absent bilateral                               Anus:  defer exam  Pessary was replaced with out difficulty.  Pt felt comfortable with pessary.  Did not want to wait and try to void before leaving.  A: UTI Cystocele- asymptomatic H/O recurrent UTI H/O MRSA  UTI (last culture 11/16 and only resistance was ampillicin but cipro did not help symptoms)  P:  Return to office in 3-4 months for recheck. Urine micro and culture pending Doxycycline 100mg  bid x 7 days

## 2015-05-20 LAB — URINE CULTURE
Colony Count: NO GROWTH
ORGANISM ID, BACTERIA: NO GROWTH

## 2015-05-20 LAB — URINALYSIS, MICROSCOPIC ONLY
Bacteria, UA: NONE SEEN [HPF]
CASTS: NONE SEEN [LPF]
CRYSTALS: NONE SEEN [HPF]
RBC / HPF: NONE SEEN RBC/HPF (ref <=2)
Squamous Epithelial / LPF: NONE SEEN [HPF] (ref <=5)
WBC, UA: NONE SEEN WBC/HPF (ref <=5)
YEAST: NONE SEEN [HPF]

## 2015-05-25 ENCOUNTER — Telehealth: Payer: Self-pay | Admitting: Obstetrics & Gynecology

## 2015-05-25 ENCOUNTER — Ambulatory Visit (INDEPENDENT_AMBULATORY_CARE_PROVIDER_SITE_OTHER): Payer: Medicare Other | Admitting: Licensed Clinical Social Worker

## 2015-05-25 DIAGNOSIS — F3341 Major depressive disorder, recurrent, in partial remission: Secondary | ICD-10-CM

## 2015-05-25 NOTE — Telephone Encounter (Signed)
Spoke with patient. Advised I see that the rx for Hydrocodone 5-325 mg was printed. I will need to check with Dr.Miller tomorrow as she is out of the office today to check on the status of getting this to her pharmacy in [place]. Patient is agreeable.

## 2015-05-25 NOTE — Telephone Encounter (Signed)
Patient says Dr. Sabra Heck was going to call in a prescription for hydrocodone and it is not at the pharmacy.

## 2015-05-27 NOTE — Telephone Encounter (Addendum)
Patient is returning a call to [place]. Patient is aware the prescription cannot be mailed. She lives in [place] and cannot get back to [place] until February.

## 2015-05-27 NOTE — Telephone Encounter (Signed)
Left message to call Victoria Sims at [phone]. 

## 2015-05-27 NOTE — Telephone Encounter (Signed)
Please let know I did print it the day she was here but did not give her the prescription before she left.  I cannot mail it.  I will be at the front desk in an envelope.  Anybody can pick it up as long as she calls and lets [place] know who will pick it up and that person needs to bring a picture ID.

## 2015-05-30 NOTE — Telephone Encounter (Signed)
Spoke with patient. Patient states that she is unable to come back to [facility] until 06/08/2015. Will pick up Rx at that time from the office. Does not have anyone close by that can pick up the rx before then.  Dr.Miller, EPIC shows this rx was already printed on 05/18/2015. Is this rx still in the back or do I need to print a new rx?

## 2015-05-30 NOTE — Telephone Encounter (Signed)
That is fine to reprint and I will sign.  Other rx was put in shred bin.

## 2015-05-31 MED ORDER — HYDROCODONE-ACETAMINOPHEN 5-325 MG PO TABS: 1.0000 | ORAL_TABLET | Freq: Four times a day (QID) | ORAL | Status: DC | PRN

## 2015-05-31 NOTE — Telephone Encounter (Signed)
Signed rx placed in a sealed envelop with patient's name and DOB for patient pick up.  Routing to provider for final review. Patient agreeable to disposition. Will close encounter.

## 2015-06-01 DIAGNOSIS — M25511 Pain in right shoulder: Secondary | ICD-10-CM | POA: Diagnosis not present

## 2015-06-08 ENCOUNTER — Ambulatory Visit (INDEPENDENT_AMBULATORY_CARE_PROVIDER_SITE_OTHER): Payer: Medicare Other | Admitting: Licensed Clinical Social Worker

## 2015-06-08 DIAGNOSIS — F3341 Major depressive disorder, recurrent, in partial remission: Secondary | ICD-10-CM

## 2015-06-22 ENCOUNTER — Ambulatory Visit (INDEPENDENT_AMBULATORY_CARE_PROVIDER_SITE_OTHER): Payer: Medicare Other | Admitting: Licensed Clinical Social Worker

## 2015-06-22 DIAGNOSIS — F3341 Major depressive disorder, recurrent, in partial remission: Secondary | ICD-10-CM | POA: Diagnosis not present

## 2015-06-29 ENCOUNTER — Other Ambulatory Visit: Payer: Self-pay

## 2015-06-29 DIAGNOSIS — Z1231 Encounter for screening mammogram for malignant neoplasm of breast: Secondary | ICD-10-CM

## 2015-07-06 ENCOUNTER — Ambulatory Visit (INDEPENDENT_AMBULATORY_CARE_PROVIDER_SITE_OTHER): Payer: Medicare Other | Admitting: Licensed Clinical Social Worker

## 2015-07-06 DIAGNOSIS — F3341 Major depressive disorder, recurrent, in partial remission: Secondary | ICD-10-CM | POA: Diagnosis not present

## 2015-07-26 ENCOUNTER — Other Ambulatory Visit: Payer: Self-pay | Admitting: Acute Care

## 2015-07-26 DIAGNOSIS — F17210 Nicotine dependence, cigarettes, uncomplicated: Principal | ICD-10-CM

## 2015-07-27 ENCOUNTER — Ambulatory Visit (INDEPENDENT_AMBULATORY_CARE_PROVIDER_SITE_OTHER): Payer: Medicare Other | Admitting: Licensed Clinical Social Worker

## 2015-07-27 DIAGNOSIS — F3341 Major depressive disorder, recurrent, in partial remission: Secondary | ICD-10-CM

## 2015-08-10 ENCOUNTER — Ambulatory Visit
Admission: RE | Admit: 2015-08-10 | Discharge: 2015-08-10 | Disposition: A | Discharge status: Home / Self Care | Payer: Medicare Other | Source: Ambulatory Visit

## 2015-08-10 DIAGNOSIS — Z1231 Encounter for screening mammogram for malignant neoplasm of breast: Secondary | ICD-10-CM | POA: Diagnosis not present

## 2015-08-17 ENCOUNTER — Ambulatory Visit (INDEPENDENT_AMBULATORY_CARE_PROVIDER_SITE_OTHER): Payer: Medicare Other | Admitting: Licensed Clinical Social Worker

## 2015-08-17 DIAGNOSIS — F3341 Major depressive disorder, recurrent, in partial remission: Secondary | ICD-10-CM

## 2015-08-18 ENCOUNTER — Ambulatory Visit: Payer: Medicare Other | Admitting: Obstetrics & Gynecology

## 2015-08-24 ENCOUNTER — Ambulatory Visit
Admission: RE | Admit: 2015-08-24 | Discharge: 2015-08-24 | Disposition: A | Discharge status: Home / Self Care | Payer: Medicare Other | Source: Ambulatory Visit | Attending: Acute Care | Admitting: Acute Care

## 2015-08-24 DIAGNOSIS — Z87891 Personal history of nicotine dependence: Secondary | ICD-10-CM | POA: Diagnosis not present

## 2015-08-24 DIAGNOSIS — F17210 Nicotine dependence, cigarettes, uncomplicated: Principal | ICD-10-CM

## 2015-08-31 ENCOUNTER — Ambulatory Visit (INDEPENDENT_AMBULATORY_CARE_PROVIDER_SITE_OTHER): Payer: Medicare Other | Admitting: Licensed Clinical Social Worker

## 2015-08-31 ENCOUNTER — Telehealth: Payer: Self-pay | Admitting: Acute Care

## 2015-08-31 ENCOUNTER — Other Ambulatory Visit: Payer: Self-pay | Admitting: Acute Care

## 2015-08-31 DIAGNOSIS — F3341 Major depressive disorder, recurrent, in partial remission: Secondary | ICD-10-CM | POA: Diagnosis not present

## 2015-08-31 DIAGNOSIS — F17210 Nicotine dependence, cigarettes, uncomplicated: Principal | ICD-10-CM

## 2015-08-31 NOTE — Telephone Encounter (Signed)
I called to give the patient her screening scan results. There was no answer. I left a message requesting the patient call for results with our contact information. We will await her return call, or call again if there is no return call. I will fax the results to her PCP.

## 2015-09-08 ENCOUNTER — Ambulatory Visit: Payer: Medicare Other | Admitting: Obstetrics & Gynecology

## 2015-09-08 ENCOUNTER — Telehealth: Payer: Self-pay | Admitting: Obstetrics & Gynecology

## 2015-09-08 NOTE — Telephone Encounter (Signed)
Left message for pt to call and reschedule pessary check with Dr.Miller due to plumbing issues.

## 2015-09-28 ENCOUNTER — Ambulatory Visit (INDEPENDENT_AMBULATORY_CARE_PROVIDER_SITE_OTHER): Payer: Medicare Other | Admitting: Licensed Clinical Social Worker

## 2015-09-28 DIAGNOSIS — I509 Heart failure, unspecified: Secondary | ICD-10-CM | POA: Diagnosis not present

## 2015-09-28 DIAGNOSIS — F3341 Major depressive disorder, recurrent, in partial remission: Secondary | ICD-10-CM

## 2015-09-28 DIAGNOSIS — E559 Vitamin D deficiency, unspecified: Secondary | ICD-10-CM | POA: Diagnosis not present

## 2015-09-28 DIAGNOSIS — E039 Hypothyroidism, unspecified: Secondary | ICD-10-CM | POA: Diagnosis not present

## 2015-09-28 DIAGNOSIS — E785 Hyperlipidemia, unspecified: Secondary | ICD-10-CM | POA: Diagnosis not present

## 2015-09-28 DIAGNOSIS — M8580 Other specified disorders of bone density and structure, unspecified site: Secondary | ICD-10-CM | POA: Diagnosis not present

## 2015-09-29 ENCOUNTER — Ambulatory Visit (INDEPENDENT_AMBULATORY_CARE_PROVIDER_SITE_OTHER): Payer: Medicare Other | Admitting: Obstetrics & Gynecology

## 2015-09-29 ENCOUNTER — Encounter: Payer: Self-pay | Admitting: Obstetrics & Gynecology

## 2015-09-29 VITALS — BP 104/62 | HR 60 | Temp 98.1°F | Resp 16 | Ht 61.0 in | Wt 139.0 lb

## 2015-09-29 DIAGNOSIS — N3000 Acute cystitis without hematuria: Secondary | ICD-10-CM | POA: Diagnosis not present

## 2015-09-29 DIAGNOSIS — N8110 Cystocele, unspecified: Secondary | ICD-10-CM | POA: Diagnosis not present

## 2015-09-29 DIAGNOSIS — N765 Ulceration of vagina: Secondary | ICD-10-CM | POA: Diagnosis not present

## 2015-09-29 DIAGNOSIS — N898 Other specified noninflammatory disorders of vagina: Secondary | ICD-10-CM | POA: Diagnosis not present

## 2015-09-29 DIAGNOSIS — IMO0002 Reserved for concepts with insufficient information to code with codable children: Secondary | ICD-10-CM

## 2015-09-29 MED ORDER — NITROFURANTOIN MONOHYD MACRO 100 MG PO CAPS: 100.0000 mg | ORAL_CAPSULE | Freq: Two times a day (BID) | ORAL | Status: DC

## 2015-09-29 NOTE — Progress Notes (Signed)
75 y.o. Married White female G0P0000 here for pessary check.  She reports she is having some pelvic pain and some increased vaginal discharge.  Typically, this means she has some vaginal ulceration due to the pessary.  Pt thinks she may have a UTI as well due to the pelvic pain and associated pressure.  Denies vaginal bleeding.  Frustrated with weight.  She is sexually active.  Denies change in urinary frequency.  Denies fever.  Has chronic low back pain but this hasn't changed either.  Patient has been using following pessary style and size:  #3 incontinence ring with support.  Patient Active Problem List   Diagnosis Date Noted  . Cystocele 06/06/2014  . Acid reflux 03/31/2014  . H/O malignant neoplasm of breast 03/31/2014  . H/O infectious disease 03/31/2014  . HLD (hyperlipidemia) 03/31/2014  . OP (osteoporosis) 03/31/2014  . Current tobacco use 03/31/2014  . MRSA (methicillin resistant staph aureus) culture positive 12/04/2013  . History of DVT (deep vein thrombosis) 05/18/2013  . Cataract 01/20/2013  . 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 Urge incontinence 07/11/2011   . Past Medical History  Diagnosis Date  . Depression   . Grave's disease   . Cancer ([place]) 3/07    right breast/ER/PR positive  . Condyloma 01/1998  . Microhematuria 07/1992    negative IVP, [place]  . Cataract     and dry eye/spots on retina  . MRSA (methicillin resistant Staphylococcus aureus)   . Cataract     surgery on right eye 04/12/14  . Macular degeneration of both eyes   . Shingles    Current Outpatient Prescriptions on File Prior to Visit  Medication Sig Dispense Refill  . ALPRAZolam (XANAX) 1 MG tablet Take 1 tablet (1 mg total) by mouth at bedtime as needed for sleep. 30 tablet 1  . Calcium-Vitamin D (CALTRATE 600 PLUS-VIT D PO) Take 1 tablet by mouth daily.    . Cholecalciferol (D3-1000) 1000 UNITS capsule Take 1,000 Units by mouth daily.    Marland Kitchen CLONAZEPAM PO Take by mouth as needed.    Marland Kitchen escitalopram  (LEXAPRO) 20 MG tablet Take 40 mg by mouth daily.    . furosemide (LASIX) 20 MG tablet Take 20 mg by mouth daily.    Marland Kitchen HYDROcodone-acetaminophen (NORCO/VICODIN) 5-325 MG tablet Take 1 tablet by mouth every 6 (six) hours as needed for moderate pain. 30 tablet 0  . levothyroxine (SYNTHROID, LEVOTHROID) 100 MCG tablet Take 100 mcg by mouth daily.    . metroNIDAZOLE (METROGEL) 0.75 % vaginal gel Place 1 Applicatorful vaginally at bedtime. 70 g 1  . pantoprazole (PROTONIX) 40 MG tablet Take 40 mg by mouth daily.    . potassium chloride (K-DUR) 10 MEQ tablet Take 10 mEq by mouth daily. Take 1 tablet on Monday, Tuesday, and Wednesday only. Do not take Thursday, Friday, Saturday, Sunday    . pravastatin (PRAVACHOL) 80 MG tablet Take 80 mg by mouth daily.    Orlie Dakin Sodium ([employer] SENNA-C PLUS PO) Take by mouth.    . Multiple Vitamins-Minerals (PRESERVISION AREDS PO) Take by mouth daily. Reported on 09/29/2015     No current facility-administered medications on file prior to visit.   Review of Systems  Genitourinary: Positive for urgency.  All other systems reviewed and are negative.   Exam:   BP 104/62 mmHg  Pulse 60  Temp(Src) 98.1 F (36.7 C)  Resp 16  Ht 5\' 1"  (1.549 m)  Wt 139 lb (63.05 kg)  BMI 26.28 kg/m2  General appearance: alert and cooperative Inguinal lymph nodes:  not enlarged  Pelvic: External genitalia:  no lesions              Urethra: normal appearing urethra with no masses, tenderness or lesions              Bartholins and Skenes: Bartholin's, Urethra, Skene's normal                 Vagina: pessary removed and posterior vaginal ulceration/erosion noted.  Bleeding noted when touched with scopette              Cervix: removed surgically   Pap obtained:  no Bimanual Exam:  Uterus:  surgically absent, vaginal cuff well healed                               Adnexa:    no masses                               Anus:  normal sphincter tone, no lesions  Pessary was  removed without difficulty.  Pessary was cleansed.  Pessary was not replaced due to ulceration. Patient tolerated procedure well.    A: Cystocele- asymptomatic when pessary is in place Vaginal pressure ulceration Enterocele Possible UTI, h/o MRSA + UTI  P:  Return to office in 3 wks for recheck.  Will leave pessary out during that time. Macrobid 100mg  bid x 7days.  Urine culture pending.   Metrogel 0.75% qhs x 5 days.  Does not need rx.

## 2015-10-01 LAB — URINE CULTURE
Colony Count: NO GROWTH
Organism ID, Bacteria: NO GROWTH

## 2015-10-04 ENCOUNTER — Telehealth: Payer: Self-pay | Admitting: Obstetrics & Gynecology

## 2015-10-04 NOTE — Telephone Encounter (Signed)
Patient calling for urine results.

## 2015-10-04 NOTE — Telephone Encounter (Signed)
It is ok to have her stop her antibiotics.  Urine culture was negative.  Urine dip in office was abnormal due to vaginal discharge from ulceration from her pessary.

## 2015-10-04 NOTE — Telephone Encounter (Signed)
Spoke with patient. Advised of message as seen below from Dr.Miller. She is agreeable and verbalizes understanding.  Routing to provider for final review. Patient agreeable to disposition. Will close encounter.  

## 2015-10-04 NOTE — Telephone Encounter (Signed)
Dr.Miller, patient's urine culture from 09/29/2015 returned negative. Patient was placed on Macrobid 100 mg daily x 7 days at her appointment. Was advised to keep her pessary out until f/u appointment on 10/27/2015. Okay for patient to stop Macrobid at this time? Anything further needed prior to her follow up appointment?

## 2015-10-05 DIAGNOSIS — F339 Major depressive disorder, recurrent, unspecified: Secondary | ICD-10-CM | POA: Diagnosis not present

## 2015-10-05 DIAGNOSIS — M25561 Pain in right knee: Secondary | ICD-10-CM | POA: Diagnosis not present

## 2015-10-05 DIAGNOSIS — J449 Chronic obstructive pulmonary disease, unspecified: Secondary | ICD-10-CM | POA: Diagnosis not present

## 2015-10-05 DIAGNOSIS — M1711 Unilateral primary osteoarthritis, right knee: Secondary | ICD-10-CM | POA: Diagnosis not present

## 2015-10-05 DIAGNOSIS — M25511 Pain in right shoulder: Secondary | ICD-10-CM | POA: Diagnosis not present

## 2015-10-05 DIAGNOSIS — F17200 Nicotine dependence, unspecified, uncomplicated: Secondary | ICD-10-CM | POA: Diagnosis not present

## 2015-10-05 DIAGNOSIS — I700 Atherosclerosis of aorta: Secondary | ICD-10-CM | POA: Diagnosis not present

## 2015-10-05 DIAGNOSIS — D709 Neutropenia, unspecified: Secondary | ICD-10-CM | POA: Diagnosis not present

## 2015-10-06 ENCOUNTER — Other Ambulatory Visit: Payer: Self-pay | Admitting: Internal Medicine

## 2015-10-06 DIAGNOSIS — R600 Localized edema: Secondary | ICD-10-CM

## 2015-10-12 ENCOUNTER — Other Ambulatory Visit: Payer: Self-pay

## 2015-10-12 DIAGNOSIS — I5030 Unspecified diastolic (congestive) heart failure: Secondary | ICD-10-CM | POA: Diagnosis not present

## 2015-10-12 DIAGNOSIS — R600 Localized edema: Secondary | ICD-10-CM | POA: Diagnosis not present

## 2015-10-19 ENCOUNTER — Telehealth: Payer: Self-pay | Admitting: Acute Care

## 2015-10-19 ENCOUNTER — Ambulatory Visit (INDEPENDENT_AMBULATORY_CARE_PROVIDER_SITE_OTHER): Payer: Medicare Other | Admitting: Licensed Clinical Social Worker

## 2015-10-19 DIAGNOSIS — F3341 Major depressive disorder, recurrent, in partial remission: Secondary | ICD-10-CM

## 2015-10-19 NOTE — Telephone Encounter (Signed)
Opened in error

## 2015-10-27 ENCOUNTER — Ambulatory Visit: Payer: Medicare Other | Admitting: Obstetrics & Gynecology

## 2015-11-02 ENCOUNTER — Ambulatory Visit (INDEPENDENT_AMBULATORY_CARE_PROVIDER_SITE_OTHER): Payer: Medicare Other | Admitting: Licensed Clinical Social Worker

## 2015-11-02 DIAGNOSIS — F3341 Major depressive disorder, recurrent, in partial remission: Secondary | ICD-10-CM

## 2015-11-03 ENCOUNTER — Ambulatory Visit (INDEPENDENT_AMBULATORY_CARE_PROVIDER_SITE_OTHER): Payer: Medicare Other | Admitting: Obstetrics & Gynecology

## 2015-11-03 ENCOUNTER — Encounter: Payer: Self-pay | Admitting: Obstetrics & Gynecology

## 2015-11-03 VITALS — BP 98/60 | HR 64 | Resp 16 | Wt 137.0 lb

## 2015-11-03 DIAGNOSIS — N8110 Cystocele, unspecified: Secondary | ICD-10-CM

## 2015-11-03 DIAGNOSIS — R1031 Right lower quadrant pain: Secondary | ICD-10-CM

## 2015-11-03 DIAGNOSIS — IMO0002 Reserved for concepts with insufficient information to code with codable children: Secondary | ICD-10-CM

## 2015-11-04 ENCOUNTER — Telehealth: Payer: Self-pay | Admitting: Obstetrics & Gynecology

## 2015-11-04 ENCOUNTER — Other Ambulatory Visit: Payer: Self-pay | Admitting: *Deleted

## 2015-11-04 ENCOUNTER — Telehealth: Payer: Self-pay | Admitting: *Deleted

## 2015-11-04 DIAGNOSIS — Z8719 Personal history of other diseases of the digestive system: Secondary | ICD-10-CM

## 2015-11-04 DIAGNOSIS — R1031 Right lower quadrant pain: Secondary | ICD-10-CM

## 2015-11-04 LAB — COMPLETE METABOLIC PANEL WITH GFR
ALT: 23 U/L (ref 6–29)
AST: 25 U/L (ref 10–35)
Albumin: 4.2 g/dL (ref 3.6–5.1)
Alkaline Phosphatase: 67 U/L (ref 33–130)
BUN: 23 mg/dL (ref 7–25)
CALCIUM: 10.3 mg/dL (ref 8.6–10.4)
CHLORIDE: 100 mmol/L (ref 98–110)
CO2: 28 mmol/L (ref 20–31)
CREATININE: 1.09 mg/dL — AB (ref 0.60–0.93)
GFR, EST AFRICAN AMERICAN: 59 mL/min — AB (ref >=60)
GFR, EST NON AFRICAN AMERICAN: 51 mL/min — AB (ref >=60)
Glucose, Bld: 88 mg/dL (ref 65–99)
Potassium: 3.9 mmol/L (ref 3.5–5.3)
Sodium: 140 mmol/L (ref 135–146)
Total Bilirubin: 0.7 mg/dL (ref 0.2–1.2)
Total Protein: 6.7 g/dL (ref 6.1–8.1)

## 2015-11-04 NOTE — Telephone Encounter (Signed)
Patient was seen 11/03/15 and failed to sign a release of medical records for her echocardiogram. I completed the release and mailed to patient asking her to sign and mail or drop off. I left patient a message with this information.

## 2015-11-04 NOTE — Telephone Encounter (Signed)
Patient called. States pessary came out today while she was using the restroom. Needs appt for pessary insertion. Can only come on thursdays. Offered to get her with another provider. Pt ok to wait for Dr. Sabra Heck.

## 2015-11-04 NOTE — Telephone Encounter (Signed)
Please scheduled CT of abdomen/pelvis due to RLQ pain, right hernia.  Thanks.

## 2015-11-04 NOTE — Telephone Encounter (Signed)
Spoke with patient. Patient states that she was using the restroom and her pessary came out. She would like to return for Dr.Miller to place her pessary. Aware Dr.Miller will be out of town next week and would like to schedule for the following Thursday. Appointment scheduled for 11/17/2015 at 3 pm with Dr.Miller. Patient is also asking about scheduling a CT scan. States she is to have this next Wednesday. Advised I will speak with Dr.Miller regarding CT scan and return call with further information. She is agreeable.

## 2015-11-04 NOTE — Telephone Encounter (Signed)
Patient was seen 11/03/15 and failed to sign a release of medical records for her echocardiogram. I completed the release and mailed to patient asking her to sign and mail or drop off. I left patient a message with this information.  11/04/15 Patient called back and will stop by the office next week to sign the medical release in patient pick up drawer.

## 2015-11-04 NOTE — Telephone Encounter (Signed)
Left message to call Regenia Erck at [phone]. 

## 2015-11-07 NOTE — Telephone Encounter (Signed)
Return call to patient.  Scheduled CT Scan at [facility] [place], [place] [address] for Friday 7/7 at [facility] at [place], [place] [address]. Advised she will need to go to [facility] to pick up contrast 1-2 days before appointment and obtain instructions for contrast.   Call to patient to inform. She cannot take this appointment. She wants to schedule at imaging center near hospital. Gave patient information to call  imaging to reschedule appointment at her convenience. She is agreeable and states she will call to set up appointment at a better time/day/location.   Routing to provider for final review. Patient agreeable to disposition. Will close encounter.

## 2015-11-07 NOTE — Progress Notes (Signed)
75 y.o. Married White female G0P0000 here for pessary replacement due to vaginal ulcerations and bleeding from her pessary.  Pt reports her bleeding and discharge have stopped.  She does feel her bladder prolapse.  However, she reports new onset of RLQ pain.  Pt with hx of RLQ hernia that had an incarcerated bowel in it in 5/13.  She reports the pain she is experiencing is like when the hernia was present before she ended up the with urgent surgery due to the incarcerated bowel.    D/w pt she will need to have renal function checked and proceed with CT scan.  Pt would like to proceed.  Denies nausea or fever.  She is passing gas and having bowel movements.    Past Medical History  Diagnosis Date  . Depression   . Grave's disease   . Cancer ([place]) 3/07    right breast/ER/PR positive  . Condyloma 01/1998  . Microhematuria 07/1992    negative IVP, [place]  . Cataract     and dry eye/spots on retina  . MRSA (methicillin resistant Staphylococcus aureus)   . Cataract     surgery on right eye 04/12/14  . Macular degeneration of both eyes   . Shingles     Past Surgical History  Procedure Laterality Date  . Abdominal hysterectomy    . Cholecystectomy    . Eye surgery    . Inguinal hernia repair  09/29/2011    Procedure: HERNIA REPAIR INGUINAL ADULT;  Surgeon: Rolm Bookbinder, MD;  Location: [facility]S;  Service: General;  Laterality: Right;  . Goiter  7/93    radioactive iodine  . Tubal ligation      D&C  . Breast biopsy  1990    left  . Cataract extraction  04/12/14    right eye  . Cataract extraction  2/16    left eye    MEDS:  Reviewed in EPIC and UTD  ALLERGIES: Gabapentin and Sulfa antibiotics  Family History  Problem Relation Age of Onset  . Hypertension Mother   . Heart disease Mother     SH:  Married, non smoker  Review of Systems  All other systems reviewed and are negative.   PHYSICAL EXAMINATION:    BP 98/60 mmHg  Pulse 64  Resp 16  Wt 137 lb (62.143 kg)     General appearance: alert, cooperative and appears stated age CV:  Regular rate and rhythm Lungs:  clear to auscultation, no wheezes, rales or rhonchi, symmetric air entry Abdomen: soft, RLQ tenderness noted, no rebound or guarding, NON SURGICAL ABDOMEN, bowel sounds normal; no masses,  no organomegaly, no clear hernia noted  Pelvic: External genitalia:  no lesions              Urethra:  normal appearing urethra with no masses, tenderness or lesions              Bartholins and Skenes: normal                 Vagina: normal appearing vagina with normal color and discharge, no lesions, ulcerations healed              Cervix: absent              Bimanual Exam:  Uterus:  uterus absent              Adnexa: no mass, fullness, tenderness    Pessary replaced without difficulty.  Chaperone was present for exam.  Assessment:  Cystocele with pessary use RLQ pain H/O RLQ hernia repair with incarcerated bowel 2013  Plan: CMP obtained today CT abdomen/pelvis will be ordered.  Follow up will be planned pending this result Follow up for pessary will be determined depending on results of CT   ~25 minutes spent with patient >50% of time was in face to face discussion of above.

## 2015-11-07 NOTE — Telephone Encounter (Signed)
Patient calling to check status of ct scan appointment.

## 2015-11-10 ENCOUNTER — Ambulatory Visit
Admission: RE | Admit: 2015-11-10 | Discharge: 2015-11-10 | Disposition: A | Discharge status: Home / Self Care | Payer: Medicare Other | Source: Ambulatory Visit | Attending: Obstetrics & Gynecology | Admitting: Obstetrics & Gynecology

## 2015-11-10 DIAGNOSIS — Z8719 Personal history of other diseases of the digestive system: Secondary | ICD-10-CM

## 2015-11-10 DIAGNOSIS — R1031 Right lower quadrant pain: Secondary | ICD-10-CM

## 2015-11-10 MED ORDER — IOPAMIDOL (ISOVUE-300) INJECTION 61%
100.0000 mL | Freq: Once | INTRAVENOUS | Status: AC | PRN
  Administered 2015-11-10: 100 mL via INTRAVENOUS

## 2015-11-11 ENCOUNTER — Other Ambulatory Visit: Payer: Self-pay

## 2015-11-14 ENCOUNTER — Telehealth: Payer: Self-pay | Admitting: *Deleted

## 2015-11-14 NOTE — Telephone Encounter (Signed)
Attempted to call patient, but line went to busy signal. Unable to leave a message.

## 2015-11-14 NOTE — Telephone Encounter (Signed)
-----   Message from Megan Salon, MD sent at 11/14/2015 12:08 PM EDT ----- Please let her know that there was a moderate amount of stool throught the colon and this could contribute to pain.  She is taking Senna daily but should add Miralax every other day as well.

## 2015-11-16 ENCOUNTER — Ambulatory Visit (INDEPENDENT_AMBULATORY_CARE_PROVIDER_SITE_OTHER): Payer: Medicare Other | Admitting: Licensed Clinical Social Worker

## 2015-11-16 DIAGNOSIS — F3341 Major depressive disorder, recurrent, in partial remission: Secondary | ICD-10-CM

## 2015-11-17 ENCOUNTER — Ambulatory Visit (INDEPENDENT_AMBULATORY_CARE_PROVIDER_SITE_OTHER): Payer: Medicare Other | Admitting: Obstetrics & Gynecology

## 2015-11-17 VITALS — BP 108/56 | HR 70 | Resp 16 | Ht 61.0 in | Wt 135.4 lb

## 2015-11-17 DIAGNOSIS — IMO0002 Reserved for concepts with insufficient information to code with codable children: Secondary | ICD-10-CM

## 2015-11-17 DIAGNOSIS — N8110 Cystocele, unspecified: Secondary | ICD-10-CM

## 2015-11-17 DIAGNOSIS — R1031 Right lower quadrant pain: Secondary | ICD-10-CM | POA: Diagnosis not present

## 2015-11-17 NOTE — Telephone Encounter (Signed)
Patient in office for pessary insertion. RN gave results as seen below. Patient verbalized understanding.

## 2015-11-20 ENCOUNTER — Encounter: Payer: Self-pay | Admitting: Obstetrics & Gynecology

## 2015-11-20 NOTE — Progress Notes (Signed)
75 y.o. Married White female G0P0000 here for pessaryPt  replacement and to follow up after having CT of abd/pelvis due to RLQ pain.  Only significant finding was constipation.  Pt does not feel this is the only cause but we reviewed CT together.  Pt wonders if her cholesterol medication is a possible cause.  She changed this medication about 4-6 weeks ago when her symptoms began.  Advised pt to just stop this and see if the symptoms resolve.  She has evidence of cardiac vessel disease so will likely need to be on medication but a short cessation will help in determining whether this is the culprit.  She will let me know if she does ok to with stopping and if pain resolves or continues.  She may need referral to cardiologist for opinion about management.  Echo was reviewed with her today.  Pt thinks, for now, she will leave out her pessary.  With loss of job and trying for unemployment, she is now with her mother a lot and the need for better urinary control is not as important.  I am fine with this but she is welcome to call if changes her mind.  Denies vaginal bleeding or disharge.  She is sexually active.    ROS: 12 system ROS questions were negative except for RLQ pain and constipation  Exam:   BP 108/56 mmHg  Pulse 70  Resp 16  Ht 5\' 1"  (1.549 m)  Wt 135 lb 6.4 oz (61.417 kg)  BMI 25.60 kg/m2  General appearance: alert, appears stated age and no distress  Abd:  Soft, normal BS in all quadrants, no hernias, masses, organomegaly, moderate RLQ pain to deep palpation, no rebound or guarding Inguinal lymph nodes:  not enlarged  Pelvic: External genitalia:  no lesions              Urethra: not indicated              Bartholins and Skenes: normal                 Vagina: normal appearing vagina with normal color and discharge, no lesions, and ulcerations have fully resolved              Cervix: absent   Pap obtained:  no Bimanual Exam:  Uterus:  surgically absent, vaginal cuff well healed                            Adnexa:    no masses                               Anus:  normal sphincter tone, no lesions  Pessary was not replaced per pt request  A: Cystocele- symptomatic      RLQ pain with CT showing constipation that pt feels is due to her cholesterol medication Smoker H/O DVT  P:  Plan to leave pessary out, for now. Pt will stop cholesterol medication for at least two weeks.  She will then call and give update.  If symptoms completely resolve, will refer to cardiology for recommendations.  If no resolution, may need referral to GI.

## 2015-11-30 ENCOUNTER — Ambulatory Visit (INDEPENDENT_AMBULATORY_CARE_PROVIDER_SITE_OTHER): Payer: Medicare Other | Admitting: Licensed Clinical Social Worker

## 2015-11-30 DIAGNOSIS — F3341 Major depressive disorder, recurrent, in partial remission: Secondary | ICD-10-CM

## 2015-12-01 ENCOUNTER — Telehealth: Payer: Self-pay | Admitting: Obstetrics & Gynecology

## 2015-12-01 DIAGNOSIS — I2510 Atherosclerotic heart disease of native coronary artery without angina pectoris: Secondary | ICD-10-CM

## 2015-12-01 DIAGNOSIS — I2583 Coronary atherosclerosis due to lipid rich plaque: Principal | ICD-10-CM

## 2015-12-01 NOTE — Telephone Encounter (Signed)
Spoke with patient. Patient states that she is calling to provide Dr.Miller with an update on how she Is doing since stopping her cholesterol medication and without using her pessary. Reports since stopping her cholesterol medications she is feeling much better and her symptoms have completely resolved. Reports she is doing well without her pessary and feels she is able to empty her bladder completely. Asking if she will need a cardiology referral at this time. Advised per OV note on 11/17/2015 cardiology referral would be needed if symptoms resolved after stopping her cholesterol medication. Patient is agreeable. Also states that she feels she may be getting "sores" in her nose and is requesting a refill on her mupirocin ointment. States this is the only thing that provides her with relief. Advised I will speak with Dr.Miller and return call with further recommendations. She is agreeable.  Dr.Miller, okay to place cardiology referral at this time? Okay to refill Mupirocin at this time as well?

## 2015-12-01 NOTE — Telephone Encounter (Signed)
Patient says Dr Sabra Heck wanted her to call and speak with her in 2 weeks.

## 2015-12-03 ENCOUNTER — Other Ambulatory Visit: Payer: Self-pay | Admitting: Obstetrics & Gynecology

## 2015-12-05 NOTE — Telephone Encounter (Signed)
Patient checking on status for refill request.

## 2015-12-05 NOTE — Telephone Encounter (Signed)
Medication refill request: Bactroban (not on current med list)  Last AEX: ? Last Visit: 11-17-15 Next AEX: not scheduled  Last MMG (if hormonal medication request): 4-5-17WNL  Refill authorized: please advise

## 2015-12-05 NOTE — Telephone Encounter (Signed)
Medication refill request: Mupirocin Ointment Last APPT:  11/17/15 for Cystocele SM Next AEX:  Last MMG (if hormonal medication request): 08/10/15 [id number] Refill authorized: 12/03/14 #[id number]. Please advise. Thank you.

## 2015-12-07 NOTE — Telephone Encounter (Signed)
Pt has atherosclerosis seen on CT scan.  This is why she was started on cholesterol medication.  Does not feel "heard" by PCP so offered cardiology referral for recommendations.  I have no specific request for cardiologist.  Can you please call for appt for pt?  Thanks.

## 2015-12-07 NOTE — Telephone Encounter (Signed)
Spoke with patient. Advised of message as seen below from [facility]. She is agreeable and verbalizes understanding. Requesting an appointment on a Wednesday or a Thursday. Advised I will contact Dr.Nishan's office to schedule appointment and will return call with appointment date and time. She is agreeable. Aware refill of Mupirocin has been sent to her pharmacy on file.  Spoke with [facility] off [address] [place], [place] [address]. Appointment scheduled for 01/04/2016 at 10 am with 9:45 am arrival with [facility] as he has the first new patient appointment.   Spoke with patient. Advised of appointment date, time, and location. Patient is agreeable and verbalizes understanding.  Routing to provider for final review. Patient agreeable to disposition. Will close encounter.

## 2015-12-07 NOTE — Telephone Encounter (Addendum)
Left message to call Craig at [phone].  Need to advised Dr.Miller refilled mupirocin on 12/05/2015. Also need to discuss referral to cardiology and appointment date and time that will work well for her so this can be scheduled.

## 2015-12-20 DIAGNOSIS — R600 Localized edema: Secondary | ICD-10-CM

## 2015-12-21 ENCOUNTER — Ambulatory Visit (INDEPENDENT_AMBULATORY_CARE_PROVIDER_SITE_OTHER): Payer: Medicare Other | Admitting: Licensed Clinical Social Worker

## 2015-12-21 DIAGNOSIS — F3341 Major depressive disorder, recurrent, in partial remission: Secondary | ICD-10-CM | POA: Diagnosis not present

## 2016-01-04 ENCOUNTER — Ambulatory Visit (INDEPENDENT_AMBULATORY_CARE_PROVIDER_SITE_OTHER): Payer: Medicare Other | Admitting: Licensed Clinical Social Worker

## 2016-01-04 ENCOUNTER — Ambulatory Visit (INDEPENDENT_AMBULATORY_CARE_PROVIDER_SITE_OTHER): Payer: Medicare Other | Admitting: Internal Medicine

## 2016-01-04 ENCOUNTER — Encounter: Payer: Self-pay | Admitting: Internal Medicine

## 2016-01-04 VITALS — BP 118/80 | HR 65 | Ht 61.0 in | Wt 132.0 lb

## 2016-01-04 DIAGNOSIS — F17200 Nicotine dependence, unspecified, uncomplicated: Secondary | ICD-10-CM

## 2016-01-04 DIAGNOSIS — E785 Hyperlipidemia, unspecified: Secondary | ICD-10-CM | POA: Diagnosis not present

## 2016-01-04 DIAGNOSIS — I739 Peripheral vascular disease, unspecified: Secondary | ICD-10-CM

## 2016-01-04 DIAGNOSIS — R079 Chest pain, unspecified: Secondary | ICD-10-CM | POA: Diagnosis not present

## 2016-01-04 DIAGNOSIS — I2510 Atherosclerotic heart disease of native coronary artery without angina pectoris: Secondary | ICD-10-CM

## 2016-01-04 DIAGNOSIS — F3341 Major depressive disorder, recurrent, in partial remission: Secondary | ICD-10-CM | POA: Diagnosis not present

## 2016-01-04 DIAGNOSIS — Z720 Tobacco use: Secondary | ICD-10-CM

## 2016-01-04 DIAGNOSIS — I2584 Coronary atherosclerosis due to calcified coronary lesion: Secondary | ICD-10-CM

## 2016-01-04 LAB — COMPREHENSIVE METABOLIC PANEL
ALK PHOS: 65 U/L (ref 33–130)
ALT: 13 U/L (ref 6–29)
AST: 23 U/L (ref 10–35)
Albumin: 4.2 g/dL (ref 3.6–5.1)
BUN: 29 mg/dL — AB (ref 7–25)
CO2: 28 mmol/L (ref 20–31)
CREATININE: 1.2 mg/dL — AB (ref 0.60–0.93)
Calcium: 9.8 mg/dL (ref 8.6–10.4)
Chloride: 99 mmol/L (ref 98–110)
GLUCOSE: 88 mg/dL (ref 65–99)
POTASSIUM: 3.8 mmol/L (ref 3.5–5.3)
Sodium: 141 mmol/L (ref 135–146)
Total Bilirubin: 1.1 mg/dL (ref 0.2–1.2)
Total Protein: 7 g/dL (ref 6.1–8.1)

## 2016-01-04 NOTE — Progress Notes (Signed)
OFFICE NOTE  Chief Complaint:  Statin intolerance, abnormal echocardiogram  Primary Care Physician: Tommy Medal, MD  HPI:  Victoria Sims is a 75 y.o. female who is kindly referred to me for evaluation and management of recent statin intolerance. She's had problems with her digestive tract for some time and was placed on a statin recently due to some abnormal findings on a CT scan. This was a screening CT scan of her lungs as she is a smoker. It did not demonstrate any lung masses but did demonstrate coronary artery calcification. She was placed on statin therapy but to my knowledge has not had a cholesterol profile recently. She noted that the statins cause worsening constipation and stopped the medication. In addition she was sent to Dr. Irven Shelling office for an echocardiogram. This demonstrated a low normal EF of 50-55% with reportedly normal diastolic function, and trace tricuspid regurgitation with mild pulmonic regurgitation. 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 Her energy level has decreased as well. She denies any frank chest pain but does get short of breath doing certain activities. She also reports pain in her calf when walking that gets better at rest. This sometimes limits her walking long distances.   PMHx:  Past Medical History:  Diagnosis Date  . Cancer ([place]) 3/07   right breast/ER/PR positive  . Cataract    and dry eye/spots on retina  . Cataract    surgery on right eye 04/12/14  . Condyloma 01/1998  . Depression   . Grave's disease   . Macular degeneration of both eyes   . Microhematuria 07/1992   negative IVP, [place]  . MRSA (methicillin resistant Staphylococcus aureus)   . Shingles     Past Surgical History:  Procedure Laterality Date  . ABDOMINAL HYSTERECTOMY    . BREAST BIOPSY  1990   left  . CATARACT EXTRACTION  04/12/14   right eye  . CATARACT EXTRACTION  2/16     left eye  . CHOLECYSTECTOMY    . EYE SURGERY    . goiter  7/93   radioactive iodine  . INGUINAL HERNIA REPAIR  09/29/2011   Procedure: HERNIA REPAIR INGUINAL ADULT;  Surgeon: Rolm Bookbinder, MD;  Location: [facility];  Service: General;  Laterality: Right;  . TUBAL LIGATION     D&C    FAMHx:  Family History  Problem Relation Age of Onset  . Hypertension Mother   . Heart disease Mother     SOCHx:   reports that she has been smoking Cigarettes.  She has a 54.00 pack-year smoking history. She has never used smokeless tobacco. She reports that she does not drink alcohol or use drugs.  ALLERGIES:  Allergies  Allergen Reactions  . Gabapentin Nausea And Vomiting  . Sulfa Antibiotics Rash    ROS: Pertinent items noted in HPI and remainder of comprehensive ROS otherwise negative.  HOME MEDS: Current Outpatient Prescriptions  Medication Sig Dispense Refill  . ALPRAZolam (XANAX) 1 MG tablet Take 1 tablet (1 mg total) by mouth at bedtime as needed for sleep. 30 tablet 1  . aspirin 81 MG EC tablet Take 81 mg by mouth daily.  99  . Calcium-Vitamin D (CALTRATE 600 PLUS-VIT D PO) Take 1 tablet by mouth daily.    . Cholecalciferol (D3-1000) 1000 UNITS capsule Take 1,000 Units by mouth daily.    Marland Kitchen CLONAZEPAM PO Take by mouth as needed.    Marland Kitchen  escitalopram (LEXAPRO) 20 MG tablet Take 40 mg by mouth daily.    . furosemide (LASIX) 40 MG tablet Take 40 mg by mouth daily.    Marland Kitchen HYDROcodone-acetaminophen (NORCO/VICODIN) 5-325 MG tablet Take 1 tablet by mouth every 6 (six) hours as needed for moderate pain. 30 tablet 0  . levothyroxine (SYNTHROID, LEVOTHROID) 100 MCG tablet Take 100 mcg by mouth daily.    . metroNIDAZOLE (METROGEL) 0.75 % vaginal gel Place 1 Applicatorful vaginally at bedtime. 70 g 1  . Multiple Vitamins-Minerals (PRESERVISION AREDS PO) Take by mouth daily. Reported on 09/29/2015    . mupirocin ointment (BACTROBAN) 2 % APPLY 1 APPLICATION INTO NOSE TWICE DAILY 22 g 0  .  pantoprazole (PROTONIX) 40 MG tablet Take 40 mg by mouth daily.    . potassium chloride (K-DUR) 10 MEQ tablet Take 10 mEq by mouth daily. Take 1 tablet on Monday, Tuesday, and Wednesday only. Do not take Thursday, Friday, Saturday, Sunday    . Sennosides-Docusate Sodium ([employer]S SENNA-C PLUS PO) Take by mouth.     No current facility-administered medications for this visit.     LABS/IMAGING: No results found for this or any previous visit (from the past 48 hour(s)). No results found.  WEIGHTS: Wt Readings from Last 3 Encounters:  01/04/16 132 lb (59.9 kg)  11/17/15 135 lb 6.4 oz (61.4 kg)  11/03/15 137 lb (62.1 kg)    VITALS: BP 118/80   Pulse 65   Ht 5\' 1"  (1.549 m)   Wt 132 lb (59.9 kg)   BMI 24.94 kg/m   EXAM: General appearance: alert and no distress Neck: no carotid bruit and no JVD Lungs: clear to auscultation bilaterally Heart: regular rate and rhythm, S1, S2 normal, no murmur, click, rub or gallop Abdomen: soft, non-tender; bowel sounds normal; no masses,  no organomegaly Extremities: extremities normal, atraumatic, no cyanosis or edema Pulses: Faint DP/PT pulses Skin: Skin color, texture, turgor normal. No rashes or lesions Neurologic: Grossly normal Psych: Pleasant  EKG: Normal sinus rhythm at 65, nonspecific ST-T wave changes  ASSESSMENT: 1. Progressive fatigue and dyspnea 2. Multivessel coronary artery calcium by CT 3. Dyslipidemia-intolerant to statins 4. Low normal LVEF of 50% by echo in June 2017 5. Claudication  PLAN: 1.   Victoria Sims is describing fatigue and progressive dyspnea and is noted to have multivessel coronary artery calcium by CT. I'm concerned about obstructive coronary disease and recommend a Lexiscan Myoview. In addition she's having difficulty walking with symptoms that sound like claudication. She does have somewhat decreased pulses and cool feet. I recommend lower extremity arterial Dopplers as well as a screening abdominal aortic  aneurysm ultrasound as she is a smoker. I will also obtain an advanced lipid profile and comprehensive metabolic profile. Plan to see her back to discuss those findings in a few weeks.  Thanks again for the kind referral.  Pixie Casino, MD, [facility] Attending Cardiologist [place] 01/04/2016, 5:45 PM

## 2016-01-04 NOTE — Patient Instructions (Addendum)
Your physician recommends that you return for lab work FASTING ([facility], [place])   Your physician has requested that you have a Venturia. For further information please visit [email or web address]. Please follow instruction sheet, as given.  Your physician has requested that you have an abdominal aorta duplex. During this test, an ultrasound is used to evaluate the aorta. Allow 30 minutes for this exam. Do not eat after midnight the day before and avoid carbonated beverages  Your physician has requested that you have a lower extremity arterial duplex. This test is an ultrasound of the arteries in the legs. It looks at arterial blood flow in the legs. Allow one hour for Lower Arterial scans. There are no restrictions or special instructions  Your physician recommends that you schedule a follow-up appointment after your testing.

## 2016-01-07 LAB — CARDIO IQ(R) ADVANCED LIPID PANEL
APOLIPOPROTEIN (CARDIO IQ ADV LIPID PANEL): 150 mg/dL — AB (ref 49–103)
CHOLESTEROL/HDL RATIO (CARDIO IQ ADV LIPID PANEL): 5 calc — AB (ref <5.0)
Cholesterol, Total: 285 mg/dL — ABNORMAL HIGH (ref <200)
HDL CHOLESTEROL (CARDIO IQ ADV LIPID PANEL): 57 mg/dL (ref >50)
LDL CHOLESTEROL CALCULATED (CARDIO IQ ADV LIPID PANEL): 208 mg/dL — AB (ref <100)
LDL Large: 5423 nmol/L (ref 5038–17886)
LDL Medium: 547 nmol/L — ABNORMAL HIGH (ref 121–397)
LDL Particle Number: 2221 nmol/L — ABNORMAL HIGH (ref 1016–2185)
LDL Peak Size: 220.5 Angstrom (ref >=218.2)
LDL Small: 281 nmol/L (ref 115–386)
LIPOPROTEIN (A) (CARDIO IQ ADV LIPID PANEL): 185 nmol/L — AB (ref <75)
NON-HDL CHOLESTEROL (CARDIO IQ ADV LIPID PANEL): 228 mg/dL — AB (ref <130)
TRIGLYCERIDES (CARDIO IQ ADV LIPID PANEL): 82 mg/dL (ref <150)

## 2016-01-11 ENCOUNTER — Other Ambulatory Visit: Payer: Self-pay | Admitting: Internal Medicine

## 2016-01-11 DIAGNOSIS — F17200 Nicotine dependence, unspecified, uncomplicated: Secondary | ICD-10-CM

## 2016-01-11 DIAGNOSIS — I739 Peripheral vascular disease, unspecified: Secondary | ICD-10-CM

## 2016-01-11 DIAGNOSIS — I2510 Atherosclerotic heart disease of native coronary artery without angina pectoris: Secondary | ICD-10-CM

## 2016-01-11 DIAGNOSIS — E039 Hypothyroidism, unspecified: Secondary | ICD-10-CM | POA: Diagnosis not present

## 2016-01-11 DIAGNOSIS — E785 Hyperlipidemia, unspecified: Secondary | ICD-10-CM | POA: Diagnosis not present

## 2016-01-13 DIAGNOSIS — Z23 Encounter for immunization: Secondary | ICD-10-CM | POA: Diagnosis not present

## 2016-01-18 DIAGNOSIS — D709 Neutropenia, unspecified: Secondary | ICD-10-CM | POA: Diagnosis not present

## 2016-01-18 DIAGNOSIS — F339 Major depressive disorder, recurrent, unspecified: Secondary | ICD-10-CM | POA: Diagnosis not present

## 2016-01-18 DIAGNOSIS — J449 Chronic obstructive pulmonary disease, unspecified: Secondary | ICD-10-CM | POA: Diagnosis not present

## 2016-01-18 DIAGNOSIS — I700 Atherosclerosis of aorta: Secondary | ICD-10-CM | POA: Diagnosis not present

## 2016-01-20 ENCOUNTER — Telehealth (HOSPITAL_COMMUNITY): Payer: Self-pay

## 2016-01-20 NOTE — Telephone Encounter (Signed)
Encounter complete. 

## 2016-01-24 ENCOUNTER — Encounter (HOSPITAL_COMMUNITY): Payer: Self-pay

## 2016-01-24 ENCOUNTER — Other Ambulatory Visit (HOSPITAL_COMMUNITY): Payer: Self-pay

## 2016-01-25 ENCOUNTER — Telehealth: Payer: Self-pay | Admitting: *Deleted

## 2016-01-25 ENCOUNTER — Ambulatory Visit (HOSPITAL_COMMUNITY)
Admission: RE | Admit: 2016-01-25 | Discharge: 2016-01-25 | Disposition: A | Discharge status: Home / Self Care | Payer: Medicare Other | Source: Ambulatory Visit | Attending: Internal Medicine | Admitting: Internal Medicine

## 2016-01-25 DIAGNOSIS — E785 Hyperlipidemia, unspecified: Secondary | ICD-10-CM | POA: Insufficient documentation

## 2016-01-25 DIAGNOSIS — I739 Peripheral vascular disease, unspecified: Secondary | ICD-10-CM | POA: Insufficient documentation

## 2016-01-25 DIAGNOSIS — R079 Chest pain, unspecified: Secondary | ICD-10-CM | POA: Diagnosis not present

## 2016-01-25 DIAGNOSIS — Z136 Encounter for screening for cardiovascular disorders: Secondary | ICD-10-CM | POA: Diagnosis not present

## 2016-01-25 DIAGNOSIS — I2584 Coronary atherosclerosis due to calcified coronary lesion: Secondary | ICD-10-CM | POA: Insufficient documentation

## 2016-01-25 DIAGNOSIS — I2510 Atherosclerotic heart disease of native coronary artery without angina pectoris: Secondary | ICD-10-CM | POA: Diagnosis not present

## 2016-01-25 DIAGNOSIS — Z720 Tobacco use: Secondary | ICD-10-CM | POA: Diagnosis not present

## 2016-01-25 DIAGNOSIS — F17200 Nicotine dependence, unspecified, uncomplicated: Secondary | ICD-10-CM

## 2016-01-25 DIAGNOSIS — I780 Hereditary hemorrhagic telangiectasia: Secondary | ICD-10-CM | POA: Diagnosis not present

## 2016-01-25 LAB — MYOCARDIAL PERFUSION IMAGING
CHL CUP NUCLEAR SDS: 1
CHL CUP NUCLEAR SRS: 0
CHL CUP NUCLEAR SSS: 1
CSEPPHR: 72 {beats}/min
LV dias vol: 62 mL (ref 46–106)
LV sys vol: 27 mL
Rest HR: 45 {beats}/min
TID: 1.58

## 2016-01-25 MED ORDER — AMINOPHYLLINE 25 MG/ML IV SOLN
75.0000 mg | Freq: Once | INTRAVENOUS | Status: AC
  Administered 2016-01-25: 75 mg via INTRAVENOUS

## 2016-01-25 MED ORDER — TECHNETIUM TC 99M TETROFOSMIN IV KIT
30.9000 | PACK | Freq: Once | INTRAVENOUS | Status: AC | PRN
  Administered 2016-01-25: 30.9 via INTRAVENOUS
  Filled 2016-01-25: qty 31

## 2016-01-25 MED ORDER — TECHNETIUM TC 99M TETROFOSMIN IV KIT
10.5000 | PACK | Freq: Once | INTRAVENOUS | Status: AC | PRN
  Administered 2016-01-25: 11 via INTRAVENOUS
  Filled 2016-01-25: qty 11

## 2016-01-25 MED ORDER — REGADENOSON 0.4 MG/5ML IV SOLN
0.4000 mg | Freq: Once | INTRAVENOUS | Status: AC
  Administered 2016-01-25: 0.4 mg via INTRAVENOUS

## 2016-01-25 NOTE — Telephone Encounter (Signed)
Left message for pt to call.

## 2016-01-25 NOTE — Telephone Encounter (Signed)
-----   Message from Pixie Casino, MD sent at 01/25/2016  5:33 PM EDT ----- Low risk stress test, no ischemia. LVEF 57%.  Dr. Lemmie Evens

## 2016-01-26 NOTE — Telephone Encounter (Signed)
Returned call to patient-made aware of results.  Pt verbalized understanding and is aware of f/u with MD Hilty on 10/4.

## 2016-01-26 NOTE — Telephone Encounter (Signed)
F/u ° ° ° ° ° °Pt returning nurse call.  °

## 2016-02-02 ENCOUNTER — Ambulatory Visit (INDEPENDENT_AMBULATORY_CARE_PROVIDER_SITE_OTHER): Payer: Medicare Other | Admitting: Obstetrics & Gynecology

## 2016-02-02 ENCOUNTER — Ambulatory Visit: Payer: Medicare Other | Admitting: Obstetrics & Gynecology

## 2016-02-02 VITALS — BP 100/60 | HR 64 | Resp 14 | Ht 61.0 in | Wt 134.0 lb

## 2016-02-02 DIAGNOSIS — I2510 Atherosclerotic heart disease of native coronary artery without angina pectoris: Secondary | ICD-10-CM

## 2016-02-02 DIAGNOSIS — G4700 Insomnia, unspecified: Secondary | ICD-10-CM

## 2016-02-02 DIAGNOSIS — Z658 Other specified problems related to psychosocial circumstances: Secondary | ICD-10-CM | POA: Diagnosis not present

## 2016-02-02 MED ORDER — TRAZODONE HCL 50 MG PO TABS: 50.0000 mg | ORAL_TABLET | Freq: Every evening | ORAL | 0 refills | Status: DC | PRN

## 2016-02-02 NOTE — Progress Notes (Signed)
GYNECOLOGY  VISIT   HPI: 75 y.o. G0 MWF here for talk.  Lots of stressors in her life.  Mother is now receiving hospice in home services.  Pt still doing a lot of care for mother.  Unemployment expires in two weeks.  Has gotten some job offers, though.  Just not sure how she can do her mother's care and work.  Has been extremely resistant to getting additional help for her mother.  Reports she had a cholesterol test last week that was 285 and she is back on her Pravastatin.    Did more extensive cardiac evaluation last week and she had LE dopplers and myocardial perfusion test.  She was advised that her heart rate was low.  Has appt Oct to review results and to see if there is anything else she needs to be done.  Her biggest issue is just worrying and insomnia.  It is worse at night.  She is worried about losing her home.  She has looked in [place] for work.  She cannot drive.  She just doesn't know what to do.    GYNECOLOGIC HISTORY: No LMP recorded. Patient has had a hysterectomy. Contraception: Post menopausal. Menopausal hormone therapy: None  Patient Active Problem List   Diagnosis Date Noted  . Claudication ([place]) 01/04/2016  . Dyslipidemia 01/04/2016  . Calcification of coronary artery 01/04/2016  . Chest pain, unspecified 01/04/2016  . Cystocele 06/06/2014  . Acid reflux 03/31/2014  . H/O malignant neoplasm of breast 03/31/2014  . H/O infectious disease 03/31/2014  . HLD (hyperlipidemia) 03/31/2014  . OP (osteoporosis) 03/31/2014  . Smoker 03/31/2014  . MRSA (methicillin resistant staph aureus) culture positive 12/04/2013  . History of DVT (deep vein thrombosis) 05/18/2013  . Cataract 01/20/2013  . 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 Urge incontinence 07/11/2011    Past Medical History:  Diagnosis Date  . Cancer ([place]) 3/07   right breast/ER/PR positive  . Cataract    and dry eye/spots on retina  . Cataract    surgery on right eye 04/12/14  . Condyloma 01/1998  . Depression    . Grave's disease   . Macular degeneration of both eyes   . Microhematuria 07/1992   negative IVP, [place]  . MRSA (methicillin resistant Staphylococcus aureus)   . Shingles     Past Surgical History:  Procedure Laterality Date  . ABDOMINAL HYSTERECTOMY    . BREAST BIOPSY  1990   left  . CATARACT EXTRACTION  04/12/14   right eye  . CATARACT EXTRACTION  2/16   left eye  . CHOLECYSTECTOMY    . EYE SURGERY    . goiter  7/93   radioactive iodine  . INGUINAL HERNIA REPAIR  09/29/2011   Procedure: HERNIA REPAIR INGUINAL ADULT;  Surgeon: Rolm Bookbinder, MD;  Location: [facility];  Service: General;  Laterality: Right;  . TUBAL LIGATION     D&C    MEDS:  Reviewed in EPIC and UTD  ALLERGIES: Gabapentin and Sulfa antibiotics  Family History  Problem Relation Age of Onset  . Hypertension Mother   . Heart disease Mother     SH:  Married, non smoker  Review of Systems  Psychiatric/Behavioral: The patient is nervous/anxious and has insomnia.     PHYSICAL EXAMINATION:   Vitals:   02/02/16 1437  BP: 100/60  Pulse: 64  Resp: 14   General appearance: alert, cooperative and appears stated age No other exam performed.   Assessment: Psychosocial stressors Anxiety Insomnia Smoker  Plan:  Trial of Trazodone 25mg  nightly prn.  Can increase to 50mg .  Support offered.  insomanUnfortunately, I do not have many solutions to offer pt.    ~15 minutes spent with patient >50% of time was in face to face discussion of above.

## 2016-02-04 ENCOUNTER — Encounter: Payer: Self-pay | Admitting: Obstetrics & Gynecology

## 2016-02-08 ENCOUNTER — Ambulatory Visit (INDEPENDENT_AMBULATORY_CARE_PROVIDER_SITE_OTHER): Payer: Medicare Other | Admitting: Internal Medicine

## 2016-02-08 ENCOUNTER — Encounter: Payer: Self-pay | Admitting: Internal Medicine

## 2016-02-08 VITALS — BP 102/56 | HR 55 | Ht 61.0 in | Wt 134.0 lb

## 2016-02-08 DIAGNOSIS — I2510 Atherosclerotic heart disease of native coronary artery without angina pectoris: Secondary | ICD-10-CM | POA: Diagnosis not present

## 2016-02-08 DIAGNOSIS — E784 Other hyperlipidemia: Secondary | ICD-10-CM

## 2016-02-08 DIAGNOSIS — I2584 Coronary atherosclerosis due to calcified coronary lesion: Secondary | ICD-10-CM | POA: Diagnosis not present

## 2016-02-08 DIAGNOSIS — E7849 Other hyperlipidemia: Secondary | ICD-10-CM

## 2016-02-08 DIAGNOSIS — F17200 Nicotine dependence, unspecified, uncomplicated: Secondary | ICD-10-CM

## 2016-02-08 MED ORDER — ATORVASTATIN CALCIUM 80 MG PO TABS: 80.0000 mg | ORAL_TABLET | Freq: Every day | ORAL | 3 refills | Status: DC

## 2016-02-08 NOTE — Progress Notes (Signed)
OFFICE NOTE  Chief Complaint:  Follow-up  Primary Care Physician: Thressa Sheller, MD  HPI:  Victoria Sims is a 75 y.o. female who is kindly referred to me for evaluation and management of recent statin intolerance. She's had problems with her digestive tract for some time and was placed on a statin recently due to some abnormal findings on a CT scan. This was a screening CT scan of her lungs as she is a smoker. It did not demonstrate any lung masses but did demonstrate coronary artery calcification. She was placed on statin therapy but to my knowledge has not had a cholesterol profile recently. She noted that the statins cause worsening constipation and stopped the medication. In addition she was sent to Dr. Irven Shelling office for an echocardiogram. This demonstrated a low normal EF of 50-55% with reportedly normal diastolic function, and trace tricuspid regurgitation with mild pulmonic regurgitation. 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 Her energy level has decreased as well. She denies any frank chest pain but does get short of breath doing certain activities. She also reports pain in her calf when walking that gets better at rest. This sometimes limits her walking long distances.  02/08/2016  Mrs. Gaspar Garbe returns today for follow-up. She underwent a recent lipid profile in August 2017 which showed total cholesterol 285, LDL 208 and a very high LDL particle number of 20-21. April be level is 150 and LPa was elevated at 185. This indicates a marked mixed dyslipidemia. Unfortunately she had been intolerant to statins. She is currently on pravastatin 80 however I do not think this is likely to get her to her goal LDL. She also underwent stress testing which was negative for ischemia and lower extremity arterial Dopplers which showed normal ABIs and no evidence of abdominal aortic aneurysm given her  history of smoking.   PMHx:  Past Medical History:  Diagnosis Date  . Cancer ([place]) 3/07   right breast/ER/PR positive  . Cataract    and dry eye/spots on retina  . Cataract    surgery on right eye 04/12/14  . Condyloma 01/1998  . Depression   . Grave's disease   . Macular degeneration of both eyes   . Microhematuria 07/1992   negative IVP, [place]  . MRSA (methicillin resistant Staphylococcus aureus)   . Shingles     Past Surgical History:  Procedure Laterality Date  . ABDOMINAL HYSTERECTOMY    . BREAST BIOPSY  1990   left  . CATARACT EXTRACTION  04/12/14   right eye  . CATARACT EXTRACTION  2/16   left eye  . CHOLECYSTECTOMY    . EYE SURGERY    . goiter  7/93   radioactive iodine  . INGUINAL HERNIA REPAIR  09/29/2011   Procedure: HERNIA REPAIR INGUINAL ADULT;  Surgeon: Rolm Bookbinder, MD;  Location: [facility];  Service: General;  Laterality: Right;  . TUBAL LIGATION     D&C    FAMHx:  Family History  Problem Relation Age of Onset  . Hypertension Mother   . Heart disease Mother     SOCHx:   reports that she has been smoking Cigarettes.  She has a 54.00 pack-year smoking history. She has never used smokeless tobacco. She reports that she does not drink alcohol or use drugs.  ALLERGIES:  Allergies  Allergen Reactions  . Gabapentin Nausea And Vomiting  . Sulfa Antibiotics Rash  ROS: Pertinent items noted in HPI and remainder of comprehensive ROS otherwise negative.  HOME MEDS: Current Outpatient Prescriptions  Medication Sig Dispense Refill  . ALPRAZolam (XANAX) 1 MG tablet Take 1 tablet (1 mg total) by mouth at bedtime as needed for sleep. 30 tablet 1  . aspirin 81 MG EC tablet Take 81 mg by mouth daily.  99  . Calcium-Vitamin D (CALTRATE 600 PLUS-VIT D PO) Take 1 tablet by mouth daily.    . Cholecalciferol (D3-1000) 1000 UNITS capsule Take 1,000 Units by mouth daily.    Marland Kitchen CLONAZEPAM PO Take by mouth as needed.    Marland Kitchen escitalopram (LEXAPRO) 20 MG tablet Take 40  mg by mouth daily.    . furosemide (LASIX) 40 MG tablet Take 40 mg by mouth daily.    Marland Kitchen HYDROcodone-acetaminophen (NORCO/VICODIN) 5-325 MG tablet Take 1 tablet by mouth every 6 (six) hours as needed for moderate pain. 30 tablet 0  . levothyroxine (SYNTHROID, LEVOTHROID) 100 MCG tablet Take 100 mcg by mouth daily.    . metroNIDAZOLE (METROGEL) 0.75 % vaginal gel Place 1 Applicatorful vaginally at bedtime. 70 g 1  . Multiple Vitamins-Minerals (PRESERVISION AREDS PO) Take by mouth daily. Reported on 09/29/2015    . mupirocin ointment (BACTROBAN) 2 % APPLY 1 APPLICATION INTO NOSE TWICE DAILY 22 g 0  . pantoprazole (PROTONIX) 40 MG tablet Take 40 mg by mouth daily.    . potassium chloride (K-DUR) 10 MEQ tablet Take 10 mEq by mouth daily. Take 1 tablet on Monday, Tuesday, and Wednesday only. Do not take Thursday, Friday, Saturday, Sunday    . Sennosides-Docusate Sodium (CVS SENNA-C PLUS PO) Take by mouth.    . traZODone (DESYREL) 50 MG tablet Take 1 tablet (50 mg total) by mouth at bedtime as needed for sleep. 30 tablet 0  . atorvastatin (LIPITOR) 80 MG tablet Take 1 tablet (80 mg total) by mouth daily. 90 tablet 3   No current facility-administered medications for this visit.     LABS/IMAGING: No results found for this or any previous visit (from the past 48 hour(s)). No results found.  WEIGHTS: Wt Readings from Last 3 Encounters:  02/08/16 134 lb (60.8 kg)  02/02/16 134 lb (60.8 kg)  01/25/16 132 lb (59.9 kg)    VITALS: BP (!) 102/56   Pulse (!) 55   Ht 5\' 1"  (1.549 m)   Wt 134 lb (60.8 kg)   BMI 25.32 kg/m   EXAM: Deferred  EKG: Deferred  ASSESSMENT: 1. Progressive fatigue and dyspnea 2. Multivessel coronary artery calcium by CT 3. Dyslipidemia-intolerant to statins 4. Low normal LVEF of 50% by echo in June 2017 5. Claudication  PLAN: 1.   Mrs. Rardon is not at goal cholesterol and will likely not reset on pravastatin. She is not previously tried 8 atorvastatin and I  like to switch her to that today. We'll start with a atorvastatin 40 mg daily and increase it if it is tolerated up to 80 mg likely after a few months if she is not at goal cholesterol. Repeat lipid profile in 3 months.  Pixie Casino, MD, [facility] Attending Cardiologist [place] 02/08/2016, 5:20 PM

## 2016-02-08 NOTE — Patient Instructions (Addendum)
Medication Instructions:  START Lipitor 80mg  Take 1 tab by mouth daily STOP Pravastatin Labwork: Your physician recommends that you return for lab work in: 3 months fasting lipid  Testing/Procedures: None   Follow-Up: Your physician wants you to follow-up in: 6 months with Dr Debara Pickett. You will receive a reminder letter in the mail two months in advance. If you don't receive a letter, please call our office to schedule the follow-up appointment.  Any Other Special Instructions Will Be Listed Below (If Applicable).     If you need a refill on your cardiac medications before your next appointment, please call your pharmacy.

## 2016-02-15 ENCOUNTER — Ambulatory Visit (INDEPENDENT_AMBULATORY_CARE_PROVIDER_SITE_OTHER): Payer: Medicare Other | Admitting: Licensed Clinical Social Worker

## 2016-02-15 DIAGNOSIS — F3341 Major depressive disorder, recurrent, in partial remission: Secondary | ICD-10-CM

## 2016-02-29 ENCOUNTER — Ambulatory Visit (INDEPENDENT_AMBULATORY_CARE_PROVIDER_SITE_OTHER): Payer: Medicare Other | Admitting: Licensed Clinical Social Worker

## 2016-02-29 DIAGNOSIS — F3341 Major depressive disorder, recurrent, in partial remission: Secondary | ICD-10-CM | POA: Diagnosis not present

## 2016-03-21 ENCOUNTER — Ambulatory Visit (INDEPENDENT_AMBULATORY_CARE_PROVIDER_SITE_OTHER): Payer: Medicare Other | Admitting: Licensed Clinical Social Worker

## 2016-03-21 DIAGNOSIS — F3341 Major depressive disorder, recurrent, in partial remission: Secondary | ICD-10-CM

## 2016-04-04 ENCOUNTER — Ambulatory Visit (INDEPENDENT_AMBULATORY_CARE_PROVIDER_SITE_OTHER): Payer: Medicare Other | Admitting: Licensed Clinical Social Worker

## 2016-04-04 DIAGNOSIS — F3341 Major depressive disorder, recurrent, in partial remission: Secondary | ICD-10-CM | POA: Diagnosis not present

## 2016-04-11 ENCOUNTER — Ambulatory Visit (INDEPENDENT_AMBULATORY_CARE_PROVIDER_SITE_OTHER): Payer: Medicare Other | Admitting: Orthopaedic Surgery

## 2016-04-11 ENCOUNTER — Encounter (INDEPENDENT_AMBULATORY_CARE_PROVIDER_SITE_OTHER): Payer: Self-pay | Admitting: Orthopaedic Surgery

## 2016-04-11 VITALS — BP 104/62 | HR 72 | Resp 14 | Ht 61.0 in | Wt 132.0 lb

## 2016-04-11 DIAGNOSIS — M25511 Pain in right shoulder: Secondary | ICD-10-CM

## 2016-04-11 DIAGNOSIS — G8929 Other chronic pain: Secondary | ICD-10-CM | POA: Diagnosis not present

## 2016-04-11 DIAGNOSIS — I2510 Atherosclerotic heart disease of native coronary artery without angina pectoris: Secondary | ICD-10-CM | POA: Diagnosis not present

## 2016-04-11 NOTE — Progress Notes (Signed)
Office Visit Note   Patient: Victoria Sims           Date of Birth: Aug 30, 1940           MRN: [id number] Visit Date: 04/11/2016              Requested by: Thressa Sheller, MD [address], [place], [place] [address] PCP: Thressa Sheller, MD   Assessment & Plan: Visit Diagnoses: Recurrent trigger point tenderness posterior right shoulder. A new problem of impingement right shoulder. I will inject the trigger point areas of tenderness and the subacromial region today and have her follow-up as needed. At some point we might want to consider an MRI scan.  Plan: We will plan to see [name] on a when necessary basis  Follow-Up Instructions: No Follow-up on file.   Orders:  No orders of the defined types were placed in this encounter.  No orders of the defined types were placed in this encounter.     Procedures: Trigger Point Inj Date/Time: 04/11/2016 9:14 AM Performed by: Garald Balding Authorized by: Garald Balding   Consent Given by:  Patient     Clinical Data: No additional findings.   Subjective: No chief complaint on file.   Pt complaining of Right shoulder pain radiating down to her right elbow. No numbness or tingling in figers or hands.    Review of Systems   Objective: Vital Signs: There were no vitals taken for this visit.  Physical Exam  Ortho Exam several areas of trigger point tenderness identified in the posterior shoulder joint not new. Positive impingement and positive empty can testing. Some areas of tenderness about the subacromial region. Full overhead motion with a painful arc. No popping or clicking.  Specialty Comments:  No specialty comments available.  Imaging: No results found.   PMFS History: Patient Active Problem List   Diagnosis Date Noted  . ASCVD (arteriosclerotic cardiovascular disease) 02/08/2016  . Claudication ([place]) 01/04/2016  . Dyslipidemia 01/04/2016  . Calcification of coronary  artery 01/04/2016  . Chest pain, unspecified 01/04/2016  . Cystocele 06/06/2014  . Acid reflux 03/31/2014  . H/O malignant neoplasm of breast 03/31/2014  . H/O infectious disease 03/31/2014  . HLD (hyperlipidemia) 03/31/2014  . OP (osteoporosis) 03/31/2014  . Smoker 03/31/2014  . MRSA (methicillin resistant staph aureus) culture positive 12/04/2013  . History of DVT (deep vein thrombosis) 05/18/2013  . Cataract 01/20/2013  . 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 Urge incontinence 07/11/2011   Past Medical History:  Diagnosis Date  . Cancer ([place]) 3/07   right breast/ER/PR positive  . Cataract    and dry eye/spots on retina  . Cataract    surgery on right eye 04/12/14  . Condyloma 01/1998  . Depression   . Grave's disease   . Macular degeneration of both eyes   . Microhematuria 07/1992   negative IVP, [place]  . MRSA (methicillin resistant Staphylococcus aureus)   . Shingles     Family History  Problem Relation Age of Onset  . Hypertension Mother   . Heart disease Mother     Past Surgical History:  Procedure Laterality Date  . ABDOMINAL HYSTERECTOMY    . BREAST BIOPSY  1990   left  . CATARACT EXTRACTION  04/12/14   right eye  . CATARACT EXTRACTION  2/16   left eye  . CHOLECYSTECTOMY    . EYE SURGERY    . goiter  7/93   radioactive iodine  . INGUINAL HERNIA  REPAIR  09/29/2011   Procedure: HERNIA REPAIR INGUINAL ADULT;  Surgeon: Rolm Bookbinder, MD;  Location: [facility];  Service: General;  Laterality: Right;  . TUBAL LIGATION     D&C   Social History   Occupational History  . Not on file.   Social History Main Topics  . Smoking status: Current Every Day Smoker    Packs/day: 1.00    Years: 54.00    Types: Cigarettes  . Smokeless tobacco: Never Used  . Alcohol use No  . Drug use: No  . Sexual activity: Not on file

## 2016-04-18 ENCOUNTER — Ambulatory Visit (INDEPENDENT_AMBULATORY_CARE_PROVIDER_SITE_OTHER): Payer: Medicare Other | Admitting: Licensed Clinical Social Worker

## 2016-04-18 DIAGNOSIS — F3341 Major depressive disorder, recurrent, in partial remission: Secondary | ICD-10-CM

## 2016-04-25 ENCOUNTER — Ambulatory Visit (INDEPENDENT_AMBULATORY_CARE_PROVIDER_SITE_OTHER): Payer: Medicare Other | Admitting: Licensed Clinical Social Worker

## 2016-04-25 DIAGNOSIS — F3341 Major depressive disorder, recurrent, in partial remission: Secondary | ICD-10-CM | POA: Diagnosis not present

## 2016-05-14 ENCOUNTER — Telehealth: Payer: Self-pay | Admitting: Obstetrics & Gynecology

## 2016-05-14 NOTE — Telephone Encounter (Signed)
Spoke with patient. Patient states she sees Dr. Noah Delaine for primary care. Patient states he is currently out on medical leave, unable to see currently. Patient states she had an appointment with him in December 2017 that she had to cancel due to conflicting appt with heart doctor. Patient states he would not refill medications until being seen, now has appt for January 2018. Patient states she has a lot of personal things that she would like to explain to Dr. Sabra Heck and feels this cant be explained to nurse. Patient states she has been in bed sick with the flu for the past week and can't get any antibiotics from primary care d/t doctor being out. Patient tearful and having difficulty explaining situation. RN asked if there was another provider in the office that can see her while on leave or a urgent care/ED close by that she can go to? Patient states no, she would like to talk to Dr. Sabra Heck. Advised patient Dr. Sabra Heck is seeing patients, response may not be immediate, patient is agreeable.  Dr. Sabra Heck -patient is asking for return call, please advise?

## 2016-05-14 NOTE — Telephone Encounter (Signed)
Patient wants to speak with Dr. Sabra Heck no information given.

## 2016-05-14 NOTE — Telephone Encounter (Signed)
Patient returning call. Patient states she returned call to her pcp to see if something could be called in for "flu" since she can't go to the doctor. Patient states pcp called in z-pack and cough syrup. Patient states she does not need a return call from Dr. Sabra Heck but would like to schedule an OV with Dr. Sabra Heck to discuss some personal things. Patient states nothing urgent, she would rather not discuss with nurse. Patient request appt in February. Patient scheduled for OV 06/07/16 at 1:45pm with Dr. Sabra Heck. Patient verbalizes understanding and is agreeable to date and time.  Routing to provider for final review. Patient is agreeable to disposition. Will close encounter.

## 2016-05-17 DIAGNOSIS — F329 Major depressive disorder, single episode, unspecified: Secondary | ICD-10-CM | POA: Diagnosis not present

## 2016-05-17 DIAGNOSIS — E785 Hyperlipidemia, unspecified: Secondary | ICD-10-CM | POA: Diagnosis not present

## 2016-05-17 DIAGNOSIS — N390 Urinary tract infection, site not specified: Secondary | ICD-10-CM | POA: Diagnosis not present

## 2016-05-17 DIAGNOSIS — E039 Hypothyroidism, unspecified: Secondary | ICD-10-CM | POA: Diagnosis not present

## 2016-05-17 DIAGNOSIS — J449 Chronic obstructive pulmonary disease, unspecified: Secondary | ICD-10-CM | POA: Diagnosis not present

## 2016-05-23 ENCOUNTER — Other Ambulatory Visit: Payer: Self-pay

## 2016-05-23 ENCOUNTER — Ambulatory Visit: Payer: Medicare Other | Admitting: Licensed Clinical Social Worker

## 2016-05-30 ENCOUNTER — Other Ambulatory Visit: Payer: Medicare Other | Admitting: *Deleted

## 2016-05-30 DIAGNOSIS — E7800 Pure hypercholesterolemia, unspecified: Secondary | ICD-10-CM

## 2016-05-30 DIAGNOSIS — E032 Hypothyroidism due to medicaments and other exogenous substances: Secondary | ICD-10-CM | POA: Diagnosis not present

## 2016-05-30 DIAGNOSIS — I700 Atherosclerosis of aorta: Secondary | ICD-10-CM | POA: Diagnosis not present

## 2016-05-30 DIAGNOSIS — I2510 Atherosclerotic heart disease of native coronary artery without angina pectoris: Secondary | ICD-10-CM | POA: Diagnosis not present

## 2016-05-30 DIAGNOSIS — D709 Neutropenia, unspecified: Secondary | ICD-10-CM | POA: Diagnosis not present

## 2016-05-30 DIAGNOSIS — J449 Chronic obstructive pulmonary disease, unspecified: Secondary | ICD-10-CM | POA: Diagnosis not present

## 2016-05-31 ENCOUNTER — Encounter: Payer: Self-pay | Admitting: Internal Medicine

## 2016-05-31 LAB — LIPID PANEL
CHOLESTEROL TOTAL: 149 mg/dL (ref 100–199)
Chol/HDL Ratio: 2.9 ratio units (ref 0.0–4.4)
HDL: 52 mg/dL (ref >39)
LDL Calculated: 74 mg/dL (ref 0–99)
TRIGLYCERIDES: 117 mg/dL (ref 0–149)
VLDL CHOLESTEROL CAL: 23 mg/dL (ref 5–40)

## 2016-06-05 ENCOUNTER — Telehealth: Payer: Self-pay | Admitting: Internal Medicine

## 2016-06-05 NOTE — Telephone Encounter (Signed)
Spoke with pt states that she saw her Primary care Helayne Seminole with Dr Noland Fordyce office and they did lab work and told her she has liver issues, she states thay told her what it was call but she forgot. I do not see test results in Epic or in Care everywhere, she will call them and have them faxed for Dr [facility] review. Pt has appt scheduled in april

## 2016-06-05 NOTE — Telephone Encounter (Signed)
Looks like letter was mail to pt with results stating:LDL is very good. Continue with current treatment

## 2016-06-05 NOTE — Telephone Encounter (Signed)
Pt calling for her lab results-pls call

## 2016-06-06 ENCOUNTER — Ambulatory Visit: Payer: Medicare Other | Admitting: Licensed Clinical Social Worker

## 2016-06-07 ENCOUNTER — Ambulatory Visit: Payer: Self-pay | Admitting: Obstetrics & Gynecology

## 2016-06-27 DIAGNOSIS — D1800 Hemangioma unspecified site: Secondary | ICD-10-CM | POA: Diagnosis not present

## 2016-06-27 DIAGNOSIS — L570 Actinic keratosis: Secondary | ICD-10-CM | POA: Diagnosis not present

## 2016-07-04 ENCOUNTER — Ambulatory Visit (INDEPENDENT_AMBULATORY_CARE_PROVIDER_SITE_OTHER): Payer: Medicare Other | Admitting: Licensed Clinical Social Worker

## 2016-07-04 DIAGNOSIS — F3341 Major depressive disorder, recurrent, in partial remission: Secondary | ICD-10-CM

## 2016-07-05 ENCOUNTER — Other Ambulatory Visit: Payer: Self-pay | Admitting: Obstetrics & Gynecology

## 2016-07-05 DIAGNOSIS — Z1231 Encounter for screening mammogram for malignant neoplasm of breast: Secondary | ICD-10-CM

## 2016-07-05 DIAGNOSIS — Z853 Personal history of malignant neoplasm of breast: Secondary | ICD-10-CM

## 2016-07-12 ENCOUNTER — Ambulatory Visit: Payer: Medicare Other | Admitting: Obstetrics & Gynecology

## 2016-07-26 ENCOUNTER — Other Ambulatory Visit (HOSPITAL_COMMUNITY)
Admission: RE | Admit: 2016-07-26 | Discharge: 2016-07-26 | Disposition: A | Discharge status: Home / Self Care | Payer: Medicare Other | Source: Ambulatory Visit | Attending: Obstetrics & Gynecology | Admitting: Obstetrics & Gynecology

## 2016-07-26 ENCOUNTER — Encounter: Payer: Self-pay | Admitting: Obstetrics & Gynecology

## 2016-07-26 ENCOUNTER — Ambulatory Visit (INDEPENDENT_AMBULATORY_CARE_PROVIDER_SITE_OTHER): Payer: Medicare Other | Admitting: Obstetrics & Gynecology

## 2016-07-26 VITALS — BP 112/70 | HR 58 | Resp 12 | Ht 61.0 in | Wt 138.4 lb

## 2016-07-26 DIAGNOSIS — R635 Abnormal weight gain: Secondary | ICD-10-CM

## 2016-07-26 DIAGNOSIS — R102 Pelvic and perineal pain: Secondary | ICD-10-CM

## 2016-07-26 DIAGNOSIS — N898 Other specified noninflammatory disorders of vagina: Secondary | ICD-10-CM | POA: Insufficient documentation

## 2016-07-26 DIAGNOSIS — R3129 Other microscopic hematuria: Secondary | ICD-10-CM | POA: Diagnosis not present

## 2016-07-26 DIAGNOSIS — Z202 Contact with and (suspected) exposure to infections with a predominantly sexual mode of transmission: Secondary | ICD-10-CM | POA: Diagnosis not present

## 2016-07-26 DIAGNOSIS — F4320 Adjustment disorder, unspecified: Secondary | ICD-10-CM | POA: Diagnosis not present

## 2016-07-26 DIAGNOSIS — F4321 Adjustment disorder with depressed mood: Secondary | ICD-10-CM

## 2016-07-26 LAB — POCT URINALYSIS DIPSTICK
Bilirubin, UA: NEGATIVE
Glucose, UA: NEGATIVE
Ketones, UA: NEGATIVE
LEUKOCYTES UA: NEGATIVE
Nitrite, UA: NEGATIVE
PROTEIN UA: NEGATIVE
Urobilinogen, UA: NEGATIVE (ref ?–2.0)
pH, UA: 8 (ref 5.0–8.0)

## 2016-07-26 MED ORDER — NITROFURANTOIN MONOHYD MACRO 100 MG PO CAPS: 100.0000 mg | ORAL_CAPSULE | Freq: Two times a day (BID) | ORAL | 0 refills | Status: DC

## 2016-07-26 NOTE — Progress Notes (Signed)
GYNECOLOGY  VISIT   HPI: 76 y.o. G43P0000 Married Caucasian female here for several complaints.  First, she is having increased urinary urgency and has noted a change in her urine odor and color.  Thinks she may have a UTI.  Reports husband desires to have sexual intercourse every day and she thinks this is contributing to her UTIs.  Seems to have one or two, at least, every year.  Would like STD testing today.  Does have hx of condyloma.  Pt reports her mother passed last October.  Has been very sad time for her with adjusting as she was doing direct care-giving.  She is "doing fine" and doesn't need anything for grief.  Frustrated with her sister who will not sell her mother's home.  Niece is now going to be moving into the home and will pay insurance and utilities so at least pt will not have extra costs with this.  She reports when she saw Helayne Seminole, NP, at Dr. Noland Fordyce office, she was told her liver enzymes were elevated.  This was around the same time she had cholesterol testing and liver enzymes with Dr. Debara Pickett.  These were normal.  Pt has follow up scheduled in April.  Follow up cholesterol was much improved after starting medication.  Pt frustrated with weight.  Wants to start weight loss medication.  Declined giving this today due to age and risks.  Pt voices understanding.    GYNECOLOGIC HISTORY: No LMP recorded. Patient has had a hysterectomy. Contraception: hysterectomy Menopausal hormone therapy: none, h/o DVT  Patient Active Problem List   Diagnosis Date Noted  . ASCVD (arteriosclerotic cardiovascular disease) 02/08/2016  . Claudication ([place]) 01/04/2016  . Dyslipidemia 01/04/2016  . Calcification of coronary artery 01/04/2016  . Chest pain, unspecified 01/04/2016  . Cystocele 06/06/2014  . Acid reflux 03/31/2014  . H/O malignant neoplasm of breast 03/31/2014  . H/O infectious disease 03/31/2014  . HLD (hyperlipidemia) 03/31/2014  . OP (osteoporosis) 03/31/2014  . Smoker  03/31/2014  . MRSA (methicillin resistant staph aureus) culture positive 12/04/2013  . History of DVT (deep vein thrombosis) 05/18/2013  . Cataract 01/20/2013  . 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 Urge incontinence 07/11/2011    Past Medical History:  Diagnosis Date  . Cancer ([place]) 3/07   right breast/ER/PR positive  . Cataract    and dry eye/spots on retina  . Cataract    surgery on right eye 04/12/14  . Condyloma 01/1998  . Depression   . Grave's disease   . Macular degeneration of both eyes   . Microhematuria 07/1992   negative IVP, [place]  . MRSA (methicillin resistant Staphylococcus aureus)   . Shingles     Past Surgical History:  Procedure Laterality Date  . ABDOMINAL HYSTERECTOMY    . BREAST BIOPSY  1990   left  . CATARACT EXTRACTION  04/12/14   right eye  . CATARACT EXTRACTION  2/16   left eye  . CHOLECYSTECTOMY    . EYE SURGERY    . goiter  7/93   radioactive iodine  . INGUINAL HERNIA REPAIR  09/29/2011   Procedure: HERNIA REPAIR INGUINAL ADULT;  Surgeon: Rolm Bookbinder, MD;  Location: [facility];  Service: General;  Laterality: Right;  . TUBAL LIGATION     D&C    MEDS:  Reviewed in EPIC and UTD  ALLERGIES: Gabapentin and Sulfa antibiotics  Family History  Problem Relation Age of Onset  . Hypertension Mother   . Heart disease Mother  SH:  smoker  Review of Systems  Constitutional: Negative.   Respiratory: Negative.   Cardiovascular: Negative.   Gastrointestinal: Negative.   Genitourinary: Positive for dysuria, frequency and urgency. Negative for hematuria.  Psychiatric/Behavioral: Negative.     PHYSICAL EXAMINATION:    BP 112/70 (BP Location: Right Arm, Patient Position: Sitting, Cuff Size: Small)   Pulse (!) 58   Resp 12   Ht 5\' 1"  (1.549 m)   Wt 138 lb 6.4 oz (62.8 kg)   BMI 26.15 kg/m     General appearance: alert, cooperative and appears stated age CV:  Regular rate and rhythm Lungs:  clear to auscultation, no wheezes, rales or rhonchi,  symmetric air entry Abdomen: soft, non-tender; bowel sounds normal; no masses,  no organomegaly  Pelvic: External genitalia:  no lesions              Urethra:  normal appearing urethra with no masses, tenderness or lesions              Bartholins and Skenes: normal                 Vagina: normal appearing vagina with normal color and discharge, no lesions              Cervix: no lesions              Bimanual Exam:  Uterus:  uterus absent              Adnexa: no mass, fullness, tenderness              Anus:  no lesions  Chaperone was present for exam.  Assessment: Microscopic hematuria with associated dysuria, urgency, and frequency H/O MRSA + urine culture Possible STD exposure with hx of vulvar condylom Grief reaction Weight gain Elevated lipids, improved with treatment  Plan: Urine culture pending macrobid 100mg  bid x 7 days GC/CHL, trich obtained Declined giving medication for weight loss Has cardiology follow up in April

## 2016-07-27 LAB — CYTOLOGY - PAP
CHLAMYDIA, DNA PROBE: NEGATIVE
Diagnosis: NEGATIVE
Neisseria Gonorrhea: NEGATIVE
Trichomonas: NEGATIVE

## 2016-07-28 LAB — URINE CULTURE: Organism ID, Bacteria: NO GROWTH

## 2016-07-30 ENCOUNTER — Telehealth: Payer: Self-pay | Admitting: Obstetrics & Gynecology

## 2016-07-30 NOTE — Telephone Encounter (Signed)
-----   Message from Megan Salon, MD sent at 07/29/2016  8:13 AM EDT ----- Please notify pt that her urine culture was negative.  As well, her GC/Chl, trichomonas was negative.  Please get and update on how she is feeling.  Thanks.

## 2016-07-30 NOTE — Telephone Encounter (Signed)
Call to patient. Patient states she will call Wednesday or Thursday if her symptoms have not resolved. Patient also states, "I didn't tell you this earlier on the phone, but my sister said I should have." Patient states that the second day she took the antibiotic she had hallucinations. States, "I was calling people on the phone and I wasn't actually on the phone." She states she also went to her purse this morning to get her medicines and they were in the trash. RN advise she would send this message to Dr. Sabra Heck for review and our office would call with additional recommendations.   Routing to provider for review.

## 2016-07-30 NOTE — Telephone Encounter (Signed)
Please have her stop the antibiotics and return for a cath u/a.  I will repeat the culture and micro but not restart antibiotics unless the culture is positive.  Thanks.

## 2016-07-30 NOTE — Telephone Encounter (Signed)
Call to patient. Message given to patient from Dr. Sabra Heck as seen below. Patient aware to stop antibiotics. Office visit for cath u/a scheduled for Thursday 08/02/16 at 1345. Patient agreeable to date and time of appointment.   Routing to provider for final review. Patient agreeable to disposition. Will close encounter.

## 2016-07-30 NOTE — Telephone Encounter (Signed)
Patient would like to know the results of her annual exam

## 2016-07-30 NOTE — Telephone Encounter (Signed)
Call to patient. Results reviewed with patient as seen below from Dr. Sabra Heck. Patient verbalized understanding. Patient states she is on day 4 of the antibiotics. She states her urine is still dark in color, she is still hurting a little when she goes to the bathroom, but that overall she can tell an improvement in how she feels. Patient asking how long she needs to be on antibiotic- RN advised Dr. Sabra Heck wrote prescription for 7 days. Patient verbalized understanding and aware to complete full course of antibiotics.   Routing to provider for final review. Patient agreeable to disposition. Will close encounter.

## 2016-07-30 NOTE — Telephone Encounter (Signed)
Can you please ask her to call Wednesday or Thursday morning if symptoms are not fully resolved as we are closed on Friday?  Thanks.

## 2016-07-30 NOTE — Telephone Encounter (Signed)
Left message to call Sharee Pimple at [phone].   Notes recorded by Megan Salon, MD on 07/29/2016 at 8:13 AM EDT Please notify pt that her urine culture was negative. As well, her GC/Chl, trichomonas was negative. Please get and update on how she is feeling. Thanks.

## 2016-08-01 ENCOUNTER — Ambulatory Visit: Payer: Medicare Other | Admitting: Licensed Clinical Social Worker

## 2016-08-02 ENCOUNTER — Encounter: Payer: Self-pay | Admitting: Obstetrics & Gynecology

## 2016-08-02 ENCOUNTER — Ambulatory Visit (INDEPENDENT_AMBULATORY_CARE_PROVIDER_SITE_OTHER): Payer: Medicare Other | Admitting: Obstetrics & Gynecology

## 2016-08-02 VITALS — BP 100/62 | HR 64 | Temp 98.3°F | Resp 16 | Ht 61.0 in | Wt 135.0 lb

## 2016-08-02 DIAGNOSIS — N3001 Acute cystitis with hematuria: Secondary | ICD-10-CM | POA: Diagnosis not present

## 2016-08-02 LAB — CBC
HCT: 41.6 % (ref 35.0–45.0)
HEMOGLOBIN: 14.2 g/dL (ref 11.7–15.5)
MCH: 31.1 pg (ref 27.0–33.0)
MCHC: 34.1 g/dL (ref 32.0–36.0)
MCV: 91 fL (ref 80.0–100.0)
MPV: 8.9 fL (ref 7.5–12.5)
PLATELETS: 247 10*3/uL (ref 140–400)
RBC: 4.57 MIL/uL (ref 3.80–5.10)
RDW: 13.3 % (ref 11.0–15.0)
WBC: 5.6 10*3/uL (ref 3.8–10.8)

## 2016-08-02 NOTE — Progress Notes (Signed)
GYNECOLOGY  VISIT   HPI: 75 y.o. G17P0000 Married Caucasian female here for continued pelvic pain and symptoms that she feels are a urinary tract infection.  Pt reports she continues to have some dysuria and RLQ pain.  She was started on macrobid.  Culture was negative however 3+ RBCs noted.  She feels like she's had a low grade temp at times but hasn't taken it.  Denies constipation.  Pt has CT 7/17 due to RLQ that showed constipation only.  Pt feels this pain is different.  With the macrobid, she started having hallucinations so called.  She was advised to stop and follow up for cath u/a to assess for RBCs as well as repeat the culture.    GYNECOLOGIC HISTORY: No LMP recorded. Patient has had a hysterectomy. Contraception: hysterectomy Menopausal hormone therapy: none  Patient Active Problem List   Diagnosis Date Noted  . ASCVD (arteriosclerotic cardiovascular disease) 02/08/2016  . Claudication ([place]) 01/04/2016  . Dyslipidemia 01/04/2016  . Calcification of coronary artery 01/04/2016  . Chest pain, unspecified 01/04/2016  . Cystocele 06/06/2014  . Acid reflux 03/31/2014  . H/O malignant neoplasm of breast 03/31/2014  . H/O infectious disease 03/31/2014  . HLD (hyperlipidemia) 03/31/2014  . OP (osteoporosis) 03/31/2014  . Smoker 03/31/2014  . MRSA (methicillin resistant staph aureus) culture positive 12/04/2013  . History of DVT (deep vein thrombosis) 05/18/2013  . Cataract 01/20/2013  . 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 Urge incontinence 07/11/2011    Past Medical History:  Diagnosis Date  . Cancer ([place]) 3/07   right breast/ER/PR positive  . Cataract    and dry eye/spots on retina  . Cataract    surgery on right eye 04/12/14  . Condyloma 01/1998  . Depression   . Grave's disease   . Macular degeneration of both eyes   . Microhematuria 07/1992   negative IVP, [place]  . MRSA (methicillin resistant Staphylococcus aureus)   . Shingles     Past Surgical History:  Procedure  Laterality Date  . ABDOMINAL HYSTERECTOMY    . BREAST BIOPSY  1990   left  . CATARACT EXTRACTION  04/12/14   right eye  . CATARACT EXTRACTION  2/16   left eye  . CHOLECYSTECTOMY    . EYE SURGERY    . goiter  7/93   radioactive iodine  . INGUINAL HERNIA REPAIR  09/29/2011   Procedure: HERNIA REPAIR INGUINAL ADULT;  Surgeon: Rolm Bookbinder, MD;  Location: [facility];  Service: General;  Laterality: Right;  . TUBAL LIGATION     D&C    MEDS:  Reviewed in EPIC and UTD  ALLERGIES: Gabapentin and Sulfa antibiotics  Family History  Problem Relation Age of Onset  . Hypertension Mother   . Heart disease Mother     SH:  Married, smoker  Review of Systems  Constitutional: Positive for fever (questionable, not recorded). Negative for weight loss.  Eyes: Negative.   Cardiovascular: Negative.   Gastrointestinal: Positive for abdominal pain (right lower quadrant).  Genitourinary: Positive for dysuria, frequency and urgency.    PHYSICAL EXAMINATION:    BP 100/62   Pulse 64   Temp 98.3 F (36.8 C) (Oral)   Resp 16   Ht 5\' 1"  (1.549 m)   Wt 135 lb (61.2 kg)   BMI 25.51 kg/m     General appearance: alert, cooperative and appears stated age CV:  Regular rate and rhythm Lungs:  clear to auscultation, no wheezes, rales or rhonchi, symmetric air entry Abdomen: soft,  mild RLQ and suprapubic tenderness to palpation, no rebound or guarding; bowel sounds normal; no masses,  no organomegaly  Pelvic: External genitalia:  no lesions              Urethra:  normal appearing urethra with no masses, tenderness or lesions              Bartholins and Skenes: normal                 Vagina: normal appearing vagina with normal color and discharge, no lesions              Cervix: absent              Bimanual Exam:  Uterus:  uterus absent              Adnexa: no mass, fullness, tenderness              Anus: no lesions  Procedure:  Using sterile technique, cath u/a obtained without  difficulty.  Chaperone was present for exam.  Assessment: RLQ pain and suprapubic pain Microscopic hematuria at last OV  Plan: Cath u/a performed today.  Urine micro and culture will be obtained.  Results will be called to pt.

## 2016-08-03 LAB — URINALYSIS, MICROSCOPIC ONLY
Bacteria, UA: NONE SEEN [HPF]
CASTS: NONE SEEN [LPF]
RBC / HPF: NONE SEEN RBC/HPF (ref <=2)
Squamous Epithelial / LPF: NONE SEEN [HPF] (ref <=5)
WBC, UA: NONE SEEN WBC/HPF (ref <=5)
Yeast: NONE SEEN [HPF]

## 2016-08-05 LAB — URINE CULTURE: Organism ID, Bacteria: NO GROWTH

## 2016-08-06 ENCOUNTER — Telehealth: Payer: Self-pay | Admitting: Obstetrics & Gynecology

## 2016-08-06 NOTE — Telephone Encounter (Signed)
Routed results note to triage pool.  Her CBC was normal, urine micro was negative for blood and urine culture was negative. I do not know cause of pain.  She did have constipation last year on CT.  Consider increasing stool softeners like adding benefiber daily.  May need to follow up with PCP.  Thanks.

## 2016-08-06 NOTE — Telephone Encounter (Signed)
Routing to [place] for review and advise of results from 08/02/2016.

## 2016-08-06 NOTE — Telephone Encounter (Signed)
Spoke with patient. Advised of message as seen below from Dr.Miller. Patient is agreeable and verbalizes understanding.  Routing to provider for final review. Patient agreeable to disposition. Will close encounter   

## 2016-08-06 NOTE — Telephone Encounter (Signed)
Patient checking if any of her test results are back yet.

## 2016-08-15 ENCOUNTER — Encounter: Payer: Self-pay | Admitting: Internal Medicine

## 2016-08-15 ENCOUNTER — Ambulatory Visit (INDEPENDENT_AMBULATORY_CARE_PROVIDER_SITE_OTHER): Payer: Medicare Other | Admitting: Internal Medicine

## 2016-08-15 ENCOUNTER — Ambulatory Visit
Admission: RE | Admit: 2016-08-15 | Discharge: 2016-08-15 | Disposition: A | Discharge status: Home / Self Care | Payer: Medicare Other | Source: Ambulatory Visit | Attending: Obstetrics & Gynecology | Admitting: Obstetrics & Gynecology

## 2016-08-15 ENCOUNTER — Ambulatory Visit: Payer: Self-pay

## 2016-08-15 VITALS — BP 96/60 | HR 59 | Ht 61.0 in | Wt 135.8 lb

## 2016-08-15 DIAGNOSIS — I2510 Atherosclerotic heart disease of native coronary artery without angina pectoris: Secondary | ICD-10-CM

## 2016-08-15 DIAGNOSIS — Z853 Personal history of malignant neoplasm of breast: Secondary | ICD-10-CM

## 2016-08-15 DIAGNOSIS — R002 Palpitations: Secondary | ICD-10-CM

## 2016-08-15 DIAGNOSIS — E7849 Other hyperlipidemia: Secondary | ICD-10-CM

## 2016-08-15 DIAGNOSIS — I2584 Coronary atherosclerosis due to calcified coronary lesion: Secondary | ICD-10-CM | POA: Diagnosis not present

## 2016-08-15 DIAGNOSIS — Z1231 Encounter for screening mammogram for malignant neoplasm of breast: Secondary | ICD-10-CM | POA: Diagnosis not present

## 2016-08-15 DIAGNOSIS — E784 Other hyperlipidemia: Secondary | ICD-10-CM

## 2016-08-15 NOTE — Patient Instructions (Addendum)
Your physician has recommended that you wear an event monitor for ONE WEEK @ [address]. Event monitors are medical devices that record the heart's electrical activity. Doctors most often [place] these monitors to diagnose arrhythmias. Arrhythmias are problems with the speed or rhythm of the heartbeat. The monitor is a small, portable device. You can wear one while you do your normal daily activities. This is usually used to diagnose what is causing palpitations/syncope (passing out).  Your physician has recommended you make the following change in your medication:  -- HOLD atorvastatin (lipitor) for ONE MONTH   Your physician recommends that you schedule a follow-up appointment after your monitor.

## 2016-08-15 NOTE — Progress Notes (Signed)
OFFICE NOTE  Chief Complaint:  Waves in her stomach/chest  Primary Care Physician: Thressa Sheller, MD  HPI:  Victoria Sims is a 76 y.o. female who is kindly referred to me for evaluation and management of recent statin intolerance. She's had problems with her digestive tract for some time and was placed on a statin recently due to some abnormal findings on a CT scan. This was a screening CT scan of her lungs as she is a smoker. It did not demonstrate any lung masses but did demonstrate coronary artery calcification. She was placed on statin therapy but to my knowledge has not had a cholesterol profile recently. She noted that the statins cause worsening constipation and stopped the medication. In addition she was sent to Dr. Irven Shelling office for an echocardiogram. This demonstrated a low normal EF of 50-55% with reportedly normal diastolic function, and trace tricuspid regurgitation with mild pulmonic regurgitation. 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 Her energy level has decreased as well. She denies any frank chest pain but does get short of breath doing certain activities. She also reports pain in her calf when walking that gets better at rest. This sometimes limits her walking long distances.  02/08/2016  [name] returns today for follow-up. She underwent a recent lipid profile in August 2017 which showed total cholesterol 285, LDL 208 and a very high LDL particle number of 20-21. April be level is 150 and LPa was elevated at 185. This indicates a marked mixed dyslipidemia. Unfortunately she had been intolerant to statins. She is currently on pravastatin 80 however I do not think this is likely to get her to her goal LDL. She also underwent stress testing which was negative for ischemia and lower extremity arterial Dopplers which showed normal ABIs and no evidence of abdominal aortic  aneurysm given her history of smoking.  08/15/2016  [name] returns for follow-up. She reports she been having some waves of symptoms a started in her stomach and go up into her chest. She does not think his reflux and does not describe it as pain or pressure. She says is very short-lived and can only go for a few seconds. She says it catches her reading and takes it away. She sometimes feels like her heart may race and I wonder if these are actually palpitations or perhaps PVCs. Nothing is noted on EKG today. She's had marked improvement in cholesterol on Lipitor 80 with LDL of 74, however she reports recent recurrent UTIs. She said after starting the medication she had foul-smelling urine has been treated with a number of antibiotics. No clear sources been found. Interestingly, this is listed as side effect of Lipitor although it was fairly uncommon (<1%).   PMHx:  Past Medical History:  Diagnosis Date  . Cancer ([place]) 3/07   right breast/ER/PR positive  . Cataract    and dry eye/spots on retina  . Cataract    surgery on right eye 04/12/14  . Condyloma 01/1998  . Depression   . Grave's disease   . Macular degeneration of both eyes   . Microhematuria 07/1992   negative IVP, [place]  . MRSA (methicillin resistant Staphylococcus aureus)   . Shingles     Past Surgical History:  Procedure Laterality Date  . ABDOMINAL HYSTERECTOMY    . BREAST BIOPSY  1990   left  . CATARACT EXTRACTION  04/12/14   right  eye  . CATARACT EXTRACTION  2/16   left eye  . CHOLECYSTECTOMY    . EYE SURGERY    . goiter  7/93   radioactive iodine  . INGUINAL HERNIA REPAIR  09/29/2011   Procedure: HERNIA REPAIR INGUINAL ADULT;  Surgeon: Rolm Bookbinder, MD;  Location: [facility];  Service: General;  Laterality: Right;  . TUBAL LIGATION     D&C    FAMHx:  Family History  Problem Relation Age of Onset  . Hypertension Mother   . Heart disease Mother     SOCHx:   reports that she has been smoking Cigarettes.   She has a 27.00 pack-year smoking history. She has never used smokeless tobacco. She reports that she does not drink alcohol or use drugs.  ALLERGIES:  Allergies  Allergen Reactions  . Gabapentin Nausea And Vomiting  . Macrobid [Nitrofurantoin Macrocrystal] Other (See Comments)    hallucination  . Sulfa Antibiotics Rash    ROS: Pertinent items noted in HPI and remainder of comprehensive ROS otherwise negative.  HOME MEDS: Current Outpatient Prescriptions  Medication Sig Dispense Refill  . ALPRAZolam (XANAX) 1 MG tablet Take 1 tablet (1 mg total) by mouth at bedtime as needed for sleep. 30 tablet 1  . aspirin 81 MG EC tablet Take 81 mg by mouth daily.  99  . atorvastatin (LIPITOR) 80 MG tablet     . Calcium-Vitamin D (CALTRATE 600 PLUS-VIT D PO) Take 1 tablet by mouth daily.    . Cholecalciferol (D3-1000) 1000 UNITS capsule Take 1,000 Units by mouth daily.    Marland Kitchen CLONAZEPAM PO Take by mouth as needed.    Marland Kitchen escitalopram (LEXAPRO) 20 MG tablet Take 40 mg by mouth daily.    . furosemide (LASIX) 40 MG tablet Take 40 mg by mouth daily.    Marland Kitchen HYDROcodone-acetaminophen (NORCO/VICODIN) 5-325 MG tablet Take 1 tablet by mouth every 6 (six) hours as needed for moderate pain. 30 tablet 0  . levothyroxine (SYNTHROID, LEVOTHROID) 100 MCG tablet Take 100 mcg by mouth daily.    . Multiple Vitamins-Minerals (PRESERVISION AREDS PO) Take by mouth daily. Reported on 09/29/2015    . mupirocin ointment (BACTROBAN) 2 % APPLY 1 APPLICATION INTO NOSE TWICE DAILY 22 g 0  . pantoprazole (PROTONIX) 40 MG tablet Take 40 mg by mouth daily.    . potassium chloride (K-DUR) 10 MEQ tablet Take 10 mEq by mouth daily. Take 1 tablet on Monday, Tuesday, and Wednesday only. Do not take Thursday, Friday, Saturday, Sunday    . Sennosides-Docusate Sodium (CVS SENNA-C PLUS PO) Take by mouth.    . traZODone (DESYREL) 50 MG tablet Take 1 tablet (50 mg total) by mouth at bedtime as needed for sleep. 30 tablet 0   No current  facility-administered medications for this visit.     LABS/IMAGING: No results found for this or any previous visit (from the past 48 hour(s)). No results found.  WEIGHTS: Wt Readings from Last 3 Encounters:  08/15/16 135 lb 12.8 oz (61.6 kg)  08/02/16 135 lb (61.2 kg)  07/26/16 138 lb 6.4 oz (62.8 kg)    VITALS: BP 96/60   Pulse (!) 59   Ht 5\' 1"  (1.549 m)   Wt 135 lb 12.8 oz (61.6 kg)   BMI 25.66 kg/m   EXAM: General appearance: alert and no distress Lungs: clear to auscultation bilaterally Heart: regular rate and rhythm, S1, S2 normal, no murmur, click, rub or gallop Extremities: extremities normal, atraumatic, no cyanosis or edema Neurologic: Grossly  normal  EKG: Sinus bradycardia at 59, LAFB, nonspecific T wave changes  ASSESSMENT: 1. Palpitations 2. Persistent fatigue and dyspnea 3. Multivessel coronary artery calcium by CT 4. Dyslipidemia-improved on lipitor 5. Low normal LVEF of 50% by echo in June 2017 6. Claudication  PLAN: 1.   Mrs. Kary has had marked improvement in her lipid profile on atorvastatin, but recently has been having some UTIs. There could be association with Lipitor as is listed as a side effect although this is exceedingly uncommon. I advised discontinuing her Lipitor for one month to see for UTIs improve. If she is successful then we would recommend restarting it to see if the symptoms come back. She is also reporting waves that started in her abdomen and go, to the chest. These are short-lived only been a few seconds and come very randomly. I don't think the reflux rather could be palpitations, perhaps PVCs or even intermittent A. fib. I like to place a one-week monitor to see if we can capture these as she has had 4-5 events over the past week. Follow-up with me afterwards.  Pixie Casino, MD, [facility] Attending Cardiologist [place] [facility] [place] 08/15/2016, 10:21 AM

## 2016-08-22 ENCOUNTER — Ambulatory Visit (INDEPENDENT_AMBULATORY_CARE_PROVIDER_SITE_OTHER): Payer: Medicare Other

## 2016-08-22 ENCOUNTER — Ambulatory Visit
Admission: RE | Admit: 2016-08-22 | Discharge: 2016-08-22 | Disposition: A | Discharge status: Home / Self Care | Payer: Medicare Other | Source: Ambulatory Visit | Attending: Acute Care | Admitting: Acute Care

## 2016-08-22 ENCOUNTER — Ambulatory Visit (INDEPENDENT_AMBULATORY_CARE_PROVIDER_SITE_OTHER): Payer: Medicare Other | Admitting: Licensed Clinical Social Worker

## 2016-08-22 DIAGNOSIS — F3341 Major depressive disorder, recurrent, in partial remission: Secondary | ICD-10-CM

## 2016-08-22 DIAGNOSIS — F17210 Nicotine dependence, cigarettes, uncomplicated: Principal | ICD-10-CM

## 2016-08-22 DIAGNOSIS — R002 Palpitations: Secondary | ICD-10-CM | POA: Diagnosis not present

## 2016-08-22 DIAGNOSIS — E785 Hyperlipidemia, unspecified: Secondary | ICD-10-CM | POA: Diagnosis not present

## 2016-08-22 DIAGNOSIS — E032 Hypothyroidism due to medicaments and other exogenous substances: Secondary | ICD-10-CM | POA: Diagnosis not present

## 2016-08-29 ENCOUNTER — Ambulatory Visit: Payer: Self-pay

## 2016-08-29 DIAGNOSIS — E785 Hyperlipidemia, unspecified: Secondary | ICD-10-CM | POA: Diagnosis not present

## 2016-08-29 DIAGNOSIS — N183 Chronic kidney disease, stage 3 (moderate): Secondary | ICD-10-CM | POA: Diagnosis not present

## 2016-08-29 DIAGNOSIS — I2510 Atherosclerotic heart disease of native coronary artery without angina pectoris: Secondary | ICD-10-CM | POA: Diagnosis not present

## 2016-08-29 DIAGNOSIS — F339 Major depressive disorder, recurrent, unspecified: Secondary | ICD-10-CM | POA: Diagnosis not present

## 2016-08-31 ENCOUNTER — Other Ambulatory Visit: Payer: Self-pay | Admitting: Acute Care

## 2016-08-31 DIAGNOSIS — F17210 Nicotine dependence, cigarettes, uncomplicated: Secondary | ICD-10-CM

## 2016-09-03 ENCOUNTER — Telehealth: Payer: Self-pay | Admitting: Internal Medicine

## 2016-09-03 NOTE — Telephone Encounter (Signed)
New message      Patient calling for Holter monitor test results.

## 2016-09-03 NOTE — Telephone Encounter (Signed)
Patient notified of results. Reminded of 09/19/16 appt  Pixie Casino, MD 09/03/2016 Routine  Narrative & Impression    Predominant sinus bradycardia - PAC's. No significant pauses. Average HR 56. No a-fib.

## 2016-09-04 ENCOUNTER — Other Ambulatory Visit: Payer: Self-pay | Admitting: Internal Medicine

## 2016-09-04 DIAGNOSIS — R1031 Right lower quadrant pain: Secondary | ICD-10-CM

## 2016-09-05 ENCOUNTER — Ambulatory Visit (INDEPENDENT_AMBULATORY_CARE_PROVIDER_SITE_OTHER): Payer: Medicare Other | Admitting: Licensed Clinical Social Worker

## 2016-09-05 DIAGNOSIS — F3341 Major depressive disorder, recurrent, in partial remission: Secondary | ICD-10-CM

## 2016-09-12 ENCOUNTER — Ambulatory Visit: Payer: Self-pay

## 2016-09-12 ENCOUNTER — Ambulatory Visit
Admission: RE | Admit: 2016-09-12 | Discharge: 2016-09-12 | Disposition: A | Discharge status: Home / Self Care | Payer: Medicare Other | Source: Ambulatory Visit | Attending: Internal Medicine | Admitting: Internal Medicine

## 2016-09-12 DIAGNOSIS — R102 Pelvic and perineal pain: Secondary | ICD-10-CM | POA: Diagnosis not present

## 2016-09-12 DIAGNOSIS — R1031 Right lower quadrant pain: Secondary | ICD-10-CM

## 2016-09-19 ENCOUNTER — Ambulatory Visit (INDEPENDENT_AMBULATORY_CARE_PROVIDER_SITE_OTHER): Payer: Medicare Other | Admitting: Internal Medicine

## 2016-09-19 VITALS — BP 96/64 | HR 57 | Ht 61.0 in | Wt 135.0 lb

## 2016-09-19 DIAGNOSIS — R002 Palpitations: Secondary | ICD-10-CM | POA: Diagnosis not present

## 2016-09-19 DIAGNOSIS — E785 Hyperlipidemia, unspecified: Secondary | ICD-10-CM

## 2016-09-19 DIAGNOSIS — I2510 Atherosclerotic heart disease of native coronary artery without angina pectoris: Secondary | ICD-10-CM

## 2016-09-19 DIAGNOSIS — R0789 Other chest pain: Secondary | ICD-10-CM | POA: Diagnosis not present

## 2016-09-19 MED ORDER — ROSUVASTATIN CALCIUM 20 MG PO TABS: 20.0000 mg | ORAL_TABLET | Freq: Every day | ORAL | 3 refills | Status: DC

## 2016-09-19 NOTE — Progress Notes (Signed)
OFFICE NOTE  Chief Complaint:  Still having palpitations / waves from her stomach  Primary Care Physician: Thressa Sheller, MD  HPI:  Victoria Sims is a 76 y.o. female who is kindly referred to me for evaluation and management of recent statin intolerance. She's had problems with her digestive tract for some time and was placed on a statin recently due to some abnormal findings on a CT scan. This was a screening CT scan of her lungs as she is a smoker. It did not demonstrate any lung masses but did demonstrate coronary artery calcification. She was placed on statin therapy but to my knowledge has not had a cholesterol profile recently. She noted that the statins cause worsening constipation and stopped the medication. In addition she was sent to Dr. Irven Shelling office for an echocardiogram. This demonstrated a low normal EF of 50-55% with reportedly normal diastolic function, and trace tricuspid regurgitation with mild pulmonic regurgitation. 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 Her energy level has decreased as well. She denies any frank chest pain but does get short of breath doing certain activities. She also reports pain in her calf when walking that gets better at rest. This sometimes limits her walking long distances.  02/08/2016  Victoria Sims returns today for follow-up. She underwent a recent lipid profile in August 2017 which showed total cholesterol 285, LDL 208 and a very high LDL particle number of 20-21. April be level is 150 and LPa was elevated at 185. This indicates a marked mixed dyslipidemia. Unfortunately she had been intolerant to statins. She is currently on pravastatin 80 however I do not think this is likely to get her to her goal LDL. She also underwent stress testing which was negative for ischemia and lower extremity arterial Dopplers which showed normal ABIs and no evidence  of abdominal aortic aneurysm given her history of smoking.  08/15/2016  Victoria Sims returns for follow-up. She reports she been having some waves of symptoms a started in her stomach and go up into her chest. She does not think his reflux and does not describe it as pain or pressure. She says is very short-lived and can only go for a few seconds. She says it catches her reading and takes it away. She sometimes feels like her heart may race and I wonder if these are actually palpitations or perhaps PVCs. Nothing is noted on EKG today. She's had marked improvement in cholesterol on Lipitor 80 with LDL of 74, however she reports recent recurrent UTIs. She said after starting the medication she had foul-smelling urine has been treated with a number of antibiotics. No clear sources been found. Interestingly, this is listed as side effect of Lipitor although it was fairly uncommon (<1%).  09/19/2016  Victoria Sims was seen today in follow-up. She reports she still having waves started her stomach and come up to her throat. I don't believe the palpitations and she has these all throughout the day. While wearing a monitor she was noted to have some PACs but they were not that frequent. There is no evidence of A. fib or any other concerning arrhythmias. I do not think the symptoms are related to arrhythmia. It could be related to GI dysmotility, esophageal spasm or perhaps reflux, or or more likely anxiety which she seems to struggle with. I apprised her to consider trying the Xanax when she feels these episodes  come on. With regards to her UTIs, they have not returned off of Lipitor. Interestingly this is a listed side effect although very unlikely. Previously she was on pravastatin however with multivessel coronary artery calcification and other risk factors for goal LDL-C was less than 70 and her starting LDL was 208 therefore she was thought not to be able to reach goal based on that medication. Since she is now  intolerant of atorvastatin, I would recommend we try rosuvastatin. We'll initially start at the lowest "high potency" dose - 20 mg daily.    PMHx:  Past Medical History:  Diagnosis Date  . Cancer ([place]) 3/07   right breast/ER/PR positive  . Cataract    and dry eye/spots on retina  . Cataract    surgery on right eye 04/12/14  . Condyloma 01/1998  . Depression   . Grave's disease   . Macular degeneration of both eyes   . Microhematuria 07/1992   negative IVP, [place]  . MRSA (methicillin resistant Staphylococcus aureus)   . Shingles     Past Surgical History:  Procedure Laterality Date  . ABDOMINAL HYSTERECTOMY    . BREAST BIOPSY  1990   left  . CATARACT EXTRACTION  04/12/14   right eye  . CATARACT EXTRACTION  2/16   left eye  . CHOLECYSTECTOMY    . EYE SURGERY    . goiter  7/93   radioactive iodine  . INGUINAL HERNIA REPAIR  09/29/2011   Procedure: HERNIA REPAIR INGUINAL ADULT;  Surgeon: Rolm Bookbinder, MD;  Location: [facility];  Service: General;  Laterality: Right;  . TUBAL LIGATION     D&C    FAMHx:  Family History  Problem Relation Age of Onset  . Hypertension Mother   . Heart disease Mother     SOCHx:   reports that she has been smoking Cigarettes.  She has a 27.00 pack-year smoking history. She has never used smokeless tobacco. She reports that she does not drink alcohol or use drugs.  ALLERGIES:  Allergies  Allergen Reactions  . Atorvastatin Other (See Comments)    UTIs  . Gabapentin Nausea And Vomiting  . Macrobid [Nitrofurantoin Macrocrystal] Other (See Comments)    hallucination  . Sulfa Antibiotics Rash    ROS: A comprehensive review of systems was negative.  HOME MEDS: Current Outpatient Prescriptions  Medication Sig Dispense Refill  . ALPRAZolam (XANAX) 1 MG tablet Take 1 tablet (1 mg total) by mouth at bedtime as needed for sleep. 30 tablet 1  . aspirin 81 MG EC tablet Take 81 mg by mouth daily.  99  . Calcium-Vitamin D (CALTRATE 600 PLUS-VIT D  PO) Take 1 tablet by mouth daily.    . Cholecalciferol (D3-1000) 1000 UNITS capsule Take 1,000 Units by mouth daily.    Marland Kitchen CLONAZEPAM PO Take by mouth as needed.    Marland Kitchen escitalopram (LEXAPRO) 20 MG tablet Take 40 mg by mouth daily.    . furosemide (LASIX) 40 MG tablet Take 40 mg by mouth daily.    Marland Kitchen HYDROcodone-acetaminophen (NORCO/VICODIN) 5-325 MG tablet Take 1 tablet by mouth every 6 (six) hours as needed for moderate pain. 30 tablet 0  . levothyroxine (SYNTHROID, LEVOTHROID) 100 MCG tablet Take 100 mcg by mouth daily.    . Multiple Vitamins-Minerals (PRESERVISION AREDS PO) Take by mouth daily. Reported on 09/29/2015    . mupirocin ointment (BACTROBAN) 2 % APPLY 1 APPLICATION INTO NOSE TWICE DAILY 22 g 0  . pantoprazole (PROTONIX) 40 MG tablet Take  40 mg by mouth daily.    . potassium chloride (K-DUR) 10 MEQ tablet Take 10 mEq by mouth daily. Take 1 tablet on Monday, Tuesday, and Wednesday only. Do not take Thursday, Friday, Saturday, Sunday    . Sennosides-Docusate Sodium (CVS SENNA-C PLUS PO) Take by mouth.    . traZODone (DESYREL) 50 MG tablet Take 1 tablet (50 mg total) by mouth at bedtime as needed for sleep. 30 tablet 0  . rosuvastatin (CRESTOR) 20 MG tablet Take 1 tablet (20 mg total) by mouth daily. 90 tablet 3   No current facility-administered medications for this visit.     LABS/IMAGING: No results found for this or any previous visit (from the past 48 hour(s)). No results found.  WEIGHTS: Wt Readings from Last 3 Encounters:  09/19/16 135 lb (61.2 kg)  08/15/16 135 lb 12.8 oz (61.6 kg)  08/02/16 135 lb (61.2 kg)    VITALS: BP 96/64   Pulse (!) 57   Ht 5\' 1"  (1.549 m)   Wt 135 lb (61.2 kg)   BMI 25.51 kg/m   EXAM: Deferred  EKG: Sinus bradycardia 57  ASSESSMENT: 1. Palpitations - occasional PAC;s on monitor, do not explain symptoms 2. Persistent fatigue and dyspnea 3. Multivessel coronary artery calcium by CT 4. Dyslipidemia-improved on lipitor (but had  frequent UTI's with this, now discontinued) 5. Low normal LVEF of 50% by echo in June 2017 6. Claudication  PLAN: 1.   Victoria Sims has had symptoms of waves coming up from her stomach to her throat. This may be more GI in origin are probably related to anxiety. I do not see a cardiac cause of this. She does need aggressive risk factor modification with coronary and peripheral artery disease. Her LDL was quite high greater than 200 prior to starting on atorvastatin. Unfortunate she had UTIs which came with frequency after starting this and they have resolved after discontinuing it. Reluctantly she is agreed to try an alternative high potency statin, rosuvastatin. Will repeat a lipid profile in 2 months. Titrate accordingly. If this is not tolerated then she may be considered for PCSK9 inhibitor therapy.  Pixie Casino, MD, [facility] Attending Cardiologist [place] 09/19/2016, 10:03 AM

## 2016-09-19 NOTE — Patient Instructions (Addendum)
Your physician wants you to follow-up in: ONE YEAR with Dr. Debara Pickett. You will receive a reminder letter in the mail two months in advance. If you don't receive a letter, please call our office to schedule the follow-up appointment.  Your physician has recommended you make the following change in your medication: START crestor 20mg  once daily  Your physician recommends that you return for lab work in [place] with Dr. Debara Pickett - fasting to check cholesterol

## 2016-10-17 ENCOUNTER — Ambulatory Visit (INDEPENDENT_AMBULATORY_CARE_PROVIDER_SITE_OTHER): Payer: Medicare Other | Admitting: Licensed Clinical Social Worker

## 2016-10-17 DIAGNOSIS — F3341 Major depressive disorder, recurrent, in partial remission: Secondary | ICD-10-CM | POA: Diagnosis not present

## 2016-11-14 ENCOUNTER — Other Ambulatory Visit: Payer: Self-pay | Admitting: *Deleted

## 2016-11-14 ENCOUNTER — Ambulatory Visit (INDEPENDENT_AMBULATORY_CARE_PROVIDER_SITE_OTHER): Payer: Medicare Other | Admitting: Licensed Clinical Social Worker

## 2016-11-14 DIAGNOSIS — F3341 Major depressive disorder, recurrent, in partial remission: Secondary | ICD-10-CM | POA: Diagnosis not present

## 2016-11-14 DIAGNOSIS — E7800 Pure hypercholesterolemia, unspecified: Secondary | ICD-10-CM | POA: Diagnosis not present

## 2016-11-14 LAB — LIPID PANEL
CHOL/HDL RATIO: 2.6 ratio (ref 0.0–4.4)
Cholesterol, Total: 160 mg/dL (ref 100–199)
HDL: 62 mg/dL (ref >39)
LDL CALC: 83 mg/dL (ref 0–99)
TRIGLYCERIDES: 74 mg/dL (ref 0–149)
VLDL Cholesterol Cal: 15 mg/dL (ref 5–40)

## 2016-11-21 ENCOUNTER — Encounter: Payer: Self-pay | Admitting: Internal Medicine

## 2016-12-05 ENCOUNTER — Ambulatory Visit (INDEPENDENT_AMBULATORY_CARE_PROVIDER_SITE_OTHER): Payer: Medicare Other | Admitting: Licensed Clinical Social Worker

## 2016-12-05 DIAGNOSIS — F3341 Major depressive disorder, recurrent, in partial remission: Secondary | ICD-10-CM | POA: Diagnosis not present

## 2016-12-13 ENCOUNTER — Other Ambulatory Visit: Payer: Self-pay

## 2016-12-13 DIAGNOSIS — E785 Hyperlipidemia, unspecified: Secondary | ICD-10-CM

## 2016-12-13 MED ORDER — ROSUVASTATIN CALCIUM 20 MG PO TABS: 20.0000 mg | ORAL_TABLET | Freq: Every day | ORAL | 0 refills | Status: DC

## 2016-12-20 ENCOUNTER — Ambulatory Visit (INDEPENDENT_AMBULATORY_CARE_PROVIDER_SITE_OTHER): Payer: Medicare Other | Admitting: Licensed Clinical Social Worker

## 2016-12-20 DIAGNOSIS — N390 Urinary tract infection, site not specified: Secondary | ICD-10-CM | POA: Diagnosis not present

## 2016-12-20 DIAGNOSIS — E785 Hyperlipidemia, unspecified: Secondary | ICD-10-CM | POA: Diagnosis not present

## 2016-12-20 DIAGNOSIS — E039 Hypothyroidism, unspecified: Secondary | ICD-10-CM | POA: Diagnosis not present

## 2016-12-20 DIAGNOSIS — E559 Vitamin D deficiency, unspecified: Secondary | ICD-10-CM | POA: Diagnosis not present

## 2016-12-20 DIAGNOSIS — F3341 Major depressive disorder, recurrent, in partial remission: Secondary | ICD-10-CM

## 2016-12-20 DIAGNOSIS — N183 Chronic kidney disease, stage 3 (moderate): Secondary | ICD-10-CM | POA: Diagnosis not present

## 2017-01-02 DIAGNOSIS — D709 Neutropenia, unspecified: Secondary | ICD-10-CM | POA: Diagnosis not present

## 2017-01-02 DIAGNOSIS — J449 Chronic obstructive pulmonary disease, unspecified: Secondary | ICD-10-CM | POA: Diagnosis not present

## 2017-01-02 DIAGNOSIS — I2510 Atherosclerotic heart disease of native coronary artery without angina pectoris: Secondary | ICD-10-CM | POA: Diagnosis not present

## 2017-01-02 DIAGNOSIS — F339 Major depressive disorder, recurrent, unspecified: Secondary | ICD-10-CM | POA: Diagnosis not present

## 2017-01-03 ENCOUNTER — Ambulatory Visit (INDEPENDENT_AMBULATORY_CARE_PROVIDER_SITE_OTHER): Payer: Medicare Other | Admitting: Licensed Clinical Social Worker

## 2017-01-03 DIAGNOSIS — F3341 Major depressive disorder, recurrent, in partial remission: Secondary | ICD-10-CM

## 2017-01-24 ENCOUNTER — Ambulatory Visit (INDEPENDENT_AMBULATORY_CARE_PROVIDER_SITE_OTHER): Payer: Medicare Other | Admitting: Licensed Clinical Social Worker

## 2017-01-24 DIAGNOSIS — F3341 Major depressive disorder, recurrent, in partial remission: Secondary | ICD-10-CM | POA: Diagnosis not present

## 2017-02-07 ENCOUNTER — Ambulatory Visit (INDEPENDENT_AMBULATORY_CARE_PROVIDER_SITE_OTHER): Payer: Medicare Other | Admitting: Licensed Clinical Social Worker

## 2017-02-07 DIAGNOSIS — F3341 Major depressive disorder, recurrent, in partial remission: Secondary | ICD-10-CM

## 2017-02-27 ENCOUNTER — Ambulatory Visit
Admission: RE | Admit: 2017-02-27 | Discharge: 2017-02-27 | Disposition: A | Discharge status: Home / Self Care | Payer: Medicare Other | Source: Ambulatory Visit | Attending: Acute Care | Admitting: Acute Care

## 2017-02-27 DIAGNOSIS — F17210 Nicotine dependence, cigarettes, uncomplicated: Secondary | ICD-10-CM

## 2017-02-27 DIAGNOSIS — J439 Emphysema, unspecified: Secondary | ICD-10-CM | POA: Diagnosis not present

## 2017-02-28 ENCOUNTER — Ambulatory Visit (INDEPENDENT_AMBULATORY_CARE_PROVIDER_SITE_OTHER): Payer: Medicare Other | Admitting: Licensed Clinical Social Worker

## 2017-02-28 DIAGNOSIS — F3341 Major depressive disorder, recurrent, in partial remission: Secondary | ICD-10-CM | POA: Diagnosis not present

## 2017-03-05 ENCOUNTER — Telehealth: Payer: Self-pay | Admitting: Acute Care

## 2017-03-05 DIAGNOSIS — F17210 Nicotine dependence, cigarettes, uncomplicated: Principal | ICD-10-CM

## 2017-03-05 DIAGNOSIS — Z122 Encounter for screening for malignant neoplasm of respiratory organs: Secondary | ICD-10-CM

## 2017-03-06 ENCOUNTER — Ambulatory Visit (INDEPENDENT_AMBULATORY_CARE_PROVIDER_SITE_OTHER): Payer: Medicare Other | Admitting: Orthopaedic Surgery

## 2017-03-06 ENCOUNTER — Ambulatory Visit (INDEPENDENT_AMBULATORY_CARE_PROVIDER_SITE_OTHER): Payer: Self-pay

## 2017-03-06 DIAGNOSIS — M25511 Pain in right shoulder: Secondary | ICD-10-CM | POA: Diagnosis not present

## 2017-03-06 DIAGNOSIS — M25561 Pain in right knee: Secondary | ICD-10-CM | POA: Diagnosis not present

## 2017-03-06 DIAGNOSIS — I2510 Atherosclerotic heart disease of native coronary artery without angina pectoris: Secondary | ICD-10-CM | POA: Diagnosis not present

## 2017-03-06 DIAGNOSIS — G8929 Other chronic pain: Secondary | ICD-10-CM | POA: Diagnosis not present

## 2017-03-06 DIAGNOSIS — M25562 Pain in left knee: Secondary | ICD-10-CM

## 2017-03-06 MED ORDER — METHYLPREDNISOLONE ACETATE 40 MG/ML IJ SUSP
80.0000 mg | INTRAMUSCULAR | Status: AC | PRN
  Administered 2017-03-06: 80 mg

## 2017-03-06 MED ORDER — BUPIVACAINE HCL 0.5 % IJ SOLN
2.0000 mL | INTRAMUSCULAR | Status: AC | PRN
  Administered 2017-03-06: 2 mL via INTRA_ARTICULAR

## 2017-03-06 MED ORDER — LIDOCAINE HCL 1 % IJ SOLN
2.0000 mL | INTRAMUSCULAR | Status: AC | PRN
  Administered 2017-03-06: 2 mL

## 2017-03-06 NOTE — Progress Notes (Signed)
Office Visit Note   Patient: Victoria Sims           Date of Birth: 24-Jul-1940           MRN: [id number] Visit Date: 03/06/2017              Requested by: Thressa Sheller, [address], [place], [place] [address] PCP: Thressa Sheller, MD   Assessment & Plan: Visit Diagnoses:  1. Chronic right shoulder pain   2. Chronic pain of both knees   Impingement syndrome right shoulder-recurrent. Bilateral knee degenerative arthritis  Plan: Subacromial cortisone injection right shoulder and monitor her response. Return to office 2 weeks and consider injecting both knees for the osteoarthritis  Follow-Up Instructions: No Follow-up on file.   Orders:  Orders Placed This Encounter  Procedures  . XR KNEE 3 VIEW LEFT  . XR KNEE 3 VIEW RIGHT  . XR Shoulder Right   No orders of the defined types were placed in this encounter.     Procedures: Large Joint Inj Date/Time: 03/06/2017 11:55 AM Performed by: Garald Balding Authorized by: Garald Balding   Consent Given by:  Patient Timeout: prior to procedure the correct patient, procedure, and site was verified   Indications:  Pain Location:  Shoulder Site:  L subacromial bursa Prep: patient was prepped and draped in usual sterile fashion   Needle Size:  25 G Needle Length:  1.5 inches Approach:  Lateral Ultrasound Guidance: No   Fluoroscopic Guidance: No   Arthrogram: No   Medications:  80 mg methylPREDNISolone acetate 40 MG/ML; 2 mL lidocaine 1 %; 2 mL bupivacaine 0.5 % Aspiration Attempted: No   Patient tolerance:  Patient tolerated the procedure well with no immediate complications     Clinical Data: No additional findings.   Subjective: No chief complaint on file. Seen last year for evaluation of right shoulder pain that responded to a subacromial cortisone injection. Also had multiple areas of trigger point tenderness along the scapula. Several weeks she's had some recurrent pain without  injury or trauma. She's had difficulty raising her arm over her head. Not having any trouble sleeping.  HPI  Review of Systems   Objective: Vital Signs: There were no vitals taken for this visit.  Physical Exam  Ortho Exam awake alert and oriented 3. Comfortable sitting. Positive impingement and minimally positive empty can testing right shoulder. Anterior subacromial pain. Full range of motion with painful arc. Some areas of trigger point tenderness along the scapula. No pain with range of motion of cervical spine. Good grip good release. Denies shortness of breath or chest pain no. No skin rashes.  Specialty Comments:  No specialty comments available.  Imaging: No results found.   PMFS History: Patient Active Problem List   Diagnosis Date Noted  . Chest discomfort 09/19/2016  . Heart palpitations 08/15/2016  . ASCVD (arteriosclerotic cardiovascular disease) 02/08/2016  . Claudication ([place]) 01/04/2016  . Dyslipidemia 01/04/2016  . Calcification of coronary artery 01/04/2016  . Chest pain, unspecified 01/04/2016  . Cystocele 06/06/2014  . Acid reflux 03/31/2014  . H/O malignant neoplasm of breast 03/31/2014  . H/O infectious disease 03/31/2014  . HLD (hyperlipidemia) 03/31/2014  . OP (osteoporosis) 03/31/2014  . Smoker 03/31/2014  . MRSA (methicillin resistant staph aureus) culture positive 12/04/2013  . History of DVT (deep vein thrombosis) 05/18/2013  . Cataract 01/20/2013  . 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 Urge incontinence 07/11/2011   Past Medical History:  Diagnosis Date  .  Cancer ([place]) 3/07   right breast/ER/PR positive  . Cataract    and dry eye/spots on retina  . Cataract    surgery on right eye 04/12/14  . Condyloma 01/1998  . Depression   . Grave's disease   . Macular degeneration of both eyes   . Microhematuria 07/1992   negative IVP, [place]  . MRSA (methicillin resistant Staphylococcus aureus)   . Shingles     Family History  Problem Relation Age of  Onset  . Hypertension Mother   . Heart disease Mother     Past Surgical History:  Procedure Laterality Date  . ABDOMINAL HYSTERECTOMY    . BREAST BIOPSY  1990   left  . CATARACT EXTRACTION  04/12/14   right eye  . CATARACT EXTRACTION  2/16   left eye  . CHOLECYSTECTOMY    . EYE SURGERY    . goiter  7/93   radioactive iodine  . INGUINAL HERNIA REPAIR  09/29/2011   Procedure: HERNIA REPAIR INGUINAL ADULT;  Surgeon: Rolm Bookbinder, MD;  Location: [facility];  Service: General;  Laterality: Right;  . TUBAL LIGATION     D&C   Social History   Occupational History  . Not on file.   Social History Main Topics  . Smoking status: Current Every Day Smoker    Packs/day: 0.50    Years: 54.00    Types: Cigarettes  . Smokeless tobacco: Never Used  . Alcohol use No  . Drug use: No  . Sexual activity: Not on file     Garald Balding, MD   Note - This record has been created using [employer].  Chart creation errors have been sought, but may not always  have been located. Such creation errors do not reflect on  the standard of medical care.

## 2017-03-07 NOTE — Telephone Encounter (Signed)
LMTC x 1  

## 2017-03-07 NOTE — Telephone Encounter (Signed)
Pt informed of CT results per Sarah Groce, NP.  PT verbalized understanding.  Copy sent to PCP.  Order placed for 1 yr f/u CT.  

## 2017-03-13 ENCOUNTER — Ambulatory Visit (INDEPENDENT_AMBULATORY_CARE_PROVIDER_SITE_OTHER): Payer: Medicare Other | Admitting: Licensed Clinical Social Worker

## 2017-03-13 DIAGNOSIS — F3341 Major depressive disorder, recurrent, in partial remission: Secondary | ICD-10-CM

## 2017-03-20 ENCOUNTER — Ambulatory Visit (INDEPENDENT_AMBULATORY_CARE_PROVIDER_SITE_OTHER): Payer: Medicare Other | Admitting: Orthopaedic Surgery

## 2017-03-20 ENCOUNTER — Encounter (INDEPENDENT_AMBULATORY_CARE_PROVIDER_SITE_OTHER): Payer: Self-pay | Admitting: Orthopaedic Surgery

## 2017-03-20 DIAGNOSIS — M170 Bilateral primary osteoarthritis of knee: Secondary | ICD-10-CM | POA: Insufficient documentation

## 2017-03-20 MED ORDER — BUPIVACAINE HCL 0.5 % IJ SOLN
2.0000 mL | INTRAMUSCULAR | Status: AC | PRN
  Administered 2017-03-20: 2 mL via INTRA_ARTICULAR

## 2017-03-20 MED ORDER — METHYLPREDNISOLONE ACETATE 40 MG/ML IJ SUSP
80.0000 mg | INTRAMUSCULAR | Status: AC | PRN
  Administered 2017-03-20: 80 mg

## 2017-03-20 MED ORDER — LIDOCAINE HCL 1 % IJ SOLN
2.0000 mL | INTRAMUSCULAR | Status: AC | PRN
  Administered 2017-03-20: 2 mL

## 2017-03-20 NOTE — Progress Notes (Signed)
Office Visit Note   Patient: Victoria Sims           Date of Birth: August 23, 1940           MRN: [id number] Visit Date: 03/20/2017              Requested by: Thressa Sheller, [address], [address] [place], [place] [address] PCP: Thressa Sheller, MD   Assessment & Plan: Visit Diagnoses:  1. Bilateral primary osteoarthritis of knee     Plan: bilateral  knee injections today. F/U PRN  Follow-Up Instructions: Return if symptoms worsen or fail to improve.   Orders:  No orders of the defined types were placed in this encounter.  No orders of the defined types were placed in this encounter.     Procedures: Large Joint Inj: R knee on 03/20/2017 11:21 AM Indications: pain and diagnostic evaluation Details: 25 G 1.5 in needle, anteromedial approach  Arthrogram: No  Medications: 2 mL bupivacaine 0.5 %; 2 mL lidocaine 1 %; 80 mg methylPREDNISolone acetate 40 MG/ML Procedure, treatment alternatives, risks and benefits explained, specific risks discussed. Consent was given by the patient. Patient was prepped and draped in the usual sterile fashion.   Large Joint Inj: L knee on 03/20/2017 11:22 AM Indications: pain and diagnostic evaluation Details: 25 G 1.5 in needle, anteromedial approach  Arthrogram: No  Medications: 2 mL lidocaine 1 %; 2 mL bupivacaine 0.5 %; 80 mg methylPREDNISolone acetate 40 MG/ML Procedure, treatment alternatives, risks and benefits explained, specific risks discussed. Consent was given by the patient. Patient was prepped and draped in the usual sterile fashion.       Clinical Data: No additional findings.   Subjective: No chief complaint on file. Several weeks status post subacromial injection for shoulder and feeling better.  Still having trigger point areas of tenderness left scapula.  Bilateral knee osteoarthritis active and wants an injection  HPI  Review of Systems   Objective: Vital Signs: There were no vitals taken for  this visit.  Physical Exam  Ortho Exam some areas of trigger point tenderness scapula-right.  Full range of motion with neg impingement. Medial jt pain both knees with minimal effusion bil.  Specialty Comments:  No specialty comments available.  Imaging: No results found.   PMFS History: Patient Active Problem List   Diagnosis Date Noted  . Bilateral primary osteoarthritis of knee 03/20/2017  . Chest discomfort 09/19/2016  . Heart palpitations 08/15/2016  . ASCVD (arteriosclerotic cardiovascular disease) 02/08/2016  . Claudication ([place]) 01/04/2016  . Dyslipidemia 01/04/2016  . Calcification of coronary artery 01/04/2016  . Chest pain, unspecified 01/04/2016  . Cystocele 06/06/2014  . Acid reflux 03/31/2014  . H/O malignant neoplasm of breast 03/31/2014  . H/O infectious disease 03/31/2014  . HLD (hyperlipidemia) 03/31/2014  . OP (osteoporosis) 03/31/2014  . Smoker 03/31/2014  . MRSA (methicillin resistant staph aureus) culture positive 12/04/2013  . History of DVT (deep vein thrombosis) 05/18/2013  . Cataract 01/20/2013  . 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 Urge incontinence 07/11/2011   Past Medical History:  Diagnosis Date  . Cancer ([place]) 3/07   right breast/ER/PR positive  . Cataract    and dry eye/spots on retina  . Cataract    surgery on right eye 04/12/14  . Condyloma 01/1998  . Depression   . Grave's disease   . Macular degeneration of both eyes   . Microhematuria 07/1992   negative IVP, [place]  . MRSA (methicillin resistant Staphylococcus aureus)   .  Shingles     Family History  Problem Relation Age of Onset  . Hypertension Mother   . Heart disease Mother     Past Surgical History:  Procedure Laterality Date  . ABDOMINAL HYSTERECTOMY    . BREAST BIOPSY  1990   left  . CATARACT EXTRACTION  04/12/14   right eye  . CATARACT EXTRACTION  2/16   left eye  . CHOLECYSTECTOMY    . EYE SURGERY    . goiter  7/93   radioactive iodine  . TUBAL LIGATION     D&C     Social History   Occupational History  . Not on file  Tobacco Use  . Smoking status: Current Every Day Smoker    Packs/day: 0.50    Years: 54.00    Pack years: 27.00    Types: Cigarettes  . Smokeless tobacco: Never Used  Substance and Sexual Activity  . Alcohol use: No    Alcohol/week: 0.0 oz  . Drug use: No  . Sexual activity: Not on file     Garald Balding, MD   Note - This record has been created using [employer].  Chart creation errors have been sought, but may not always  have been located. Such creation errors do not reflect on  the standard of medical care.

## 2017-03-27 ENCOUNTER — Ambulatory Visit (INDEPENDENT_AMBULATORY_CARE_PROVIDER_SITE_OTHER): Payer: Medicare Other | Admitting: Licensed Clinical Social Worker

## 2017-03-27 DIAGNOSIS — F3341 Major depressive disorder, recurrent, in partial remission: Secondary | ICD-10-CM

## 2017-04-10 ENCOUNTER — Ambulatory Visit (INDEPENDENT_AMBULATORY_CARE_PROVIDER_SITE_OTHER): Payer: Medicare Other | Admitting: Licensed Clinical Social Worker

## 2017-04-10 DIAGNOSIS — F3341 Major depressive disorder, recurrent, in partial remission: Secondary | ICD-10-CM

## 2017-04-24 ENCOUNTER — Ambulatory Visit (INDEPENDENT_AMBULATORY_CARE_PROVIDER_SITE_OTHER): Payer: Medicare Other | Admitting: Licensed Clinical Social Worker

## 2017-04-24 DIAGNOSIS — R1310 Dysphagia, unspecified: Secondary | ICD-10-CM | POA: Diagnosis not present

## 2017-04-24 DIAGNOSIS — F3341 Major depressive disorder, recurrent, in partial remission: Secondary | ICD-10-CM | POA: Diagnosis not present

## 2017-04-24 DIAGNOSIS — K219 Gastro-esophageal reflux disease without esophagitis: Secondary | ICD-10-CM | POA: Diagnosis not present

## 2017-04-24 DIAGNOSIS — Z23 Encounter for immunization: Secondary | ICD-10-CM | POA: Diagnosis not present

## 2017-04-24 DIAGNOSIS — F419 Anxiety disorder, unspecified: Secondary | ICD-10-CM | POA: Diagnosis not present

## 2017-04-28 IMAGING — NM NM MISC PROCEDURE
6 series · 36 of 36 positions shown · non-contrast
Comparison: none

[Series 1: wbr_r-proj_st wbr rest · 6.40mm/px · 6 of 64 frames shown]
[frame 6/64]
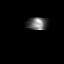
[frame 16/64]
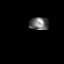
[frame 27/64]
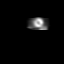
[frame 38/64]
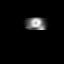
[frame 48/64]
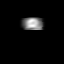
[frame 59/64]
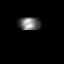

[Series 1: wbr rest · 6.40mm/px · 6 of 62 frames shown]
[frame 6/62]
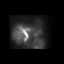
[frame 16/62]
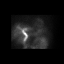
[frame 26/62]
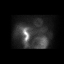
[frame 37/62]
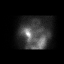
[frame 47/62]
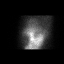
[frame 57/62]
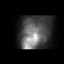

[Series 2: wbr_s-proj_st wbr stress-gsp · 6.40mm/px · 6 of 512 frames shown]
[frame 43/512]
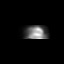
[frame 128/512]
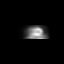
[frame 214/512]
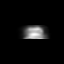
[frame 299/512]
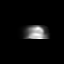
[frame 384/512]
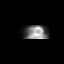
[frame 470/512]
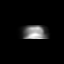

[Series 2: wbr stress-gsp · 6.40mm/px · 6 of 512 frames shown]
[frame 43/512]
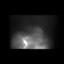
[frame 128/512]
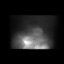
[frame 214/512]
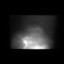
[frame 299/512]
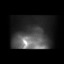
[frame 384/512]
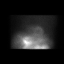
[frame 470/512]
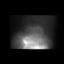

[Series 3: wbr stress-sum-em · 6.40mm/px · 6 of 64 frames shown]
[frame 6/64]
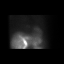
[frame 16/64]
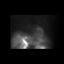
[frame 27/64]
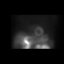
[frame 38/64]
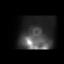
[frame 48/64]
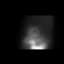
[frame 59/64]
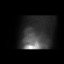

[Series 3: wbr_s-proj_st wbr stress-sum-em · 6.40mm/px · 6 of 64 frames shown]
[frame 6/64]
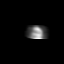
[frame 16/64]
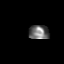
[frame 27/64]
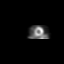
[frame 38/64]
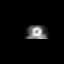
[frame 48/64]
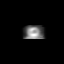
[frame 59/64]
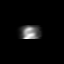

[36 of 36 positions shown; findings below may reference images not displayed]

Canned report from images found in remote index.

Refer to host system for actual result text.

## 2017-05-01 ENCOUNTER — Encounter (INDEPENDENT_AMBULATORY_CARE_PROVIDER_SITE_OTHER): Payer: Self-pay | Admitting: Internal Medicine

## 2017-05-01 ENCOUNTER — Encounter (INDEPENDENT_AMBULATORY_CARE_PROVIDER_SITE_OTHER): Payer: Self-pay

## 2017-05-10 ENCOUNTER — Ambulatory Visit (INDEPENDENT_AMBULATORY_CARE_PROVIDER_SITE_OTHER): Payer: Medicare Other | Admitting: Internal Medicine

## 2017-05-10 ENCOUNTER — Encounter (INDEPENDENT_AMBULATORY_CARE_PROVIDER_SITE_OTHER): Payer: Self-pay | Admitting: Internal Medicine

## 2017-05-10 ENCOUNTER — Encounter (INDEPENDENT_AMBULATORY_CARE_PROVIDER_SITE_OTHER): Payer: Self-pay | Admitting: *Deleted

## 2017-05-10 VITALS — BP 126/78 | HR 60 | Temp 98.3°F | Ht 61.0 in | Wt 130.9 lb

## 2017-05-10 DIAGNOSIS — R1310 Dysphagia, unspecified: Secondary | ICD-10-CM

## 2017-05-10 DIAGNOSIS — R1319 Other dysphagia: Secondary | ICD-10-CM

## 2017-05-10 DIAGNOSIS — K219 Gastro-esophageal reflux disease without esophagitis: Secondary | ICD-10-CM | POA: Diagnosis not present

## 2017-05-10 NOTE — Patient Instructions (Signed)
Stop the Dexilant and start the Protonix. Esophagram.

## 2017-05-10 NOTE — Progress Notes (Signed)
Subjective:    Patient ID: Victoria Sims, female    DOB: [birth year], 77 y.o.   MRN: [id number]  HPI Referred by Dr. Maudie Mercury for GERD. She tells me she is having trouble swallowing. Everytime she eats, she coughs. She says she sees white mucous. She has to cut her food up for it to go down.  Years ago, she had a piece of steak to lodge and her husband removed it. She has never undergone an EGD/ED.  She says anything she eats she eats in small portions. She has had symptoms for years. Her appetite is good. She says she has lost some weight (5 pounds) which was intentional.  Her BMs are good. She take a stool softener daily. Hx of constipation.   She says the Dexilant is not helping.    Las saw GI (Dr. Christoper Fabian in [place] for colonoscopy in 2008 and she reports it was normal.   12/20/2016 H and H  13.5 and 40.1, Platelet ct 204, total bili 0.9, AST 40, ALT 28, ALP 71,  Albumin 3.6, glucose 85, BUn 27, Creat. 1.2.  Review of Systems      Past Medical History:  Diagnosis Date  . Cancer ([place]) 3/07   right breast/ER/PR positive  . Cataract    and dry eye/spots on retina  . Cataract    surgery on right eye 04/12/14  . Condyloma 01/1998  . Depression   . Grave's disease   . Macular degeneration of both eyes   . Microhematuria 07/1992   negative IVP, [place]  . MRSA (methicillin resistant Staphylococcus aureus)   . Shingles     Past Surgical History:  Procedure Laterality Date  . ABDOMINAL HYSTERECTOMY    . BREAST BIOPSY  1990   left  . CATARACT EXTRACTION  04/12/14   right eye  . CATARACT EXTRACTION  2/16   left eye  . CHOLECYSTECTOMY    . EYE SURGERY    . goiter  7/93   radioactive iodine  . INGUINAL HERNIA REPAIR  09/29/2011   Procedure: HERNIA REPAIR INGUINAL ADULT;  Surgeon: Rolm Bookbinder, MD;  Location: [facility];  Service: General;  Laterality: Right;  . TUBAL LIGATION     D&C    Allergies  Allergen Reactions  . Atorvastatin Other (See Comments)    UTIs  .  Gabapentin Nausea And Vomiting  . Macrobid [Nitrofurantoin Macrocrystal] Other (See Comments)    hallucination  . Sulfa Antibiotics Rash    Current Outpatient Medications on File Prior to Visit  Medication Sig Dispense Refill  . ALPRAZolam (XANAX) 1 MG tablet Take 1 tablet (1 mg total) by mouth at bedtime as needed for sleep. 30 tablet 1  . aspirin 81 MG EC tablet Take 81 mg by mouth daily.  99  . bisacodyl (DULCOLAX) 5 MG EC tablet Take 5 mg by mouth daily as needed for moderate constipation.    . Calcium-Vitamin D (CALTRATE 600 PLUS-VIT D PO) Take 1 tablet by mouth daily.    . Cholecalciferol (D3-1000) 1000 UNITS capsule Take 1,000 Units by mouth daily.    Marland Kitchen CLONAZEPAM PO Take by mouth as needed.    Marland Kitchen dexlansoprazole (DEXILANT) 60 MG capsule Take 60 mg by mouth daily.    Marland Kitchen escitalopram (LEXAPRO) 20 MG tablet Take 40 mg by mouth daily.    . furosemide (LASIX) 40 MG tablet Take 40 mg by mouth daily.    Marland Kitchen levothyroxine (SYNTHROID, LEVOTHROID) 100 MCG tablet Take 100 mcg by  mouth daily.    . mupirocin ointment (BACTROBAN) 2 % APPLY 1 APPLICATION INTO NOSE TWICE DAILY 22 g 0  . potassium chloride (K-DUR) 10 MEQ tablet Take 10 mEq by mouth daily. Take 1 tablet on Monday, Tuesday, and Wednesday only. Do not take Thursday, Friday, Saturday, Sunday    . rosuvastatin (CRESTOR) 20 MG tablet Take 20 mg by mouth daily.    . rosuvastatin (CRESTOR) 20 MG tablet Take 1 tablet (20 mg total) by mouth daily. 90 tablet 0   No current facility-administered medications on file prior to visit.         Objective:   Physical Exam Blood pressure 126/78, pulse 60, temperature 98.3 F (36.8 C), height 5\' 1"  (1.549 m), weight 130 lb 14.4 oz (59.4 kg). Alert and oriented. Skin warm and dry. Oral mucosa is moist.   . Sclera anicteric, conjunctivae is pink. Thyroid not enlarged. No cervical lymphadenopathy. Lungs clear. Heart regular rate and rhythm.  Abdomen is soft. Bowel sounds are positive. No hepatomegaly.  No abdominal masses felt. No tenderness.  No edema to lower extremities.           Assessment & Plan:  GERD: Start the Protonix back. Dysphagia: Am going to get an Esophagram.

## 2017-05-13 ENCOUNTER — Ambulatory Visit (HOSPITAL_COMMUNITY)
Admission: RE | Admit: 2017-05-13 | Discharge: 2017-05-13 | Disposition: A | Discharge status: Home / Self Care | Payer: Medicare Other | Source: Ambulatory Visit | Attending: Internal Medicine | Admitting: Internal Medicine

## 2017-05-13 DIAGNOSIS — K449 Diaphragmatic hernia without obstruction or gangrene: Secondary | ICD-10-CM | POA: Insufficient documentation

## 2017-05-13 DIAGNOSIS — R1319 Other dysphagia: Secondary | ICD-10-CM

## 2017-05-13 DIAGNOSIS — R1310 Dysphagia, unspecified: Secondary | ICD-10-CM | POA: Diagnosis not present

## 2017-05-14 ENCOUNTER — Encounter (INDEPENDENT_AMBULATORY_CARE_PROVIDER_SITE_OTHER): Payer: Self-pay | Admitting: Internal Medicine

## 2017-05-14 NOTE — Progress Notes (Signed)
Patient was given an appointment for 08/12/17 at 10:45am with Deberah Castle, NP. A letter was mailed to the patient.

## 2017-05-15 DIAGNOSIS — K219 Gastro-esophageal reflux disease without esophagitis: Secondary | ICD-10-CM | POA: Diagnosis not present

## 2017-05-15 DIAGNOSIS — E785 Hyperlipidemia, unspecified: Secondary | ICD-10-CM | POA: Diagnosis not present

## 2017-05-15 DIAGNOSIS — F419 Anxiety disorder, unspecified: Secondary | ICD-10-CM | POA: Diagnosis not present

## 2017-05-15 DIAGNOSIS — I2510 Atherosclerotic heart disease of native coronary artery without angina pectoris: Secondary | ICD-10-CM | POA: Diagnosis not present

## 2017-05-15 DIAGNOSIS — N390 Urinary tract infection, site not specified: Secondary | ICD-10-CM | POA: Diagnosis not present

## 2017-05-15 DIAGNOSIS — E559 Vitamin D deficiency, unspecified: Secondary | ICD-10-CM | POA: Diagnosis not present

## 2017-05-15 DIAGNOSIS — E032 Hypothyroidism due to medicaments and other exogenous substances: Secondary | ICD-10-CM | POA: Diagnosis not present

## 2017-05-20 ENCOUNTER — Other Ambulatory Visit: Payer: Self-pay | Admitting: Internal Medicine

## 2017-05-20 DIAGNOSIS — E785 Hyperlipidemia, unspecified: Secondary | ICD-10-CM

## 2017-05-22 ENCOUNTER — Ambulatory Visit (INDEPENDENT_AMBULATORY_CARE_PROVIDER_SITE_OTHER): Payer: Medicare Other | Admitting: Licensed Clinical Social Worker

## 2017-05-22 DIAGNOSIS — N183 Chronic kidney disease, stage 3 (moderate): Secondary | ICD-10-CM | POA: Diagnosis not present

## 2017-05-22 DIAGNOSIS — F3341 Major depressive disorder, recurrent, in partial remission: Secondary | ICD-10-CM | POA: Diagnosis not present

## 2017-05-23 ENCOUNTER — Ambulatory Visit: Payer: Medicare Other | Admitting: Licensed Clinical Social Worker

## 2017-05-30 DIAGNOSIS — N183 Chronic kidney disease, stage 3 (moderate): Secondary | ICD-10-CM | POA: Diagnosis not present

## 2017-06-05 ENCOUNTER — Ambulatory Visit (INDEPENDENT_AMBULATORY_CARE_PROVIDER_SITE_OTHER): Payer: Medicare Other | Admitting: Licensed Clinical Social Worker

## 2017-06-05 DIAGNOSIS — F3341 Major depressive disorder, recurrent, in partial remission: Secondary | ICD-10-CM

## 2017-06-05 DIAGNOSIS — M85851 Other specified disorders of bone density and structure, right thigh: Secondary | ICD-10-CM | POA: Diagnosis not present

## 2017-06-05 DIAGNOSIS — E876 Hypokalemia: Secondary | ICD-10-CM | POA: Diagnosis not present

## 2017-06-05 DIAGNOSIS — Z853 Personal history of malignant neoplasm of breast: Secondary | ICD-10-CM | POA: Diagnosis not present

## 2017-06-05 DIAGNOSIS — K219 Gastro-esophageal reflux disease without esophagitis: Secondary | ICD-10-CM | POA: Diagnosis not present

## 2017-06-05 DIAGNOSIS — I2510 Atherosclerotic heart disease of native coronary artery without angina pectoris: Secondary | ICD-10-CM | POA: Diagnosis not present

## 2017-06-05 DIAGNOSIS — E7800 Pure hypercholesterolemia, unspecified: Secondary | ICD-10-CM | POA: Diagnosis not present

## 2017-06-05 DIAGNOSIS — E039 Hypothyroidism, unspecified: Secondary | ICD-10-CM | POA: Diagnosis not present

## 2017-06-05 DIAGNOSIS — M85852 Other specified disorders of bone density and structure, left thigh: Secondary | ICD-10-CM | POA: Diagnosis not present

## 2017-06-05 DIAGNOSIS — R319 Hematuria, unspecified: Secondary | ICD-10-CM | POA: Diagnosis not present

## 2017-06-05 DIAGNOSIS — N390 Urinary tract infection, site not specified: Secondary | ICD-10-CM | POA: Diagnosis not present

## 2017-06-05 DIAGNOSIS — M8580 Other specified disorders of bone density and structure, unspecified site: Secondary | ICD-10-CM | POA: Diagnosis not present

## 2017-06-05 DIAGNOSIS — F339 Major depressive disorder, recurrent, unspecified: Secondary | ICD-10-CM | POA: Diagnosis not present

## 2017-06-05 DIAGNOSIS — M859 Disorder of bone density and structure, unspecified: Secondary | ICD-10-CM | POA: Diagnosis not present

## 2017-06-05 DIAGNOSIS — F17200 Nicotine dependence, unspecified, uncomplicated: Secondary | ICD-10-CM | POA: Diagnosis not present

## 2017-06-07 ENCOUNTER — Other Ambulatory Visit: Payer: Self-pay | Admitting: Internal Medicine

## 2017-06-07 DIAGNOSIS — F17219 Nicotine dependence, cigarettes, with unspecified nicotine-induced disorders: Secondary | ICD-10-CM

## 2017-06-12 DIAGNOSIS — M8580 Other specified disorders of bone density and structure, unspecified site: Secondary | ICD-10-CM | POA: Diagnosis not present

## 2017-06-12 DIAGNOSIS — J029 Acute pharyngitis, unspecified: Secondary | ICD-10-CM | POA: Diagnosis not present

## 2017-06-19 ENCOUNTER — Ambulatory Visit (INDEPENDENT_AMBULATORY_CARE_PROVIDER_SITE_OTHER): Payer: Medicare Other | Admitting: Licensed Clinical Social Worker

## 2017-06-19 DIAGNOSIS — F3341 Major depressive disorder, recurrent, in partial remission: Secondary | ICD-10-CM | POA: Diagnosis not present

## 2017-06-20 ENCOUNTER — Ambulatory Visit
Admission: RE | Admit: 2017-06-20 | Discharge: 2017-06-20 | Disposition: A | Discharge status: Home / Self Care | Payer: Medicare Other | Source: Ambulatory Visit | Attending: Internal Medicine | Admitting: Internal Medicine

## 2017-06-20 DIAGNOSIS — J439 Emphysema, unspecified: Secondary | ICD-10-CM | POA: Diagnosis not present

## 2017-06-20 DIAGNOSIS — F17219 Nicotine dependence, cigarettes, with unspecified nicotine-induced disorders: Secondary | ICD-10-CM

## 2017-07-03 ENCOUNTER — Ambulatory Visit (INDEPENDENT_AMBULATORY_CARE_PROVIDER_SITE_OTHER): Payer: Medicare Other | Admitting: Licensed Clinical Social Worker

## 2017-07-03 DIAGNOSIS — F3341 Major depressive disorder, recurrent, in partial remission: Secondary | ICD-10-CM

## 2017-07-15 ENCOUNTER — Other Ambulatory Visit: Payer: Self-pay | Admitting: Obstetrics & Gynecology

## 2017-07-15 DIAGNOSIS — Z1231 Encounter for screening mammogram for malignant neoplasm of breast: Secondary | ICD-10-CM

## 2017-07-17 ENCOUNTER — Ambulatory Visit (INDEPENDENT_AMBULATORY_CARE_PROVIDER_SITE_OTHER): Payer: Medicare Other | Admitting: Licensed Clinical Social Worker

## 2017-07-17 DIAGNOSIS — F3341 Major depressive disorder, recurrent, in partial remission: Secondary | ICD-10-CM | POA: Diagnosis not present

## 2017-07-31 ENCOUNTER — Ambulatory Visit (INDEPENDENT_AMBULATORY_CARE_PROVIDER_SITE_OTHER): Payer: Medicare Other | Admitting: Licensed Clinical Social Worker

## 2017-07-31 DIAGNOSIS — F3341 Major depressive disorder, recurrent, in partial remission: Secondary | ICD-10-CM

## 2017-08-01 ENCOUNTER — Ambulatory Visit: Payer: Medicare Other | Admitting: Licensed Clinical Social Worker

## 2017-08-06 DIAGNOSIS — L570 Actinic keratosis: Secondary | ICD-10-CM | POA: Diagnosis not present

## 2017-08-06 DIAGNOSIS — L400 Psoriasis vulgaris: Secondary | ICD-10-CM | POA: Diagnosis not present

## 2017-08-06 DIAGNOSIS — D179 Benign lipomatous neoplasm, unspecified: Secondary | ICD-10-CM | POA: Diagnosis not present

## 2017-08-07 ENCOUNTER — Ambulatory Visit (INDEPENDENT_AMBULATORY_CARE_PROVIDER_SITE_OTHER): Payer: Medicare Other | Admitting: Licensed Clinical Social Worker

## 2017-08-07 DIAGNOSIS — N390 Urinary tract infection, site not specified: Secondary | ICD-10-CM | POA: Diagnosis not present

## 2017-08-07 DIAGNOSIS — R296 Repeated falls: Secondary | ICD-10-CM | POA: Diagnosis not present

## 2017-08-07 DIAGNOSIS — R2689 Other abnormalities of gait and mobility: Secondary | ICD-10-CM | POA: Diagnosis not present

## 2017-08-07 DIAGNOSIS — F3341 Major depressive disorder, recurrent, in partial remission: Secondary | ICD-10-CM | POA: Diagnosis not present

## 2017-08-12 ENCOUNTER — Ambulatory Visit (INDEPENDENT_AMBULATORY_CARE_PROVIDER_SITE_OTHER): Payer: Self-pay | Admitting: Internal Medicine

## 2017-08-14 DIAGNOSIS — R2689 Other abnormalities of gait and mobility: Secondary | ICD-10-CM | POA: Diagnosis not present

## 2017-08-21 ENCOUNTER — Ambulatory Visit: Payer: Medicare Other | Admitting: Licensed Clinical Social Worker

## 2017-08-21 ENCOUNTER — Ambulatory Visit: Payer: Self-pay

## 2017-09-04 ENCOUNTER — Ambulatory Visit (INDEPENDENT_AMBULATORY_CARE_PROVIDER_SITE_OTHER): Payer: Medicare Other | Admitting: Licensed Clinical Social Worker

## 2017-09-04 DIAGNOSIS — F3341 Major depressive disorder, recurrent, in partial remission: Secondary | ICD-10-CM

## 2017-09-18 ENCOUNTER — Encounter (INDEPENDENT_AMBULATORY_CARE_PROVIDER_SITE_OTHER): Payer: Self-pay | Admitting: Orthopaedic Surgery

## 2017-09-18 ENCOUNTER — Ambulatory Visit
Admission: RE | Admit: 2017-09-18 | Discharge: 2017-09-18 | Disposition: A | Discharge status: Home / Self Care | Payer: Medicare Other | Source: Ambulatory Visit | Attending: Obstetrics & Gynecology | Admitting: Obstetrics & Gynecology

## 2017-09-18 ENCOUNTER — Ambulatory Visit (INDEPENDENT_AMBULATORY_CARE_PROVIDER_SITE_OTHER): Payer: Medicare Other | Admitting: Orthopaedic Surgery

## 2017-09-18 VITALS — BP 98/57 | HR 54 | Resp 18 | Ht 61.0 in | Wt 133.0 lb

## 2017-09-18 DIAGNOSIS — M170 Bilateral primary osteoarthritis of knee: Secondary | ICD-10-CM

## 2017-09-18 DIAGNOSIS — Z1231 Encounter for screening mammogram for malignant neoplasm of breast: Secondary | ICD-10-CM

## 2017-09-18 DIAGNOSIS — M1712 Unilateral primary osteoarthritis, left knee: Secondary | ICD-10-CM

## 2017-09-18 DIAGNOSIS — M1711 Unilateral primary osteoarthritis, right knee: Secondary | ICD-10-CM | POA: Diagnosis not present

## 2017-09-18 MED ORDER — BUPIVACAINE HCL 0.5 % IJ SOLN
2.0000 mL | INTRAMUSCULAR | Status: AC | PRN
  Administered 2017-09-18: 2 mL via INTRA_ARTICULAR

## 2017-09-18 MED ORDER — METHYLPREDNISOLONE ACETATE 40 MG/ML IJ SUSP
80.0000 mg | INTRAMUSCULAR | Status: AC | PRN
  Administered 2017-09-18: 80 mg

## 2017-09-18 MED ORDER — LIDOCAINE HCL 1 % IJ SOLN
2.0000 mL | INTRAMUSCULAR | Status: AC | PRN
  Administered 2017-09-18: 2 mL

## 2017-09-18 NOTE — Progress Notes (Signed)
Office Visit Note   Patient: Victoria Sims           Date of Birth: 1940/10/30           MRN: [id number] Visit Date: 09/18/2017              Requested by: Jani Gravel, MD [address] [place], [place] [address] PCP: Jani Gravel, MD   Assessment & Plan: Visit Diagnoses:  1. Bilateral primary osteoarthritis of knee     Plan: Primary bilateral knee osteoarthritis.  Long discussion regarding her diagnosis.  Victoria Sims has had some issues with balance which could be a combination of factors including her knees.  Primary care doctor is suggested she try a walker but she is been hesitant to use that device.  Will reinject both knees with cortisone and monitor her response.  I would be interested to know if the injections help with her balance  Follow-Up Instructions: Return if symptoms worsen or fail to improve.   Orders:  Orders Placed This Encounter  Procedures  . Large Joint Inj: L knee  . Large Joint Inj: R knee   No orders of the defined types were placed in this encounter.     Procedures: Large Joint Inj: L knee on 09/18/2017 2:22 PM Indications: pain and diagnostic evaluation Details: 25 G 1.5 in needle, anteromedial approach  Arthrogram: No  Medications: 2 mL bupivacaine 0.5 %; 2 mL lidocaine 1 %; 80 mg methylPREDNISolone acetate 40 MG/ML Procedure, treatment alternatives, risks and benefits explained, specific risks discussed. Consent was given by the patient. Patient was prepped and draped in the usual sterile fashion.   Large Joint Inj: R knee on 09/18/2017 2:23 PM Indications: pain and diagnostic evaluation Details: 25 G 1.5 in needle, anteromedial approach  Arthrogram: No  Medications: 2 mL lidocaine 1 %; 2 mL bupivacaine 0.5 %; 80 mg methylPREDNISolone acetate 40 MG/ML Procedure, treatment alternatives, risks and benefits explained, specific risks discussed. Consent was given by the patient. Immediately prior to procedure a time out was called  to verify the correct patient, procedure, equipment, support staff and site/side marked as required. Patient was prepped and draped in the usual sterile fashion.       Clinical Data: No additional findings.   Subjective: Chief Complaint  Patient presents with  . Right Knee - Pain  . Left Knee - Pain  . New Patient (Initial Visit)    bil lat knee pain for over 1 yr. had injections in both knees 03/20/17 had a few falls, last fall was last thursday, unable to go up stairs or getting in car  Victoria Sims was last seen in November for evaluation of bilateral knee pain.  Films were obtained demonstrating degenerative changes in all 3 compartments but predominantly the medial compartment.  The joint spaces are well-maintained just narrowed.  There were some areas of subchondral sclerosis and small osteophyte formation medially.  Some recurrence of her pain and wishes to have injections.  She does have a generalized problem with balance and.  She is not sure the exact origin and has discussed with her family physician and she has had some back problems HPI  Review of Systems  Constitutional: Positive for fatigue.  HENT: Negative for ear pain.   Eyes: Negative for pain.  Respiratory: Negative for cough and shortness of breath.   Cardiovascular: Positive for leg swelling.  Gastrointestinal: Negative for constipation and diarrhea.  Genitourinary: Negative for difficulty urinating.  Musculoskeletal: Positive for  back pain. Negative for neck pain.  Skin: Negative for rash.  Allergic/Immunologic: Negative for food allergies.  Neurological: Positive for weakness. Negative for numbness.  Hematological: Bruises/bleeds easily.  Psychiatric/Behavioral: Negative for sleep disturbance.     Objective: Vital Signs: BP (!) 98/57 (BP Location: Left Arm, Patient Position: Sitting, Cuff Size: Normal)   Pulse (!) 54   Resp 18   Ht 5\' 1"  (1.549 m)   Wt 133 lb (60.3 kg)   BMI 25.13 kg/m   Physical  Exam  Constitutional: She is oriented to person, place, and time. She appears well-developed and well-nourished.  HENT:  Mouth/Throat: Oropharynx is clear and moist.  Eyes: Pupils are equal, round, and reactive to light. EOM are normal.  Pulmonary/Chest: Effort normal.  Neurological: She is alert and oriented to person, place, and time.  Skin: Skin is warm and dry.  Psychiatric: She has a normal mood and affect. Her behavior is normal.    Ortho Exam awake alert and oriented x3.  Comfortable sitting.  Minimal effusion both knees.  The knees were not hot warm or red.  Predominantly medial joint pain bilaterally.  No popliteal or calf pain or masses.  No swelling distally.  Neurovascular exam Specialty Comments:  No specialty comments available.  Imaging: Mm Screening Breast Tomo Bilateral  Result Date: 09/18/2017 CLINICAL DATA:  Screening. EXAM: DIGITAL SCREENING BILATERAL MAMMOGRAM WITH TOMO AND CAD COMPARISON:  Previous exam(s). ACR Breast Density Category b: There are scattered areas of fibroglandular density. FINDINGS: There are no findings suspicious for malignancy. Images were processed with CAD. IMPRESSION: No mammographic evidence of malignancy. A result letter of this screening mammogram will be mailed directly to the patient. RECOMMENDATION: Screening mammogram in one year. (Code:SM-B-01Y) BI-RADS CATEGORY  1: Negative. Electronically Signed   By: Dorise Bullion III M.D   On: 09/18/2017 10:47     PMFS History: Patient Active Problem List   Diagnosis Date Noted  . Bilateral primary osteoarthritis of knee 03/20/2017  . Chest discomfort 09/19/2016  . Heart palpitations 08/15/2016  . ASCVD (arteriosclerotic cardiovascular disease) 02/08/2016  . Claudication ([place]) 01/04/2016  . Dyslipidemia 01/04/2016  . Calcification of coronary artery 01/04/2016  . Chest pain, unspecified 01/04/2016  . Cystocele 06/06/2014  . Acid reflux 03/31/2014  . H/O malignant neoplasm of breast  03/31/2014  . H/O infectious disease 03/31/2014  . HLD (hyperlipidemia) 03/31/2014  . OP (osteoporosis) 03/31/2014  . Smoker 03/31/2014  . MRSA (methicillin resistant staph aureus) culture positive 12/04/2013  . History of DVT (deep vein thrombosis) 05/18/2013  . Cataract 01/20/2013  . 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 Urge incontinence 07/11/2011   Past Medical History:  Diagnosis Date  . Cancer ([place]) 3/07   right breast/ER/PR positive  . Cataract    and dry eye/spots on retina  . Cataract    surgery on right eye 04/12/14  . Condyloma 01/1998  . Depression   . Grave's disease   . Macular degeneration of both eyes   . Microhematuria 07/1992   negative IVP, [place]  . MRSA (methicillin resistant Staphylococcus aureus)   . Shingles     Family History  Problem Relation Age of Onset  . Hypertension Mother   . Heart disease Mother   . Breast cancer Neg Hx     Past Surgical History:  Procedure Laterality Date  . ABDOMINAL HYSTERECTOMY    . BREAST BIOPSY  1990   left  . CATARACT EXTRACTION  04/12/14   right eye  . CATARACT EXTRACTION  2/16   left eye  . CHOLECYSTECTOMY    . EYE SURGERY    . goiter  7/93   radioactive iodine  . INGUINAL HERNIA REPAIR  09/29/2011   Procedure: HERNIA REPAIR INGUINAL ADULT;  Surgeon: Rolm Bookbinder, MD;  Location: [facility];  Service: General;  Laterality: Right;  . TUBAL LIGATION     D&C   Social History   Occupational History  . Not on file  Tobacco Use  . Smoking status: Current Every Day Smoker    Packs/day: 0.50    Years: 54.00    Pack years: 27.00    Types: Cigarettes  . Smokeless tobacco: Never Used  Substance and Sexual Activity  . Alcohol use: No    Alcohol/week: 0.0 oz  . Drug use: No  . Sexual activity: Not on file

## 2017-09-26 ENCOUNTER — Encounter: Payer: Self-pay | Admitting: Internal Medicine

## 2017-09-26 ENCOUNTER — Ambulatory Visit (INDEPENDENT_AMBULATORY_CARE_PROVIDER_SITE_OTHER): Payer: Medicare Other | Admitting: Internal Medicine

## 2017-09-26 VITALS — BP 106/60 | HR 47 | Ht 61.0 in | Wt 135.4 lb

## 2017-09-26 DIAGNOSIS — R002 Palpitations: Secondary | ICD-10-CM | POA: Diagnosis not present

## 2017-09-26 DIAGNOSIS — E785 Hyperlipidemia, unspecified: Secondary | ICD-10-CM

## 2017-09-26 DIAGNOSIS — I2510 Atherosclerotic heart disease of native coronary artery without angina pectoris: Secondary | ICD-10-CM

## 2017-09-26 MED ORDER — EZETIMIBE 10 MG PO TABS: 10.0000 mg | ORAL_TABLET | Freq: Every day | ORAL | 3 refills | Status: DC

## 2017-09-26 NOTE — Patient Instructions (Signed)
Medication Instructions:   START zetia 10mg  daily OK to use tylenol arthritis or extra strength as directed for pain  Labwork:  FASTING labs to check cholesterol in 3 months  Testing/Procedures:  NONE  Follow-Up:  Your physician wants you to follow-up in: 6 months with Dr. Debara Pickett. You will receive a reminder letter in the mail two months in advance. If you don't receive a letter, please call our office to schedule the follow-up appointment.   If you need a refill on your cardiac medications before your next appointment, please call your pharmacy.  Any Other Special Instructions Will Be Listed Below (If Applicable).

## 2017-09-26 NOTE — Progress Notes (Signed)
OFFICE NOTE  Chief Complaint:  Routine follow-up  Primary Care Physician: Jani Gravel, MD  HPI:  Victoria Sims is a 77 y.o. female who is kindly referred to me for evaluation and management of recent statin intolerance. She's had problems with her digestive tract for some time and was placed on a statin recently due to some abnormal findings on a CT scan. This was a screening CT scan of her lungs as she is a smoker. It did not demonstrate any lung masses but did demonstrate coronary artery calcification. She was placed on statin therapy but to my knowledge has not had a cholesterol profile recently. She noted that the statins cause worsening constipation and stopped the medication. In addition she was sent to Dr. Irven Shelling office for an echocardiogram. This demonstrated a low normal EF of 50-55% with reportedly normal diastolic function, and trace tricuspid regurgitation with mild pulmonic regurgitation. 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 Her energy level has decreased as well. She denies any frank chest pain but does get short of breath doing certain activities. She also reports pain in her calf when walking that gets better at rest. This sometimes limits her walking long distances.  02/08/2016  Victoria Sims returns today for follow-up. She underwent a recent lipid profile in August 2017 which showed total cholesterol 285, LDL 208 and a very high LDL particle number of 20-21. April be level is 150 and LPa was elevated at 185. This indicates a marked mixed dyslipidemia. Unfortunately she had been intolerant to statins. She is currently on pravastatin 80 however I do not think this is likely to get her to her goal LDL. She also underwent stress testing which was negative for ischemia and lower extremity arterial Dopplers which showed normal ABIs and no evidence of abdominal aortic aneurysm given her  history of smoking.  08/15/2016  Victoria Sims returns for follow-up. She reports she been having some waves of symptoms a started in her stomach and go up into her chest. She does not think his reflux and does not describe it as pain or pressure. She says is very short-lived and can only go for a few seconds. She says it catches her reading and takes it away. She sometimes feels like her heart may race and I wonder if these are actually palpitations or perhaps PVCs. Nothing is noted on EKG today. She's had marked improvement in cholesterol on Lipitor 80 with LDL of 74, however she reports recent recurrent UTIs. She said after starting the medication she had foul-smelling urine has been treated with a number of antibiotics. No clear sources been found. Interestingly, this is listed as side effect of Lipitor although it was fairly uncommon (<1%).  09/19/2016  Victoria Sims was seen today in follow-up. She reports she still having waves started her stomach and come up to her throat. I don't believe the palpitations and she has these all throughout the day. While wearing a monitor she was noted to have some PACs but they were not that frequent. There is no evidence of A. fib or any other concerning arrhythmias. I do not think the symptoms are related to arrhythmia. It could be related to GI dysmotility, esophageal spasm or perhaps reflux, or or more likely anxiety which she seems to struggle with. I apprised her to consider trying the Xanax when she feels these episodes come on. With regards to her  UTIs, they have not returned off of Lipitor. Interestingly this is a listed side effect although very unlikely. Previously she was on pravastatin however with multivessel coronary artery calcification and other risk factors for goal LDL-C was less than 70 and her starting LDL was 208 therefore she was thought not to be able to reach goal based on that medication. Since she is now intolerant of atorvastatin, I would  recommend we try rosuvastatin. We'll initially start at the lowest "high potency" dose - 20 mg daily.   09/26/2017  Victoria Sims returns today for routine follow-up.  Overall she seems to be doing well.  She denies any recent palpitations.  As she is noted to have multivessel coronary calcification, her goal LDL is less than 70.  Most recently her LDL has remained above that in the mid to high 80s.  This is on maximally tolerated dose of rosuvastatin 20 mg daily.  She denies chest pain or worsening shortness of breath.   PMHx:  Past Medical History:  Diagnosis Date  . Cancer ([place]) 3/07   right breast/ER/PR positive  . Cataract    and dry eye/spots on retina  . Cataract    surgery on right eye 04/12/14  . Condyloma 01/1998  . Depression   . Grave's disease   . Macular degeneration of both eyes   . Microhematuria 07/1992   negative IVP, [place]  . MRSA (methicillin resistant Staphylococcus aureus)   . Shingles     Past Surgical History:  Procedure Laterality Date  . ABDOMINAL HYSTERECTOMY    . BREAST BIOPSY  1990   left  . CATARACT EXTRACTION  04/12/14   right eye  . CATARACT EXTRACTION  2/16   left eye  . CHOLECYSTECTOMY    . EYE SURGERY    . goiter  7/93   radioactive iodine  . INGUINAL HERNIA REPAIR  09/29/2011   Procedure: HERNIA REPAIR INGUINAL ADULT;  Surgeon: Rolm Bookbinder, MD;  Location: [facility];  Service: General;  Laterality: Right;  . TUBAL LIGATION     D&C    FAMHx:  Family History  Problem Relation Age of Onset  . Hypertension Mother   . Heart disease Mother   . Breast cancer Neg Hx     SOCHx:   reports that she has been smoking cigarettes.  She has a 27.00 pack-year smoking history. She has never used smokeless tobacco. She reports that she does not drink alcohol or use drugs.  ALLERGIES:  Allergies  Allergen Reactions  . Atorvastatin Other (See Comments)    UTIs  . Gabapentin Nausea And Vomiting  . Macrobid [Nitrofurantoin Macrocrystal] Other (See  Comments)    hallucination  . Sulfa Antibiotics Rash    ROS: Pertinent items noted in HPI and remainder of comprehensive ROS otherwise negative.  HOME MEDS: Current Outpatient Medications  Medication Sig Dispense Refill  . ALPRAZolam (XANAX) 1 MG tablet Take 1 tablet (1 mg total) by mouth at bedtime as needed for sleep. 30 tablet 1  . aspirin 81 MG EC tablet Take 81 mg by mouth daily.  99  . bisacodyl (DULCOLAX) 5 MG EC tablet Take 5 mg by mouth daily as needed for moderate constipation.    . Calcium-Vitamin D (CALTRATE 600 PLUS-VIT D PO) Take 1 tablet by mouth daily.    . Cholecalciferol (D3-1000) 1000 UNITS capsule Take 1,000 Units by mouth daily.    Marland Kitchen CLONAZEPAM PO Take by mouth as needed.    Marland Kitchen escitalopram (LEXAPRO) 20 MG  tablet Take 40 mg by mouth daily.    . furosemide (LASIX) 40 MG tablet Take 40 mg by mouth daily.    Marland Kitchen levothyroxine (SYNTHROID, LEVOTHROID) 100 MCG tablet Take 100 mcg by mouth daily.    . mupirocin ointment (BACTROBAN) 2 % APPLY 1 APPLICATION INTO NOSE TWICE DAILY 22 g 0  . pantoprazole (PROTONIX) 40 MG tablet Take by mouth.    Marland Kitchen POTASSIUM PO Take 2 Doses by mouth daily. Liquid. Takes 2 tbsp daily.    . rosuvastatin (CRESTOR) 20 MG tablet TAKE 1 TABLET BY MOUTH daily 90 tablet 1   No current facility-administered medications for this visit.     LABS/IMAGING: No results found for this or any previous visit (from the past 48 hour(s)). No results found.  WEIGHTS: Wt Readings from Last 3 Encounters:  09/26/17 135 lb 6.4 oz (61.4 kg)  09/18/17 133 lb (60.3 kg)  05/10/17 130 lb 14.4 oz (59.4 kg)    VITALS: BP 106/60   Pulse (!) 47   Ht 5\' 1"  (1.549 m)   Wt 135 lb 6.4 oz (61.4 kg)   BMI 25.58 kg/m   EXAM: General appearance: alert and no distress Neck: no carotid bruit, no JVD and thyroid not enlarged, symmetric, no tenderness/mass/nodules Lungs: clear to auscultation bilaterally Heart: regular rate and rhythm Abdomen: soft, non-tender; bowel  sounds normal; no masses,  no organomegaly Extremities: extremities normal, atraumatic, no cyanosis or edema Pulses: 2+ and symmetric Skin: Skin color, texture, turgor normal. No rashes or lesions Neurologic: Grossly normal Psych: Pleasant  EKG: Sinus bradycardia 47-personally reviewed  ASSESSMENT: 1. Palpitations 2. Fatigue and dyspnea - improved 3. Multivessel coronary artery calcium by CT 4. Dyslipidemia-not at goal less than 70 5. Low normal LVEF of 50% by echo in June 2017 6. Claudication  PLAN: 1.   Victoria Sims returns and remains asymptomatic with regards to palpitations.  She has multivessel coronary artery calcifications.  Her goal LDL is less than 70 which she is not at.  She is on max elevated dose of rosuvastatin 20 mg.  We will add ezetimibe 10 mg daily to her regimen and repeat a lipid profile in about 3 months.  Continue to encourage ambulation for fatigue, dyspnea and symptoms of claudication.  Follow-up in 6 months or sooner as necessary.  Pixie Casino, MD, [facility], [place] Director of the [facility] Lipid Disorders &  Card[facility] Diplomate of the American Board of Clinical Lipidology Attending Cardiologist  Direct Dial: [phone]  Fax: [phone]  Website:  [email or web address] 09/26/2017, 10:45 AM

## 2017-10-02 ENCOUNTER — Ambulatory Visit (INDEPENDENT_AMBULATORY_CARE_PROVIDER_SITE_OTHER): Payer: Medicare Other | Admitting: Licensed Clinical Social Worker

## 2017-10-02 DIAGNOSIS — F3341 Major depressive disorder, recurrent, in partial remission: Secondary | ICD-10-CM | POA: Diagnosis not present

## 2017-10-04 ENCOUNTER — Other Ambulatory Visit: Payer: Self-pay | Admitting: *Deleted

## 2017-10-04 DIAGNOSIS — E785 Hyperlipidemia, unspecified: Secondary | ICD-10-CM

## 2017-10-04 MED ORDER — ROSUVASTATIN CALCIUM 20 MG PO TABS: 20.0000 mg | ORAL_TABLET | Freq: Every day | ORAL | 1 refills | Status: DC

## 2017-10-07 ENCOUNTER — Telehealth: Payer: Self-pay | Admitting: Internal Medicine

## 2017-10-07 NOTE — Telephone Encounter (Signed)
Per recollection of office visit, initial med list did not show liquid potassium which patient takes. She noted this needed to be changed, appears to have been updated in error. Spoke with patient and confirmed this to be accurate. Advised that she take her potassium as she has been, 1 Tbsp QD.

## 2017-10-07 NOTE — Telephone Encounter (Signed)
New Message   Pt c/o medication issue:  1. Name of Medication:  POTASSIUM PO  2. How are you currently taking this medication (dosage and times per day)? Take 1 dose by mouth daily  3. Are you having a reaction (difficulty breathing--STAT)?    4. What is your medication issue? Patient is calling because she has always taken one dose by mouth daily. But she is looking at her instructions from her 09/26/2017 appointment and it says to take 2 doses bvy mouth daily. She wants to know why the change in dosage. Please call.

## 2017-10-07 NOTE — Telephone Encounter (Signed)
Spoke with Pt who states on her AVS it shows that she is to take 2tbsp of Potassium daily. Pt reports that she has always just taking 1 tbsp and was calling for clarification. Instructed pt that a message would be routed to Dr. Debara Pickett for clarification as there is no indication that there were any changes made to the dose based on his office notes.

## 2017-10-16 ENCOUNTER — Ambulatory Visit (INDEPENDENT_AMBULATORY_CARE_PROVIDER_SITE_OTHER): Payer: Medicare Other | Admitting: Licensed Clinical Social Worker

## 2017-10-16 DIAGNOSIS — F3341 Major depressive disorder, recurrent, in partial remission: Secondary | ICD-10-CM | POA: Diagnosis not present

## 2017-10-30 ENCOUNTER — Ambulatory Visit (INDEPENDENT_AMBULATORY_CARE_PROVIDER_SITE_OTHER): Payer: Medicare Other | Admitting: Licensed Clinical Social Worker

## 2017-10-30 DIAGNOSIS — F3341 Major depressive disorder, recurrent, in partial remission: Secondary | ICD-10-CM

## 2017-11-13 ENCOUNTER — Ambulatory Visit (INDEPENDENT_AMBULATORY_CARE_PROVIDER_SITE_OTHER): Payer: Medicare Other | Admitting: Licensed Clinical Social Worker

## 2017-11-13 DIAGNOSIS — F3341 Major depressive disorder, recurrent, in partial remission: Secondary | ICD-10-CM | POA: Diagnosis not present

## 2017-11-27 ENCOUNTER — Ambulatory Visit (INDEPENDENT_AMBULATORY_CARE_PROVIDER_SITE_OTHER): Payer: Medicare Other | Admitting: Licensed Clinical Social Worker

## 2017-11-27 DIAGNOSIS — F3341 Major depressive disorder, recurrent, in partial remission: Secondary | ICD-10-CM | POA: Diagnosis not present

## 2017-12-05 DIAGNOSIS — N183 Chronic kidney disease, stage 3 (moderate): Secondary | ICD-10-CM | POA: Diagnosis not present

## 2017-12-05 DIAGNOSIS — E039 Hypothyroidism, unspecified: Secondary | ICD-10-CM | POA: Diagnosis not present

## 2017-12-05 DIAGNOSIS — E785 Hyperlipidemia, unspecified: Secondary | ICD-10-CM | POA: Diagnosis not present

## 2017-12-05 DIAGNOSIS — N390 Urinary tract infection, site not specified: Secondary | ICD-10-CM | POA: Diagnosis not present

## 2017-12-11 ENCOUNTER — Ambulatory Visit (INDEPENDENT_AMBULATORY_CARE_PROVIDER_SITE_OTHER): Payer: Medicare Other | Admitting: Licensed Clinical Social Worker

## 2017-12-11 DIAGNOSIS — F3341 Major depressive disorder, recurrent, in partial remission: Secondary | ICD-10-CM | POA: Diagnosis not present

## 2017-12-12 DIAGNOSIS — F339 Major depressive disorder, recurrent, unspecified: Secondary | ICD-10-CM | POA: Diagnosis not present

## 2017-12-12 DIAGNOSIS — E785 Hyperlipidemia, unspecified: Secondary | ICD-10-CM | POA: Diagnosis not present

## 2017-12-12 DIAGNOSIS — N390 Urinary tract infection, site not specified: Secondary | ICD-10-CM | POA: Diagnosis not present

## 2017-12-12 DIAGNOSIS — R319 Hematuria, unspecified: Secondary | ICD-10-CM | POA: Diagnosis not present

## 2017-12-12 DIAGNOSIS — K219 Gastro-esophageal reflux disease without esophagitis: Secondary | ICD-10-CM | POA: Diagnosis not present

## 2017-12-12 DIAGNOSIS — E039 Hypothyroidism, unspecified: Secondary | ICD-10-CM | POA: Diagnosis not present

## 2017-12-18 ENCOUNTER — Telehealth: Payer: Self-pay | Admitting: Internal Medicine

## 2017-12-18 NOTE — Telephone Encounter (Signed)
Patient aware of MD advice.  ?

## 2017-12-18 NOTE — Telephone Encounter (Signed)
New Message          Patient would like a  Call back concerning her recall letter. She wants an appt in August and nothing is available. Patient is not happy. Pls advise

## 2017-12-18 NOTE — Telephone Encounter (Signed)
Cholesterol much improved- now at goal. Continue zetia.  Dr. Lemmie Evens

## 2017-12-18 NOTE — Telephone Encounter (Signed)
Returned call to patient  Explained that she is due for labs in August and an appointment in November She had labs done by Dr. Shelia Media containing lipid panel (scanned to epic) Will route to MD to review  Scheduled for ROV Nov 6 @ 0800

## 2017-12-25 ENCOUNTER — Other Ambulatory Visit: Payer: Self-pay | Admitting: Internal Medicine

## 2017-12-25 ENCOUNTER — Ambulatory Visit (INDEPENDENT_AMBULATORY_CARE_PROVIDER_SITE_OTHER): Payer: Medicare Other | Admitting: Licensed Clinical Social Worker

## 2017-12-25 DIAGNOSIS — F3341 Major depressive disorder, recurrent, in partial remission: Secondary | ICD-10-CM | POA: Diagnosis not present

## 2017-12-25 DIAGNOSIS — E785 Hyperlipidemia, unspecified: Secondary | ICD-10-CM

## 2017-12-25 DIAGNOSIS — N183 Chronic kidney disease, stage 3 (moderate): Secondary | ICD-10-CM | POA: Diagnosis not present

## 2017-12-25 DIAGNOSIS — N390 Urinary tract infection, site not specified: Secondary | ICD-10-CM | POA: Diagnosis not present

## 2017-12-25 NOTE — Telephone Encounter (Signed)
Rx sent to pharmacy   

## 2018-01-08 ENCOUNTER — Ambulatory Visit (INDEPENDENT_AMBULATORY_CARE_PROVIDER_SITE_OTHER): Payer: Medicare Other | Admitting: Licensed Clinical Social Worker

## 2018-01-08 DIAGNOSIS — F3341 Major depressive disorder, recurrent, in partial remission: Secondary | ICD-10-CM | POA: Diagnosis not present

## 2018-01-22 ENCOUNTER — Ambulatory Visit (INDEPENDENT_AMBULATORY_CARE_PROVIDER_SITE_OTHER): Payer: Medicare Other | Admitting: Licensed Clinical Social Worker

## 2018-01-22 DIAGNOSIS — F3341 Major depressive disorder, recurrent, in partial remission: Secondary | ICD-10-CM

## 2018-01-28 ENCOUNTER — Ambulatory Visit: Payer: Medicare Other | Admitting: Obstetrics & Gynecology

## 2018-01-28 ENCOUNTER — Telehealth: Payer: Self-pay | Admitting: Obstetrics & Gynecology

## 2018-01-28 ENCOUNTER — Encounter: Payer: Self-pay | Admitting: Obstetrics & Gynecology

## 2018-01-28 NOTE — Telephone Encounter (Signed)
Patient's car broke down on the way to today's 3:00 appointment and she will not be able to make it by 3:15.

## 2018-01-28 NOTE — Telephone Encounter (Signed)
Appt scheduled 02/06/18 @2 :45pm.

## 2018-01-28 NOTE — Progress Notes (Deleted)
77 y.o. G0P0000 Married White or Caucasian female here for annual exam.    No LMP recorded. Patient has had a hysterectomy.          Sexually active: {yes no:314532}  The current method of family planning is status post hysterectomy.    Exercising: {yes no:314532}  {types:19826} Smoker:  {YES NO:22349}  Health Maintenance: Pap:  07/26/16 Neg  History of abnormal Pap:  {YES NO:22349} MMG:  09/18/17 BIRADS1:neg  Colonoscopy:  *** BMD:   *** TDaP:  *** Pneumonia vaccine(s):  *** Shingrix:   *** Hep C testing: *** Screening Labs: ***, Hb today: ***, Urine today: ***   reports that she has been smoking cigarettes. She has a 27.00 pack-year smoking history. She has never used smokeless tobacco. She reports that she does not drink alcohol or use drugs.  Past Medical History:  Diagnosis Date  . Cancer ([place]) 3/07   right breast/ER/PR positive  . Cataract    and dry eye/spots on retina  . Cataract    surgery on right eye 04/12/14  . Condyloma 01/1998  . Depression   . Grave's disease   . Macular degeneration of both eyes   . Microhematuria 07/1992   negative IVP, [place]  . MRSA (methicillin resistant Staphylococcus aureus)   . Shingles     Past Surgical History:  Procedure Laterality Date  . ABDOMINAL HYSTERECTOMY    . BREAST BIOPSY  1990   left  . CATARACT EXTRACTION  04/12/14   right eye  . CATARACT EXTRACTION  2/16   left eye  . CHOLECYSTECTOMY    . EYE SURGERY    . goiter  7/93   radioactive iodine  . INGUINAL HERNIA REPAIR  09/29/2011   Procedure: HERNIA REPAIR INGUINAL ADULT;  Surgeon: Rolm Bookbinder, MD;  Location: [facility];  Service: General;  Laterality: Right;  . TUBAL LIGATION     D&C    Current Outpatient Medications  Medication Sig Dispense Refill  . ALPRAZolam (XANAX) 1 MG tablet Take 1 tablet (1 mg total) by mouth at bedtime as needed for sleep. 30 tablet 1  . aspirin 81 MG EC tablet Take 81 mg by mouth daily.  99  . bisacodyl (DULCOLAX) 5 MG EC tablet Take 5  mg by mouth daily as needed for moderate constipation.    . Calcium-Vitamin D (CALTRATE 600 PLUS-VIT D PO) Take 1 tablet by mouth daily.    . Cholecalciferol (D3-1000) 1000 UNITS capsule Take 1,000 Units by mouth daily.    Marland Kitchen CLONAZEPAM PO Take by mouth as needed.    Marland Kitchen escitalopram (LEXAPRO) 20 MG tablet Take 40 mg by mouth daily.    Marland Kitchen ezetimibe (ZETIA) 10 MG tablet Take 1 tablet (10 mg total) by mouth daily. 90 tablet 3  . furosemide (LASIX) 40 MG tablet Take 40 mg by mouth daily.    Marland Kitchen levothyroxine (SYNTHROID, LEVOTHROID) 100 MCG tablet Take 100 mcg by mouth daily.    . mupirocin ointment (BACTROBAN) 2 % APPLY 1 APPLICATION INTO NOSE TWICE DAILY 22 g 0  . pantoprazole (PROTONIX) 40 MG tablet Take by mouth.    Marland Kitchen POTASSIUM PO Take 1 Tbsp by mouth daily    . rosuvastatin (CRESTOR) 20 MG tablet Take 1 tablet (20 mg total) by mouth daily. 90 tablet 1  . rosuvastatin (CRESTOR) 20 MG tablet TAKE 1 TABLET BY MOUTH daily 90 tablet 0   No current facility-administered medications for this visit.     Family History  Problem  Relation Age of Onset  . Hypertension Mother   . Heart disease Mother   . Breast cancer Neg Hx     Review of Systems  Exam:   There were no vitals taken for this visit.  Height:      Ht Readings from Last 3 Encounters:  09/26/17 5\' 1"  (1.549 m)  09/18/17 5\' 1"  (1.549 m)  05/10/17 5\' 1"  (1.549 m)    General appearance: alert, cooperative and appears stated age Head: Normocephalic, without obvious abnormality, atraumatic Neck: no adenopathy, supple, symmetrical, trachea midline and thyroid {EXAM; THYROID:18604} Lungs: clear to auscultation bilaterally Breasts: {Exam; breast:13139::"normal appearance, no masses or tenderness"} Heart: regular rate and rhythm Abdomen: soft, non-tender; bowel sounds normal; no masses,  no organomegaly Extremities: extremities normal, atraumatic, no cyanosis or edema Skin: Skin color, texture, turgor normal. No rashes or lesions Lymph  nodes: Cervical, supraclavicular, and axillary nodes normal. No abnormal inguinal nodes palpated Neurologic: Grossly normal   Pelvic: External genitalia:  no lesions              Urethra:  normal appearing urethra with no masses, tenderness or lesions              Bartholins and Skenes: normal                 Vagina: normal appearing vagina with normal color and discharge, no lesions              Cervix: {exam; cervix:14595}              Pap taken: {yes no:314532} Bimanual Exam:  Uterus:  {exam; uterus:12215}              Adnexa: {exam; adnexa:12223}               Rectovaginal: Confirms               Anus:  normal sphincter tone, no lesions  Chaperone was present for exam.  A:  Well Woman with normal exam  P:   {plan; gyn:5269::"mammogram","pap smear","return annually or prn"}

## 2018-01-28 NOTE — Telephone Encounter (Signed)
Please call to reschedule.

## 2018-02-05 ENCOUNTER — Ambulatory Visit (INDEPENDENT_AMBULATORY_CARE_PROVIDER_SITE_OTHER): Payer: Medicare Other | Admitting: Licensed Clinical Social Worker

## 2018-02-05 DIAGNOSIS — F3341 Major depressive disorder, recurrent, in partial remission: Secondary | ICD-10-CM | POA: Diagnosis not present

## 2018-02-05 DIAGNOSIS — E039 Hypothyroidism, unspecified: Secondary | ICD-10-CM | POA: Diagnosis not present

## 2018-02-06 ENCOUNTER — Ambulatory Visit (INDEPENDENT_AMBULATORY_CARE_PROVIDER_SITE_OTHER): Payer: Medicare Other | Admitting: Obstetrics & Gynecology

## 2018-02-06 ENCOUNTER — Encounter: Payer: Self-pay | Admitting: Obstetrics & Gynecology

## 2018-02-06 VITALS — BP 88/60 | HR 72 | Resp 18 | Ht 60.75 in | Wt 137.2 lb

## 2018-02-06 DIAGNOSIS — N3090 Cystitis, unspecified without hematuria: Secondary | ICD-10-CM | POA: Diagnosis not present

## 2018-02-06 DIAGNOSIS — I2510 Atherosclerotic heart disease of native coronary artery without angina pectoris: Secondary | ICD-10-CM | POA: Diagnosis not present

## 2018-02-06 DIAGNOSIS — Z0000 Encounter for general adult medical examination without abnormal findings: Secondary | ICD-10-CM | POA: Diagnosis not present

## 2018-02-06 LAB — POCT URINALYSIS DIPSTICK
BILIRUBIN UA: NEGATIVE
Glucose, UA: NEGATIVE
Ketones, UA: NEGATIVE
NITRITE UA: NEGATIVE
PH UA: 5 (ref 5.0–8.0)
PROTEIN UA: NEGATIVE
UROBILINOGEN UA: 0.2 U/dL

## 2018-02-06 MED ORDER — AMOXICILLIN-POT CLAVULANATE 875-125 MG PO TABS: 1.0000 | ORAL_TABLET | Freq: Two times a day (BID) | ORAL | 0 refills | Status: DC

## 2018-02-06 NOTE — Progress Notes (Signed)
77 y.o. Victoria Sims Married White or Caucasian female here for complaint of low back pain and pelvic pressure that has been going on for a few weeks.  Pain is achy and low at times.  Denies vaginal bleeding.  Having increased urinary urgency as well.  Denies fever.    PCP:  Dr. Celesta Aver.  [facility].  Blood work was done yesterday for thyroid recheck.  Cardiologist:  Dr. Debara Pickett.  Was seen 09/26/17.  Has follow up in 6 months.  No LMP recorded. Patient has had a hysterectomy.          Sexually active: Yes.    The current method of family planning is post menopausal status.    Exercising: No.   Smoker:  yes  Health Maintenance: Pap:  07/26/16 Neg  History of abnormal Pap:  no MMG:  09/18/17 BIRADS1:neg  Colonoscopy:  Due  BMD:   03/02/08 Osteopenia  TDaP:  PCP  Pneumonia vaccine(s):  done Shingrix: had allergic reaction to Zostavax.  Declines having new one Hep C testing:  Not indicated Screening Labs: PCP  UA: WBC=small, RBC= Large   reports that she has been smoking cigarettes. She has a 27.00 pack-year smoking history. She has never used smokeless tobacco. She reports that she does not drink alcohol or use drugs.  Past Medical History:  Diagnosis Date  . Cancer ([place]) 3/07   right breast/ER/PR positive  . Cataract    and dry eye/spots on retina  . Cataract    surgery on right eye 04/12/14  . Condyloma 01/1998  . Depression   . Grave's disease   . Macular degeneration of both eyes   . Microhematuria 07/1992   negative IVP, [place]  . MRSA (methicillin resistant Staphylococcus aureus)   . Shingles     Past Surgical History:  Procedure Laterality Date  . ABDOMINAL HYSTERECTOMY    . BREAST BIOPSY  1990   left  . CATARACT EXTRACTION  04/12/14   right eye  . CATARACT EXTRACTION  2/16   left eye  . CHOLECYSTECTOMY    . EYE SURGERY    . goiter  7/93   radioactive iodine  . INGUINAL HERNIA REPAIR  09/29/2011   Procedure: HERNIA REPAIR INGUINAL ADULT;  Surgeon: Rolm Bookbinder, MD;  Location: [facility];  Service: General;  Laterality: Right;  . TUBAL LIGATION     D&C    Current Outpatient Medications  Medication Sig Dispense Refill  . ALPRAZolam (XANAX) 1 MG tablet Take 1 tablet (1 mg total) by mouth at bedtime as needed for sleep. 30 tablet 1  . aspirin 81 MG EC tablet Take 81 mg by mouth daily.  99  . bisacodyl (DULCOLAX) 5 MG EC tablet Take 5 mg by mouth daily as needed for moderate constipation.    . Calcium-Vitamin D (CALTRATE 600 PLUS-VIT D PO) Take 1 tablet by mouth daily.    . Cholecalciferol (D3-1000) 1000 UNITS capsule Take 1,000 Units by mouth daily.    Marland Kitchen CLONAZEPAM PO Take by mouth as needed.    Marland Kitchen escitalopram (LEXAPRO) 20 MG tablet Take 40 mg by mouth daily.    . furosemide (LASIX) 40 MG tablet Take 40 mg by mouth daily.    Marland Kitchen levothyroxine (SYNTHROID, LEVOTHROID) 88 MCG tablet Take 88 mcg by mouth daily.  3  . mupirocin ointment (BACTROBAN) 2 % APPLY 1 APPLICATION INTO NOSE TWICE DAILY 22 g 0  . pantoprazole (PROTONIX) 40 MG tablet Take by mouth.    Marland Kitchen  POTASSIUM PO Take 1 Tbsp by mouth daily    . rosuvastatin (CRESTOR) 20 MG tablet TAKE 1 TABLET BY MOUTH daily 90 tablet 0  . ezetimibe (ZETIA) 10 MG tablet Take 1 tablet (10 mg total) by mouth daily. 90 tablet 3   No current facility-administered medications for this visit.     Family History  Problem Relation Age of Onset  . Hypertension Mother   . Heart disease Mother   . Breast cancer Neg Hx     Review of Systems  Genitourinary: Positive for frequency.  Psychiatric/Behavioral:       Depression   All other systems reviewed and are negative.   Exam:   BP (!) 88/60 (BP Location: Right Arm, Patient Position: Sitting, Cuff Size: Normal)   Pulse 72   Resp 18   Ht 5' 0.75" (1.543 m)   Wt 137 lb 3.2 oz (62.2 kg)   BMI 26.14 kg/m   Height: 5' 0.75" (154.3 cm)  Ht Readings from Last 3 Encounters:  02/06/18 5' 0.75" (1.543 m)  09/26/17 5\' 1"  (1.549 m)  09/18/17 5\' 1"  (1.549 m)     General appearance: alert, cooperative and appears stated age Head: Normocephalic, without obvious abnormality, atraumatic Breasts: normal appearance, no masses or tenderness (done per request) Heart: regular rate and rhythm Abdomen: soft, non-tender; bowel sounds normal; no masses,  no organomegaly Extremities: extremities normal, atraumatic, no cyanosis or edema Lymph nodes: Cervical, supraclavicular, and axillary nodes normal. No abnormal inguinal nodes palpated Neurologic: Grossly normal  Pelvic: External genitalia:  no lesions              Urethra:  normal appearing urethra with no masses, tenderness or lesions              Bartholins and Skenes: normal                 Vagina: normal appearing vagina with normal color and discharge, no lesions, midline cystocele present              Cervix: absent              Pap taken: No. Bimanual Exam:  Uterus:  uterus absent              Adnexa: no mass, fullness, tenderness               Rectovaginal: Confirms               Anus:  normal sphincter tone, no lesions  Chaperone was present for exam.  A:  Cystitis?  Pelvic pressure and urinary urgency Midline cystocele, not using a pessary at this time H/o multiple drug resistant UTI in the past  P:   Augmentin 875/125 bid x 5 days to pharmacy Urine micro and culture pending.

## 2018-02-07 LAB — URINALYSIS, MICROSCOPIC ONLY
Bacteria, UA: NONE SEEN
Epithelial Cells (non renal): >10 /hpf — AB (ref 0–10)

## 2018-02-07 LAB — URINE CULTURE

## 2018-02-10 ENCOUNTER — Telehealth: Payer: Self-pay | Admitting: Obstetrics & Gynecology

## 2018-02-10 NOTE — Telephone Encounter (Signed)
Return call to patient. She states she is feeling well. Voided clear yellow urine. Drinking water. Took medication this morning as directed.   She is advised to return call or seek emergency care if any concerns or if condition does not continue to improve. Pt verbalized understanding.   Routing to Dr. Sabra Heck to review.

## 2018-02-10 NOTE — Telephone Encounter (Signed)
Patient called and stated that she was being treated for a UTI. Patient stated that yesterday when she urinated, her urine was "almost black." Patient stated that she has not tried to urinate this morning.

## 2018-02-10 NOTE — Telephone Encounter (Signed)
Spoke with patient. She states she is taking Augmentin as directed. Drinking water "all the time." Feels better after starting antibiotics. No fevers, chills, back pain. Denies hx of kidney stones.   She states yesterday urine was dark and asks if it was blood because it was "almost black." Advised there was blood in her urine on results.   She states she has not felt the need to void this morning since waking up. Advised to push fluid encouraged water to not hold bladder, call back with results of void. If still having very dark urine may need to seek emergency care for evaluation.   Pt will call back with update.

## 2018-02-10 NOTE — Telephone Encounter (Signed)
Urine testing results routed to you.  Urine culture was negative.  U/a in office did show blood.  She needs a cath u/a.  If blood is still present, needs urology follow up.  Thanks.

## 2018-02-11 NOTE — Telephone Encounter (Signed)
Message left to return call to [place] at [phone].  Needs cath UA appointment with Dr. Sabra Heck

## 2018-02-11 NOTE — Telephone Encounter (Signed)
Spoke with patient and she states urine continues to be clear. Feels okay.  Agreeable to office visit for cath UA. Scheduled for Thursday 02/13/18 with Dr. Sabra Heck.  Will return call with any concerns prior to appointment.  Encounter closed.

## 2018-02-13 ENCOUNTER — Ambulatory Visit (INDEPENDENT_AMBULATORY_CARE_PROVIDER_SITE_OTHER): Payer: Medicare Other | Admitting: Obstetrics & Gynecology

## 2018-02-13 ENCOUNTER — Encounter: Payer: Self-pay | Admitting: Obstetrics & Gynecology

## 2018-02-13 ENCOUNTER — Other Ambulatory Visit: Payer: Self-pay

## 2018-02-13 VITALS — BP 110/72 | HR 68 | Temp 98.5°F | Resp 16 | Wt 138.0 lb

## 2018-02-13 DIAGNOSIS — I2510 Atherosclerotic heart disease of native coronary artery without angina pectoris: Secondary | ICD-10-CM | POA: Diagnosis not present

## 2018-02-13 DIAGNOSIS — R3129 Other microscopic hematuria: Secondary | ICD-10-CM | POA: Diagnosis not present

## 2018-02-13 NOTE — Progress Notes (Signed)
GYNECOLOGY  VISIT  CC:   Cath u/a  HPI: 77 y.o. G0P0000 Married White or Caucasian female here for U/A Cath 02/06/18 when pt has symptoms of cystitis.  She was treated with antibiotics but urine culture was ultimately negative.  Urine micro showed the following:  WBC, UA 0 - 5 /hpf 11-30Abnormal    RBC, UA 0 - 2 /hpf 11-30Abnormal    Epithelial Cells (non renal) 0 - 10 /hpf >10Abnormal    Casts None seen /lpf PresentAbnormal    Cast Type N/A Hyaline casts   Crystals N/A PresentAbnormal    Crystal Type N/A Calcium Oxalate   Mucus, UA Not Estab. Present   Bacteria, UA None seen/Few None seen    Due to this abnormal finding, cath u/a recommended.  Above reviewed with pt.  Symptoms of cystitis have resolved.  She finished her antibiotics.  Does not feel urine is dark any longer as well.  GYNECOLOGIC HISTORY: No LMP recorded. Patient has had a hysterectomy. Contraception: hyserectomy Menopausal hormone therapy: none  Patient Active Problem List   Diagnosis Date Noted  . Bilateral primary osteoarthritis of knee 03/20/2017  . Chest discomfort 09/19/2016  . Heart palpitations 08/15/2016  . ASCVD (arteriosclerotic cardiovascular disease) 02/08/2016  . Claudication ([place]) 01/04/2016  . Dyslipidemia 01/04/2016  . Calcification of coronary artery 01/04/2016  . Chest pain, unspecified 01/04/2016  . Cystocele 06/06/2014  . Acid reflux 03/31/2014  . H/O malignant neoplasm of breast 03/31/2014  . H/O infectious disease 03/31/2014  . HLD (hyperlipidemia) 03/31/2014  . OP (osteoporosis) 03/31/2014  . Smoker 03/31/2014  . MRSA (methicillin resistant staph aureus) culture positive 12/04/2013  . History of DVT (deep vein thrombosis) 05/18/2013  . Cataract 01/20/2013  . 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 Urge incontinence 07/11/2011    Past Medical History:  Diagnosis Date  . Cancer ([place]) 3/07   right breast/ER/PR positive  . Cataract    and dry eye/spots on retina  . Cataract    surgery on  right eye 04/12/14  . Condyloma 01/1998  . Depression   . Grave's disease   . Macular degeneration of both eyes   . Microhematuria 07/1992   negative IVP, [place]  . MRSA (methicillin resistant Staphylococcus aureus)   . Shingles     Past Surgical History:  Procedure Laterality Date  . ABDOMINAL HYSTERECTOMY    . BREAST BIOPSY  1990   left  . CATARACT EXTRACTION  04/12/14   right eye  . CATARACT EXTRACTION  2/16   left eye  . CHOLECYSTECTOMY    . EYE SURGERY    . goiter  7/93   radioactive iodine  . INGUINAL HERNIA REPAIR  09/29/2011   Procedure: HERNIA REPAIR INGUINAL ADULT;  Surgeon: Rolm Bookbinder, MD;  Location: [facility];  Service: General;  Laterality: Right;  . TUBAL LIGATION     D&C    MEDS:   Current Outpatient Medications on File Prior to Visit  Medication Sig Dispense Refill  . ALPRAZolam (XANAX) 1 MG tablet Take 1 tablet (1 mg total) by mouth at bedtime as needed for sleep. 30 tablet 1  . aspirin 81 MG EC tablet Take 81 mg by mouth daily.  99  . bisacodyl (DULCOLAX) 5 MG EC tablet Take 5 mg by mouth daily as needed for moderate constipation.    . Calcium-Vitamin D (CALTRATE 600 PLUS-VIT D PO) Take 1 tablet by mouth daily.    . Cholecalciferol (D3-1000) 1000 UNITS capsule Take 1,000 Units by mouth daily.    Marland Kitchen  escitalopram (LEXAPRO) 20 MG tablet Take 40 mg by mouth daily.    Marland Kitchen ezetimibe (ZETIA) 10 MG tablet Take 10 mg by mouth daily.    . furosemide (LASIX) 40 MG tablet Take 40 mg by mouth daily.    Marland Kitchen levothyroxine (SYNTHROID, LEVOTHROID) 88 MCG tablet Take 88 mcg by mouth daily.  3  . mupirocin ointment (BACTROBAN) 2 % APPLY 1 APPLICATION INTO NOSE TWICE DAILY 22 g 0  . pantoprazole (PROTONIX) 40 MG tablet Take by mouth.    Marland Kitchen POTASSIUM PO Take 1 Tbsp by mouth daily    . rosuvastatin (CRESTOR) 20 MG tablet TAKE 1 TABLET BY MOUTH daily 90 tablet 0   No current facility-administered medications on file prior to visit.     ALLERGIES: Atorvastatin; Gabapentin; Macrobid  [nitrofurantoin macrocrystal]; and Sulfa antibiotics  Family History  Problem Relation Age of Onset  . Hypertension Mother   . Heart disease Mother   . Breast cancer Neg Hx     SH:  Married, smokes daily  Review of Systems  Constitutional: Negative.   HENT: Negative.   Eyes: Negative.   Respiratory: Negative.   Cardiovascular: Negative.   Gastrointestinal: Negative.   Endocrine: Negative.   Genitourinary: Negative.   Musculoskeletal: Positive for back pain.  Skin: Negative.   Allergic/Immunologic: Negative.   Neurological: Negative.   Hematological: Negative.   Psychiatric/Behavioral: Negative.     PHYSICAL EXAMINATION:    BP 110/72   Pulse 68   Resp 16   Wt 138 lb (62.6 kg)   BMI 26.29 kg/m     Physical Exam  Constitutional: She is oriented to person, place, and time. She appears well-developed and well-nourished.  Genitourinary: Vagina normal. There is no rash, tenderness, lesion or injury on the right labia. There is no rash, tenderness, lesion or injury on the left labia.  Lymphadenopathy:       Right: No inguinal adenopathy present.       Left: No inguinal adenopathy present.  Neurological: She is alert and oriented to person, place, and time.  Skin: Skin is warm and dry.  Psychiatric: She has a normal mood and affect.   Cath u/a performed with sterile technique.  Urine was very clear.    Chaperone was present for exam.  Assessment: Microscopic hematuria WBCs in urine Prior cystitis symptoms with negative urine culture  Plan: Cath u/a pending.  If abnormal, will refer to urology.

## 2018-02-14 LAB — URINALYSIS, MICROSCOPIC ONLY
Casts: NONE SEEN /lpf
EPITHELIAL CELLS (NON RENAL): NONE SEEN /HPF (ref 0–10)

## 2018-02-14 LAB — SPECIMEN STATUS REPORT

## 2018-02-19 ENCOUNTER — Ambulatory Visit (INDEPENDENT_AMBULATORY_CARE_PROVIDER_SITE_OTHER): Payer: Medicare Other | Admitting: Licensed Clinical Social Worker

## 2018-02-19 DIAGNOSIS — F3341 Major depressive disorder, recurrent, in partial remission: Secondary | ICD-10-CM

## 2018-03-05 ENCOUNTER — Ambulatory Visit (INDEPENDENT_AMBULATORY_CARE_PROVIDER_SITE_OTHER): Payer: Medicare Other | Admitting: Licensed Clinical Social Worker

## 2018-03-05 DIAGNOSIS — F3341 Major depressive disorder, recurrent, in partial remission: Secondary | ICD-10-CM | POA: Diagnosis not present

## 2018-03-10 ENCOUNTER — Other Ambulatory Visit: Payer: Self-pay | Admitting: Acute Care

## 2018-03-10 DIAGNOSIS — Z122 Encounter for screening for malignant neoplasm of respiratory organs: Secondary | ICD-10-CM

## 2018-03-10 DIAGNOSIS — F17210 Nicotine dependence, cigarettes, uncomplicated: Principal | ICD-10-CM

## 2018-03-10 DIAGNOSIS — Z87891 Personal history of nicotine dependence: Secondary | ICD-10-CM

## 2018-03-12 ENCOUNTER — Ambulatory Visit (INDEPENDENT_AMBULATORY_CARE_PROVIDER_SITE_OTHER): Payer: Medicare Other | Admitting: Internal Medicine

## 2018-03-12 ENCOUNTER — Encounter: Payer: Self-pay | Admitting: Internal Medicine

## 2018-03-12 VITALS — BP 106/68 | HR 50 | Ht 60.5 in | Wt 139.0 lb

## 2018-03-12 DIAGNOSIS — E785 Hyperlipidemia, unspecified: Secondary | ICD-10-CM

## 2018-03-12 DIAGNOSIS — I2510 Atherosclerotic heart disease of native coronary artery without angina pectoris: Secondary | ICD-10-CM

## 2018-03-12 DIAGNOSIS — R002 Palpitations: Secondary | ICD-10-CM | POA: Diagnosis not present

## 2018-03-12 NOTE — Patient Instructions (Signed)

## 2018-03-12 NOTE — Progress Notes (Signed)
OFFICE NOTE  Chief Complaint:  Routine follow-up  Primary Care Physician: No primary care provider on file.  HPI:  Victoria Sims is a 77 y.o. female who is kindly referred to me for evaluation and management of recent statin intolerance. She's had problems with her digestive tract for some time and was placed on a statin recently due to some abnormal findings on a CT scan. This was a screening CT scan of her lungs as she is a smoker. It did not demonstrate any lung masses but did demonstrate coronary artery calcification. She was placed on statin therapy but to my knowledge has not had a cholesterol profile recently. She noted that the statins cause worsening constipation and stopped the medication. In addition she was sent to Dr. Irven Shelling office for an echocardiogram. This demonstrated a low normal EF of 50-55% with reportedly normal diastolic function, and trace tricuspid regurgitation with mild pulmonic regurgitation. 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 Her energy level has decreased as well. She denies any frank chest pain but does get short of breath doing certain activities. She also reports pain in her calf when walking that gets better at rest. This sometimes limits her walking long distances.  02/08/2016  Victoria Sims returns today for follow-up. She underwent a recent lipid profile in August 2017 which showed total cholesterol 285, LDL 208 and a very high LDL particle number of 20-21. April be level is 150 and LPa was elevated at 185. This indicates a marked mixed dyslipidemia. Unfortunately she had been intolerant to statins. She is currently on pravastatin 80 however I do not think this is likely to get her to her goal LDL. She also underwent stress testing which was negative for ischemia and lower extremity arterial Dopplers which showed normal ABIs and no evidence of abdominal aortic  aneurysm given her history of smoking.  08/15/2016  Victoria Sims returns for follow-up. She reports she been having some waves of symptoms a started in her stomach and go up into her chest. She does not think his reflux and does not describe it as pain or pressure. She says is very short-lived and can only go for a few seconds. She says it catches her reading and takes it away. She sometimes feels like her heart may race and I wonder if these are actually palpitations or perhaps PVCs. Nothing is noted on EKG today. She's had marked improvement in cholesterol on Lipitor 80 with LDL of 74, however she reports recent recurrent UTIs. She said after starting the medication she had foul-smelling urine has been treated with a number of antibiotics. No clear sources been found. Interestingly, this is listed as side effect of Lipitor although it was fairly uncommon (<1%).  09/19/2016  Victoria Sims was seen today in follow-up. She reports she still having waves started her stomach and come up to her throat. I don't believe the palpitations and she has these all throughout the day. While wearing a monitor she was noted to have some PACs but they were not that frequent. There is no evidence of A. fib or any other concerning arrhythmias. I do not think the symptoms are related to arrhythmia. It could be related to GI dysmotility, esophageal spasm or perhaps reflux, or or more likely anxiety which she seems to struggle with. I apprised her to consider trying the Xanax when she feels these episodes come on. With  regards to her UTIs, they have not returned off of Lipitor. Interestingly this is a listed side effect although very unlikely. Previously she was on pravastatin however with multivessel coronary artery calcification and other risk factors for goal LDL-C was less than 70 and her starting LDL was 208 therefore she was thought not to be able to reach goal based on that medication. Since she is now intolerant of  atorvastatin, I would recommend we try rosuvastatin. We'll initially start at the lowest "high potency" dose - 20 mg daily.   09/26/2017  Victoria Sims returns today for routine follow-up.  Overall she seems to be doing well.  She denies any recent palpitations.  As she is noted to have multivessel coronary calcification, her goal LDL is less than 70.  Most recently her LDL has remained above that in the mid to high 80s.  This is on maximally tolerated dose of rosuvastatin 20 mg daily.  She denies chest pain or worsening shortness of breath.  03/12/2018  Victoria Sims is seen today in routine follow-up.  She is doing fairly well.  She has been tolerating ezetimibe.  Recent labs in August demonstrated total cholesterol 128, HDL 60, LDL 58 and triglycerides 50.  She denies any chest pain or worsening shortness of breath.  She does report some claudication however it is minor and does not impair her walking.  I encouraged more walking.  She is followed by pulmonary for COPD as well as a lung nodule which is followed by CT.   PMHx:  Past Medical History:  Diagnosis Date  . Cancer ([place]) 3/07   right breast/ER/PR positive  . Cataract    and dry eye/spots on retina  . Cataract    surgery on right eye 04/12/14  . Condyloma 01/1998  . Depression   . Grave's disease   . Macular degeneration of both eyes   . Microhematuria 07/1992   negative IVP, [place]  . MRSA (methicillin resistant Staphylococcus aureus)   . Shingles     Past Surgical History:  Procedure Laterality Date  . ABDOMINAL HYSTERECTOMY    . BREAST BIOPSY  1990   left  . CATARACT EXTRACTION  04/12/14   right eye  . CATARACT EXTRACTION  2/16   left eye  . CHOLECYSTECTOMY    . EYE SURGERY    . goiter  7/93   radioactive iodine  . INGUINAL HERNIA REPAIR  09/29/2011   Procedure: HERNIA REPAIR INGUINAL ADULT;  Surgeon: Rolm Bookbinder, MD;  Location: [facility];  Service: General;  Laterality: Right;  . TUBAL LIGATION     D&C    FAMHx:    Family History  Problem Relation Age of Onset  . Hypertension Mother   . Heart disease Mother   . Breast cancer Neg Hx     SOCHx:   reports that she has been smoking cigarettes. She has a 27.00 pack-year smoking history. She has never used smokeless tobacco. She reports that she does not drink alcohol or use drugs.  ALLERGIES:  Allergies  Allergen Reactions  . Atorvastatin Other (See Comments)    UTIs  . Gabapentin Nausea And Vomiting  . Macrobid [Nitrofurantoin Macrocrystal] Other (See Comments)    hallucination  . Sulfa Antibiotics Rash    ROS: Pertinent items noted in HPI and remainder of comprehensive ROS otherwise negative.  HOME MEDS: Current Outpatient Medications  Medication Sig Dispense Refill  . ALPRAZolam (XANAX) 1 MG tablet Take 1 tablet (1 mg total) by mouth at  bedtime as needed for sleep. 30 tablet 1  . aspirin 81 MG EC tablet Take 81 mg by mouth daily.  99  . bisacodyl (DULCOLAX) 5 MG EC tablet Take 5 mg by mouth daily as needed for moderate constipation.    . Calcium-Vitamin D (CALTRATE 600 PLUS-VIT D PO) Take 1 tablet by mouth daily.    . Cholecalciferol (D3-1000) 1000 UNITS capsule Take 1,000 Units by mouth daily.    Marland Kitchen escitalopram (LEXAPRO) 20 MG tablet Take 40 mg by mouth daily.    Marland Kitchen ezetimibe (ZETIA) 10 MG tablet Take 10 mg by mouth daily.    . furosemide (LASIX) 40 MG tablet Take 40 mg by mouth daily.    Marland Kitchen levothyroxine (SYNTHROID, LEVOTHROID) 88 MCG tablet Take 88 mcg by mouth daily.  3  . mupirocin ointment (BACTROBAN) 2 % APPLY 1 APPLICATION INTO NOSE TWICE DAILY 22 g 0  . pantoprazole (PROTONIX) 40 MG tablet Take by mouth.    Marland Kitchen POTASSIUM PO Take 1 Tbsp by mouth daily    . rosuvastatin (CRESTOR) 20 MG tablet TAKE 1 TABLET BY MOUTH daily 90 tablet 0   No current facility-administered medications for this visit.     LABS/IMAGING: No results found for this or any previous visit (from the past 48 hour(s)). No results found.  WEIGHTS: Wt Readings  from Last 3 Encounters:  03/12/18 139 lb (63 kg)  02/13/18 138 lb (62.6 kg)  02/06/18 137 lb 3.2 oz (62.2 kg)    VITALS: BP 106/68 (BP Location: Left Arm, Patient Position: Sitting, Cuff Size: Normal)   Pulse (!) 50   Ht 5' 0.5" (1.537 m)   Wt 139 lb (63 kg)   BMI 26.70 kg/m   EXAM: General appearance: alert and no distress Neck: no carotid bruit, no JVD and thyroid not enlarged, symmetric, no tenderness/mass/nodules Lungs: clear to auscultation bilaterally Heart: regular rate and rhythm Abdomen: soft, non-tender; bowel sounds normal; no masses,  no organomegaly Extremities: extremities normal, atraumatic, no cyanosis or edema Pulses: 2+ and symmetric Skin: Skin color, texture, turgor normal. No rashes or lesions Neurologic: Grossly normal Psych: Pleasant  EKG: Sinus bradycardia at 50-personally reviewed  ASSESSMENT: 1. Palpitations 2. Fatigue and dyspnea - improved 3. Multivessel coronary artery calcium by CT 4. Dyslipidemia-not at goal less than 70 5. Low normal LVEF of 50% by echo in June 2017 6. Claudication  PLAN: 1.   Mrs. Osmer continues to do well with excellent control of her cholesterol now on ezetimibe in addition to her statin.  Her fatigue and dyspnea have improved.  She denies any significant claudication.  She is a goal LDL less than 70.  Her palpitations of also improved.  Continue current medications.  Follow-up annually or sooner as necessary.  Pixie Casino, MD, [facility], [place] Director of the [facility]  [facility] Diplomate of the American Board of Clinical Lipidology Attending Cardiologist  Direct Dial: [phone]  Fax: [phone]  Website:  [email or web address] Norissa Bartee 03/12/2018, 8:12 AM

## 2018-03-19 ENCOUNTER — Ambulatory Visit (INDEPENDENT_AMBULATORY_CARE_PROVIDER_SITE_OTHER): Payer: Medicare Other | Admitting: Licensed Clinical Social Worker

## 2018-03-19 ENCOUNTER — Other Ambulatory Visit: Payer: Self-pay | Admitting: Internal Medicine

## 2018-03-19 DIAGNOSIS — F3341 Major depressive disorder, recurrent, in partial remission: Secondary | ICD-10-CM

## 2018-03-19 DIAGNOSIS — E785 Hyperlipidemia, unspecified: Secondary | ICD-10-CM

## 2018-04-02 ENCOUNTER — Ambulatory Visit: Payer: Medicare Other | Admitting: Licensed Clinical Social Worker

## 2018-04-16 ENCOUNTER — Ambulatory Visit (INDEPENDENT_AMBULATORY_CARE_PROVIDER_SITE_OTHER): Payer: Medicare Other | Admitting: Licensed Clinical Social Worker

## 2018-04-16 DIAGNOSIS — F3341 Major depressive disorder, recurrent, in partial remission: Secondary | ICD-10-CM | POA: Diagnosis not present

## 2018-04-17 DIAGNOSIS — N390 Urinary tract infection, site not specified: Secondary | ICD-10-CM | POA: Diagnosis not present

## 2018-04-29 DIAGNOSIS — R319 Hematuria, unspecified: Secondary | ICD-10-CM | POA: Diagnosis not present

## 2018-04-29 DIAGNOSIS — N390 Urinary tract infection, site not specified: Secondary | ICD-10-CM | POA: Diagnosis not present

## 2018-05-12 ENCOUNTER — Telehealth: Payer: Self-pay | Admitting: Obstetrics & Gynecology

## 2018-05-12 ENCOUNTER — Ambulatory Visit (INDEPENDENT_AMBULATORY_CARE_PROVIDER_SITE_OTHER): Payer: Medicare Other | Admitting: Obstetrics & Gynecology

## 2018-05-12 VITALS — BP 116/62 | HR 62 | Resp 16 | Ht 60.5 in | Wt 137.6 lb

## 2018-05-12 DIAGNOSIS — N898 Other specified noninflammatory disorders of vagina: Secondary | ICD-10-CM

## 2018-05-12 DIAGNOSIS — R300 Dysuria: Secondary | ICD-10-CM

## 2018-05-12 LAB — POCT URINALYSIS DIPSTICK
Bilirubin, UA: NEGATIVE
Glucose, UA: NEGATIVE
Ketones, UA: NEGATIVE
NITRITE UA: NEGATIVE
Protein, UA: NEGATIVE
UROBILINOGEN UA: NEGATIVE U/dL — AB
pH, UA: 5 (ref 5.0–8.0)

## 2018-05-12 MED ORDER — AMOXICILLIN-POT CLAVULANATE 500-125 MG PO TABS: 1.0000 | ORAL_TABLET | Freq: Two times a day (BID) | ORAL | 0 refills | Status: DC

## 2018-05-12 MED ORDER — FLUCONAZOLE 150 MG PO TABS: 150.0000 mg | ORAL_TABLET | Freq: Once | ORAL | 0 refills | Status: AC

## 2018-05-12 NOTE — Progress Notes (Signed)
GYNECOLOGY  VISIT  CC:   Possible UTI  HPI: 78 y.o. G0P0000 Married White or Caucasian female here for UTI symptoms.  She saw Lilyan Gilford at [facility] with complaint of dysuria, urgency, frequency.  She was treated with amoxicillin but didn't improve and she called back.  New antibiotic was called into pharmacy.  She didn't improve so went to ER on 04/29/18 in [place] at [facility] (was [place]).  Was treated with two injections and then antibiotics for 10 days.  She was also given pyridium.  This has helped but symptoms are not fully resolved.  Denies fever or pelvic pain.    She ialso reports she is having vaginal discharge but no vaginal bleeding.  She is on day 6 of an OTC yeast treatment.  Denies fever or chills.  Would like this assessed.  GYNECOLOGIC HISTORY: No LMP recorded. Patient has had a hysterectomy. Contraception: post hysterectomy Menopausal hormone therapy: none  Patient Active Problem List   Diagnosis Date Noted  . Bilateral primary osteoarthritis of knee 03/20/2017  . Chest discomfort 09/19/2016  . Heart palpitations 08/15/2016  . ASCVD (arteriosclerotic cardiovascular disease) 02/08/2016  . Claudication ([place]) 01/04/2016  . Dyslipidemia 01/04/2016  . Calcification of coronary artery 01/04/2016  . Chest pain, unspecified 01/04/2016  . Cystocele 06/06/2014  . Acid reflux 03/31/2014  . H/O malignant neoplasm of breast 03/31/2014  . H/O infectious disease 03/31/2014  . HLD (hyperlipidemia) 03/31/2014  . OP (osteoporosis) 03/31/2014  . Smoker 03/31/2014  . MRSA (methicillin resistant staph aureus) culture positive 12/04/2013  . History of DVT (deep vein thrombosis) 05/18/2013  . Cataract 01/20/2013  . 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 Urge incontinence 07/11/2011    Past Medical History:  Diagnosis Date  . Cancer ([place]) 3/07   right breast/ER/PR positive  . Cataract    and dry eye/spots on retina  . Cataract    surgery on right eye 04/12/14  .  Condyloma 01/1998  . Depression   . Grave's disease   . Macular degeneration of both eyes   . Microhematuria 07/1992   negative IVP, [place]  . MRSA (methicillin resistant Staphylococcus aureus)   . Shingles     Past Surgical History:  Procedure Laterality Date  . ABDOMINAL HYSTERECTOMY    . BREAST BIOPSY  1990   left  . CATARACT EXTRACTION  04/12/14   right eye  . CATARACT EXTRACTION  2/16   left eye  . CHOLECYSTECTOMY    . EYE SURGERY    . goiter  7/93   radioactive iodine  . INGUINAL HERNIA REPAIR  09/29/2011   Procedure: HERNIA REPAIR INGUINAL ADULT;  Surgeon: Rolm Bookbinder, MD;  Location: [facility];  Service: General;  Laterality: Right;  . TUBAL LIGATION     D&C    MEDS:   Current Outpatient Medications on File Prior to Visit  Medication Sig Dispense Refill  . ALPRAZolam (XANAX) 1 MG tablet Take 1 tablet (1 mg total) by mouth at bedtime as needed for sleep. 30 tablet 1  . aspirin 81 MG EC tablet Take 81 mg by mouth daily.  99  . bisacodyl (DULCOLAX) 5 MG EC tablet Take 5 mg by mouth daily as needed for moderate constipation.    . Calcium-Vitamin D (CALTRATE 600 PLUS-VIT D PO) Take 1 tablet by mouth daily.    . Cholecalciferol (D3-1000) 1000 UNITS capsule Take 1,000 Units by mouth daily.    Marland Kitchen escitalopram (LEXAPRO) 20 MG tablet Take 40 mg by mouth daily.    Marland Kitchen  ezetimibe (ZETIA) 10 MG tablet Take 10 mg by mouth daily.    . furosemide (LASIX) 40 MG tablet Take 40 mg by mouth daily.    Marland Kitchen levothyroxine (SYNTHROID, LEVOTHROID) 88 MCG tablet Take 88 mcg by mouth daily.  3  . mupirocin ointment (BACTROBAN) 2 % APPLY 1 APPLICATION INTO NOSE TWICE DAILY 22 g 0  . pantoprazole (PROTONIX) 40 MG tablet Take by mouth.    Marland Kitchen POTASSIUM PO Take 1 Tbsp by mouth daily    . rosuvastatin (CRESTOR) 20 MG tablet TAKE 1 TABLET BY MOUTH daily 90 tablet 0   No current facility-administered medications on file prior to visit.     ALLERGIES: Atorvastatin; Gabapentin; Macrobid [nitrofurantoin  macrocrystal]; and Sulfa antibiotics  Family History  Problem Relation Age of Onset  . Hypertension Mother   . Heart disease Mother   . Breast cancer Neg Hx     SH:  Married, smoker  Review of Systems  Gastrointestinal: Positive for abdominal distention, nausea and rectal pain.  Genitourinary: Positive for dysuria, frequency and urgency.  All other systems reviewed and are negative.   PHYSICAL EXAMINATION:    BP 116/62   Pulse 62   Resp 16   Ht 5' 0.5" (1.537 m)   Wt 137 lb 9.6 oz (62.4 kg)   BMI 26.43 kg/m     General appearance: alert, cooperative and appears stated age CV:  Regular rate and rhythm Lungs:  clear to auscultation, no wheezes, rales or rhonchi, symmetric air entry Abdomen: soft, non-tender; bowel sounds normal; no masses,  no organomegaly Flank:  No CVA tenderness Lymph:  no inguinal LAD noted  Pelvic: External genitalia:  no lesions              Urethra:  normal appearing urethra with no masses, tenderness or lesions              Bartholins and Skenes: normal                 Vagina: normal appearing vagina with normal color and discharge, no lesions              Cervix: absent              Bimanual Exam:  Uterus:  uterus absent              Adnexa: no mass, fullness, tenderness  Chaperone was present for exam.  Assessment: Dysuria Vaginal discharge  Plan: Affirm pending.  If positive, will treat Urine culture pending.  For now will switch to augmentin 500/125 bid x 5 days Rx also given for Diflucan 150mg  po x 1, repeat 72 hours   ~20 minutes spent with patient >50% of time was in face to face discussion of above.

## 2018-05-12 NOTE — Telephone Encounter (Signed)
Spoke with patient. Patient reports burning with urination, lower back pain and white vaginal d/c. Denies fever/chills, N/V, blood in urine or vaginal bleeding.   Patient states she was tx for UTI by PCP 3wks ago, symptoms did not resolve. Seen at [facility] ER 2 wks ago for UTI symptoms, tx with "2 injections and oral medication". Has been uring OTC monistat for 3 days, no changes in symptoms. Patient states symptoms never completely resolved, requesting OV.   OV scheduled for today at 4:15pm with Dr. Sabra Heck. Patient declined earlier time, patient does not drive will have to coordinate transportation.   Routing to provider for final review. Patient is agreeable to disposition. Will close encounter.

## 2018-05-12 NOTE — Telephone Encounter (Signed)
Patient says she's had a uti for 3 weeks. She has gone to the ED and was prescribed medication and is still not better.

## 2018-05-13 LAB — URINE CULTURE

## 2018-05-14 ENCOUNTER — Ambulatory Visit: Payer: Medicare Other | Admitting: Licensed Clinical Social Worker

## 2018-05-14 LAB — VAGINITIS/VAGINOSIS, DNA PROBE
Candida Species: NEGATIVE
GARDNERELLA VAGINALIS: NEGATIVE
TRICHOMONAS VAG: NEGATIVE

## 2018-05-15 ENCOUNTER — Encounter: Payer: Self-pay | Admitting: Obstetrics & Gynecology

## 2018-05-19 DIAGNOSIS — Z23 Encounter for immunization: Secondary | ICD-10-CM | POA: Diagnosis not present

## 2018-05-20 ENCOUNTER — Telehealth: Payer: Self-pay | Admitting: Obstetrics & Gynecology

## 2018-05-20 DIAGNOSIS — R300 Dysuria: Secondary | ICD-10-CM

## 2018-05-20 DIAGNOSIS — N3090 Cystitis, unspecified without hematuria: Secondary | ICD-10-CM

## 2018-05-20 DIAGNOSIS — R3129 Other microscopic hematuria: Secondary | ICD-10-CM

## 2018-05-20 NOTE — Telephone Encounter (Signed)
Spoke with patient. Patient states that she was supposed to be referred to Urology and has not received a phone call about scheduling an appointment. States since completing antibiotics she is feeling much better, but continues to have some symptoms. Urine culture on 05/12/2018 was negative. Per review of chart do not see referral to urology. Referral to [facility] placed. Patient is aware she will be contacted regarding scheduling.  Cc: Victoria Sims  Routing to provider and will close encounter.

## 2018-05-20 NOTE — Telephone Encounter (Signed)
Patient is asking to talk with Dr.Miller's nurse about her "problem". She did say this is not urgent.

## 2018-05-23 ENCOUNTER — Telehealth: Payer: Self-pay | Admitting: Obstetrics & Gynecology

## 2018-05-23 NOTE — Telephone Encounter (Signed)
Patient calling to check status of a referral to urologist.

## 2018-05-28 ENCOUNTER — Encounter: Payer: Self-pay | Admitting: Internal Medicine

## 2018-05-28 ENCOUNTER — Ambulatory Visit (INDEPENDENT_AMBULATORY_CARE_PROVIDER_SITE_OTHER): Payer: Medicare Other | Admitting: Licensed Clinical Social Worker

## 2018-05-28 DIAGNOSIS — E785 Hyperlipidemia, unspecified: Secondary | ICD-10-CM | POA: Diagnosis not present

## 2018-05-28 DIAGNOSIS — F3341 Major depressive disorder, recurrent, in partial remission: Secondary | ICD-10-CM | POA: Diagnosis not present

## 2018-05-28 DIAGNOSIS — N390 Urinary tract infection, site not specified: Secondary | ICD-10-CM | POA: Diagnosis not present

## 2018-05-28 DIAGNOSIS — E039 Hypothyroidism, unspecified: Secondary | ICD-10-CM | POA: Diagnosis not present

## 2018-06-04 DIAGNOSIS — R918 Other nonspecific abnormal finding of lung field: Secondary | ICD-10-CM | POA: Diagnosis not present

## 2018-06-04 DIAGNOSIS — N390 Urinary tract infection, site not specified: Secondary | ICD-10-CM | POA: Diagnosis not present

## 2018-06-04 DIAGNOSIS — Z0000 Encounter for general adult medical examination without abnormal findings: Secondary | ICD-10-CM | POA: Diagnosis not present

## 2018-06-04 DIAGNOSIS — F17200 Nicotine dependence, unspecified, uncomplicated: Secondary | ICD-10-CM | POA: Diagnosis not present

## 2018-06-04 DIAGNOSIS — E039 Hypothyroidism, unspecified: Secondary | ICD-10-CM | POA: Diagnosis not present

## 2018-06-04 DIAGNOSIS — E785 Hyperlipidemia, unspecified: Secondary | ICD-10-CM | POA: Diagnosis not present

## 2018-06-11 ENCOUNTER — Ambulatory Visit (INDEPENDENT_AMBULATORY_CARE_PROVIDER_SITE_OTHER): Payer: Medicare Other | Admitting: Licensed Clinical Social Worker

## 2018-06-11 DIAGNOSIS — F3341 Major depressive disorder, recurrent, in partial remission: Secondary | ICD-10-CM | POA: Diagnosis not present

## 2018-06-11 DIAGNOSIS — N390 Urinary tract infection, site not specified: Secondary | ICD-10-CM | POA: Diagnosis not present

## 2018-06-23 ENCOUNTER — Other Ambulatory Visit: Payer: Self-pay | Admitting: Internal Medicine

## 2018-06-23 DIAGNOSIS — E785 Hyperlipidemia, unspecified: Secondary | ICD-10-CM

## 2018-06-25 ENCOUNTER — Ambulatory Visit (INDEPENDENT_AMBULATORY_CARE_PROVIDER_SITE_OTHER): Payer: Medicare Other | Admitting: Licensed Clinical Social Worker

## 2018-06-25 DIAGNOSIS — F3341 Major depressive disorder, recurrent, in partial remission: Secondary | ICD-10-CM | POA: Diagnosis not present

## 2018-06-26 DIAGNOSIS — N3944 Nocturnal enuresis: Secondary | ICD-10-CM | POA: Diagnosis not present

## 2018-06-26 DIAGNOSIS — R350 Frequency of micturition: Secondary | ICD-10-CM | POA: Diagnosis not present

## 2018-06-26 DIAGNOSIS — N3941 Urge incontinence: Secondary | ICD-10-CM | POA: Diagnosis not present

## 2018-06-26 DIAGNOSIS — R310 Gross hematuria: Secondary | ICD-10-CM | POA: Diagnosis not present

## 2018-07-08 ENCOUNTER — Ambulatory Visit: Payer: Medicare Other | Admitting: Licensed Clinical Social Worker

## 2018-07-09 ENCOUNTER — Ambulatory Visit: Payer: Medicare Other | Admitting: Licensed Clinical Social Worker

## 2018-07-09 DIAGNOSIS — N289 Disorder of kidney and ureter, unspecified: Secondary | ICD-10-CM | POA: Diagnosis not present

## 2018-07-09 DIAGNOSIS — R310 Gross hematuria: Secondary | ICD-10-CM | POA: Diagnosis not present

## 2018-07-10 ENCOUNTER — Ambulatory Visit: Payer: Self-pay

## 2018-07-10 DIAGNOSIS — R351 Nocturia: Secondary | ICD-10-CM | POA: Diagnosis not present

## 2018-07-10 DIAGNOSIS — N3946 Mixed incontinence: Secondary | ICD-10-CM | POA: Diagnosis not present

## 2018-07-10 DIAGNOSIS — N3944 Nocturnal enuresis: Secondary | ICD-10-CM | POA: Diagnosis not present

## 2018-07-10 DIAGNOSIS — N3000 Acute cystitis without hematuria: Secondary | ICD-10-CM | POA: Diagnosis not present

## 2018-07-11 ENCOUNTER — Other Ambulatory Visit: Payer: Self-pay | Admitting: Family Medicine

## 2018-07-11 DIAGNOSIS — D49512 Neoplasm of unspecified behavior of left kidney: Secondary | ICD-10-CM

## 2018-07-16 ENCOUNTER — Ambulatory Visit
Admission: RE | Admit: 2018-07-16 | Discharge: 2018-07-16 | Disposition: A | Discharge status: Home / Self Care | Payer: Medicare Other | Source: Ambulatory Visit | Attending: Acute Care | Admitting: Acute Care

## 2018-07-16 DIAGNOSIS — F17210 Nicotine dependence, cigarettes, uncomplicated: Secondary | ICD-10-CM

## 2018-07-16 DIAGNOSIS — Z122 Encounter for screening for malignant neoplasm of respiratory organs: Secondary | ICD-10-CM

## 2018-07-16 DIAGNOSIS — Z87891 Personal history of nicotine dependence: Secondary | ICD-10-CM

## 2018-07-17 ENCOUNTER — Other Ambulatory Visit: Payer: Self-pay | Admitting: Acute Care

## 2018-07-17 DIAGNOSIS — Z87891 Personal history of nicotine dependence: Secondary | ICD-10-CM

## 2018-07-17 DIAGNOSIS — F17210 Nicotine dependence, cigarettes, uncomplicated: Principal | ICD-10-CM

## 2018-07-17 DIAGNOSIS — Z122 Encounter for screening for malignant neoplasm of respiratory organs: Secondary | ICD-10-CM

## 2018-07-23 ENCOUNTER — Ambulatory Visit: Payer: Medicare Other | Admitting: Licensed Clinical Social Worker

## 2018-07-23 ENCOUNTER — Other Ambulatory Visit: Payer: Self-pay

## 2018-07-23 ENCOUNTER — Ambulatory Visit
Admission: RE | Admit: 2018-07-23 | Discharge: 2018-07-23 | Disposition: A | Discharge status: Home / Self Care | Payer: Medicare Other | Source: Ambulatory Visit | Attending: Family Medicine | Admitting: Family Medicine

## 2018-07-23 DIAGNOSIS — N289 Disorder of kidney and ureter, unspecified: Secondary | ICD-10-CM | POA: Diagnosis not present

## 2018-07-23 DIAGNOSIS — D49512 Neoplasm of unspecified behavior of left kidney: Secondary | ICD-10-CM

## 2018-07-23 MED ORDER — GADOBENATE DIMEGLUMINE 529 MG/ML IV SOLN
12.0000 mL | Freq: Once | INTRAVENOUS | Status: AC | PRN
  Administered 2018-07-23: 12 mL via INTRAVENOUS

## 2018-07-31 ENCOUNTER — Ambulatory Visit (INDEPENDENT_AMBULATORY_CARE_PROVIDER_SITE_OTHER): Payer: Medicare Other | Admitting: Licensed Clinical Social Worker

## 2018-07-31 DIAGNOSIS — E785 Hyperlipidemia, unspecified: Secondary | ICD-10-CM | POA: Diagnosis not present

## 2018-07-31 DIAGNOSIS — N390 Urinary tract infection, site not specified: Secondary | ICD-10-CM | POA: Diagnosis not present

## 2018-07-31 DIAGNOSIS — F17200 Nicotine dependence, unspecified, uncomplicated: Secondary | ICD-10-CM | POA: Diagnosis not present

## 2018-07-31 DIAGNOSIS — F3341 Major depressive disorder, recurrent, in partial remission: Secondary | ICD-10-CM | POA: Diagnosis not present

## 2018-07-31 DIAGNOSIS — E039 Hypothyroidism, unspecified: Secondary | ICD-10-CM | POA: Diagnosis not present

## 2018-08-06 ENCOUNTER — Ambulatory Visit: Payer: Medicare Other | Admitting: Licensed Clinical Social Worker

## 2018-08-11 DIAGNOSIS — D239 Other benign neoplasm of skin, unspecified: Secondary | ICD-10-CM | POA: Diagnosis not present

## 2018-08-11 DIAGNOSIS — L570 Actinic keratosis: Secondary | ICD-10-CM | POA: Diagnosis not present

## 2018-08-11 DIAGNOSIS — D1801 Hemangioma of skin and subcutaneous tissue: Secondary | ICD-10-CM | POA: Diagnosis not present

## 2018-08-11 DIAGNOSIS — D225 Melanocytic nevi of trunk: Secondary | ICD-10-CM | POA: Diagnosis not present

## 2018-08-11 DIAGNOSIS — D2261 Melanocytic nevi of right upper limb, including shoulder: Secondary | ICD-10-CM | POA: Diagnosis not present

## 2018-08-11 DIAGNOSIS — D485 Neoplasm of uncertain behavior of skin: Secondary | ICD-10-CM | POA: Diagnosis not present

## 2018-08-20 ENCOUNTER — Ambulatory Visit: Payer: Medicare Other | Admitting: Licensed Clinical Social Worker

## 2018-08-21 DIAGNOSIS — D49512 Neoplasm of unspecified behavior of left kidney: Secondary | ICD-10-CM | POA: Diagnosis not present

## 2018-08-21 DIAGNOSIS — N3946 Mixed incontinence: Secondary | ICD-10-CM | POA: Diagnosis not present

## 2018-08-29 DIAGNOSIS — N390 Urinary tract infection, site not specified: Secondary | ICD-10-CM | POA: Diagnosis not present

## 2018-08-29 DIAGNOSIS — N3946 Mixed incontinence: Secondary | ICD-10-CM | POA: Diagnosis not present

## 2018-08-31 ENCOUNTER — Other Ambulatory Visit: Payer: Self-pay | Admitting: Internal Medicine

## 2018-09-01 ENCOUNTER — Other Ambulatory Visit: Payer: Self-pay | Admitting: Obstetrics & Gynecology

## 2018-09-01 DIAGNOSIS — Z1231 Encounter for screening mammogram for malignant neoplasm of breast: Secondary | ICD-10-CM

## 2018-09-03 ENCOUNTER — Ambulatory Visit: Payer: Medicare Other | Admitting: Licensed Clinical Social Worker

## 2018-09-11 ENCOUNTER — Other Ambulatory Visit: Payer: Self-pay

## 2018-09-11 MED ORDER — EZETIMIBE 10 MG PO TABS: 10.0000 mg | ORAL_TABLET | Freq: Every day | ORAL | 3 refills | Status: DC

## 2018-09-17 ENCOUNTER — Ambulatory Visit (INDEPENDENT_AMBULATORY_CARE_PROVIDER_SITE_OTHER): Payer: Medicare Other | Admitting: Orthopaedic Surgery

## 2018-09-17 ENCOUNTER — Other Ambulatory Visit: Payer: Self-pay

## 2018-09-17 ENCOUNTER — Encounter: Payer: Self-pay | Admitting: Orthopaedic Surgery

## 2018-09-17 ENCOUNTER — Ambulatory Visit: Payer: Medicare Other | Admitting: Licensed Clinical Social Worker

## 2018-09-17 VITALS — Ht 61.0 in | Wt 134.0 lb

## 2018-09-17 DIAGNOSIS — M170 Bilateral primary osteoarthritis of knee: Secondary | ICD-10-CM

## 2018-09-17 MED ORDER — METHYLPREDNISOLONE ACETATE 40 MG/ML IJ SUSP
80.0000 mg | INTRAMUSCULAR | Status: AC | PRN
  Administered 2018-09-17: 80 mg via INTRA_ARTICULAR

## 2018-09-17 MED ORDER — BUPIVACAINE HCL 0.5 % IJ SOLN
2.0000 mL | INTRAMUSCULAR | Status: AC | PRN
  Administered 2018-09-17: 14:00:00 2 mL via INTRA_ARTICULAR

## 2018-09-17 MED ORDER — LIDOCAINE HCL 1 % IJ SOLN
2.0000 mL | INTRAMUSCULAR | Status: AC | PRN
  Administered 2018-09-17: 14:00:00 2 mL

## 2018-09-17 MED ORDER — BUPIVACAINE HCL 0.5 % IJ SOLN
2.0000 mL | INTRAMUSCULAR | Status: AC | PRN
  Administered 2018-09-17: 2 mL via INTRA_ARTICULAR

## 2018-09-17 MED ORDER — LIDOCAINE HCL 1 % IJ SOLN
2.0000 mL | INTRAMUSCULAR | Status: AC | PRN
  Administered 2018-09-17: 2 mL

## 2018-09-17 NOTE — Progress Notes (Signed)
Office Visit Note   Patient: HELYNE Sims           Date of Birth: [birth year]           MRN: [id number] Visit Date: 09/17/2018              Requested by: No referring provider defined for this encounter. PCP: Patient, No Pcp Per   Assessment & Plan: Visit Diagnoses:  1. Bilateral primary osteoarthritis of knee     Plan: Bilateral knee osteoarthritis with good result with cortisone previously.  Will inject both knees with cortisone. prior films in 2018  Follow-Up Instructions: Return in about 2 weeks (around 10/01/2018).   Orders:  Orders Placed This Encounter  Procedures  . Large Joint Inj: bilateral knee   No orders of the defined types were placed in this encounter.     Procedures: Large Joint Inj: bilateral knee on 09/17/2018 2:02 PM Indications: diagnostic evaluation Details: 25 G 1.5 in needle, anteromedial approach  Arthrogram: No  Medications (Right): 2 mL lidocaine 1 %; 2 mL bupivacaine 0.5 %; 80 mg methylPREDNISolone acetate 40 MG/ML Medications (Left): 2 mL lidocaine 1 %; 2 mL bupivacaine 0.5 %; 80 mg methylPREDNISolone acetate 40 MG/ML Consent was given by the patient. Immediately prior to procedure a time out was called to verify the correct patient, procedure, equipment, support staff and site/side marked as required. Patient was prepped and draped in the usual sterile fashion.       Clinical Data: No additional findings.   Subjective: Chief Complaint  Patient presents with  . Left Knee - Pain  . Right Knee - Pain  Patient presents today for bilateral knee pain. She was last here on 09/18/17 and had both knee injected with cortisone. She said that the cortisone helped, but has been hurting again for about 26months. She is wanting to get both knees injected today. She is taking Motrin as needed.  HPI  Review of Systems   Objective: Vital Signs: Ht 5\' 1"  (1.549 m)   Wt 134 lb (60.8 kg)   BMI 25.32 kg/m   Physical Exam Constitutional:       Appearance: She is well-developed.  Eyes:     Pupils: Pupils are equal, round, and reactive to light.  Pulmonary:     Effort: Pulmonary effort is normal.  Skin:    General: Skin is warm and dry.  Neurological:     Mental Status: She is alert and oriented to person, place, and time.  Psychiatric:        Behavior: Behavior normal.     Ortho Exam awake alert and oriented x3.  Comfortable sitting.  Possibly small effusion to both knees.  Some patellar crepitation bilaterally.  Full extension flexion over 105 degrees without instability.  Mild medial joint pain bilaterally motor exam intact  Specialty Comments:  No specialty comments available.  Imaging: No results found.   PMFS History: Patient Active Problem List   Diagnosis Date Noted  . Bilateral primary osteoarthritis of knee 03/20/2017  . Chest discomfort 09/19/2016  . Heart palpitations 08/15/2016  . ASCVD (arteriosclerotic cardiovascular disease) 02/08/2016  . Claudication ([place]) 01/04/2016  . Dyslipidemia 01/04/2016  . Calcification of coronary artery 01/04/2016  . Chest pain, unspecified 01/04/2016  . Cystocele 06/06/2014  . Acid reflux 03/31/2014  . H/O malignant neoplasm of breast 03/31/2014  . H/O infectious disease 03/31/2014  . HLD (hyperlipidemia) 03/31/2014  . OP (osteoporosis) 03/31/2014  . Smoker 03/31/2014  . MRSA (methicillin resistant  staph aureus) culture positive 12/04/2013  . History of DVT (deep vein thrombosis) 05/18/2013  . Cataract 01/20/2013  . 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 Urge incontinence 07/11/2011   Past Medical History:  Diagnosis Date  . Cancer ([place]) 3/07   right breast/ER/PR positive  . Cataract    and dry eye/spots on retina  . Cataract    surgery on right eye 04/12/14  . Condyloma 01/1998  . Depression   . Grave's disease   . Macular degeneration of both eyes   . Microhematuria 07/1992   negative IVP, [place]  . MRSA (methicillin resistant Staphylococcus aureus)   . Shingles      Family History  Problem Relation Age of Onset  . Hypertension Mother   . Heart disease Mother   . Breast cancer Neg Hx     Past Surgical History:  Procedure Laterality Date  . ABDOMINAL HYSTERECTOMY    . BREAST BIOPSY  1990   left  . CATARACT EXTRACTION  04/12/14   right eye  . CATARACT EXTRACTION  2/16   left eye  . CHOLECYSTECTOMY    . EYE SURGERY    . goiter  7/93   radioactive iodine  . INGUINAL HERNIA REPAIR  09/29/2011   Procedure: HERNIA REPAIR INGUINAL ADULT;  Surgeon: Rolm Bookbinder, MD;  Location: [facility];  Service: General;  Laterality: Right;  . TUBAL LIGATION     D&C   Social History   Occupational History  . Not on file  Tobacco Use  . Smoking status: Current Every Day Smoker    Packs/day: 0.50    Years: 54.00    Pack years: 27.00    Types: Cigarettes  . Smokeless tobacco: Never Used  Substance and Sexual Activity  . Alcohol use: No    Alcohol/week: 0.0 standard drinks  . Drug use: No  . Sexual activity: Yes    Partners: Male    Birth control/protection: Surgical    Comment: hysterectomy

## 2018-09-30 ENCOUNTER — Other Ambulatory Visit: Payer: Self-pay | Admitting: Internal Medicine

## 2018-09-30 DIAGNOSIS — E785 Hyperlipidemia, unspecified: Secondary | ICD-10-CM

## 2018-10-01 ENCOUNTER — Ambulatory Visit (INDEPENDENT_AMBULATORY_CARE_PROVIDER_SITE_OTHER): Payer: Medicare Other | Admitting: Orthopaedic Surgery

## 2018-10-01 ENCOUNTER — Other Ambulatory Visit: Payer: Self-pay

## 2018-10-01 ENCOUNTER — Encounter: Payer: Self-pay | Admitting: Orthopaedic Surgery

## 2018-10-01 VITALS — Ht 61.0 in | Wt 134.0 lb

## 2018-10-01 DIAGNOSIS — G8929 Other chronic pain: Secondary | ICD-10-CM

## 2018-10-01 DIAGNOSIS — M25511 Pain in right shoulder: Secondary | ICD-10-CM | POA: Diagnosis not present

## 2018-10-01 MED ORDER — LIDOCAINE HCL 2 % IJ SOLN
2.0000 mL | INTRAMUSCULAR | Status: AC | PRN
  Administered 2018-10-01: 2 mL

## 2018-10-01 MED ORDER — BUPIVACAINE HCL 0.5 % IJ SOLN
2.0000 mL | INTRAMUSCULAR | Status: AC | PRN
  Administered 2018-10-01: 2 mL via INTRA_ARTICULAR

## 2018-10-01 MED ORDER — METHYLPREDNISOLONE ACETATE 40 MG/ML IJ SUSP
80.0000 mg | INTRAMUSCULAR | Status: AC | PRN
  Administered 2018-10-01: 80 mg via INTRA_ARTICULAR

## 2018-10-01 NOTE — Progress Notes (Signed)
Office Visit Note   Patient: Victoria Sims           Date of Birth: Jul 31, 1940           MRN: [id number] Visit Date: 10/01/2018              Requested by: No referring provider defined for this encounter. PCP: Holland Commons, FNP   Assessment & Plan: Visit Diagnoses:  1. Chronic right shoulder pain     Plan: Impingement syndrome right shoulder.  Will inject subacromial region with Depo-Medrol monitor response.  Has done well in the past with similar injections.  Doing well with her knees after injection several weeks ago  Follow-Up Instructions: Return if symptoms worsen or fail to improve.   Orders:  No orders of the defined types were placed in this encounter.  No orders of the defined types were placed in this encounter.     Procedures: Large Joint Inj: R subacromial bursa on 10/01/2018 9:08 AM Indications: pain and diagnostic evaluation Details: 25 G 1.5 in needle, anterolateral approach  Arthrogram: No  Medications: 2 mL lidocaine 2 %; 2 mL bupivacaine 0.5 %; 80 mg methylPREDNISolone acetate 40 MG/ML Consent was given by the patient. Immediately prior to procedure a time out was called to verify the correct patient, procedure, equipment, support staff and site/side marked as required. Patient was prepped and draped in the usual sterile fashion.       Clinical Data: No additional findings.   Subjective: Chief Complaint  Patient presents with  . Right Knee - Follow-up  . Left Knee - Follow-up  Patient presents today for a two week follow up. She received bilateral cortisone injections in her knees two weeks ago. Patient states that her knees are feeling much better. She is wanting to get a cortisone injection in her right shoulder today.   HPI  Review of Systems   Objective: Vital Signs: Ht 5\' 1"  (1.549 m)   Wt 134 lb (60.8 kg)   BMI 25.32 kg/m   Physical Exam Constitutional:      Appearance: She is well-developed.  Eyes:     Pupils:  Pupils are equal, round, and reactive to light.  Pulmonary:     Effort: Pulmonary effort is normal.  Skin:    General: Skin is warm and dry.  Neurological:     Mental Status: She is alert and oriented to person, place, and time.  Psychiatric:        Behavior: Behavior normal.     Ortho Exam right shoulder with full overhead active motion but with positive minimally positive empty can testing.  No grating or crepitation with shoulder motion.  Some pain in the anterior lateral subacromial region.  No pain at the acromioclavicular joint.  Biceps appears to be intact.  Skin intact.  Neurovascular exam  Specialty Comments:  No specialty comments available.  Imaging: No results found.   PMFS History: Patient Active Problem List   Diagnosis Date Noted  . Bilateral primary osteoarthritis of knee 03/20/2017  . Chest discomfort 09/19/2016  . Heart palpitations 08/15/2016  . ASCVD (arteriosclerotic cardiovascular disease) 02/08/2016  . Claudication ([place]) 01/04/2016  . Dyslipidemia 01/04/2016  . Calcification of coronary artery 01/04/2016  . Chest pain, unspecified 01/04/2016  . Cystocele 06/06/2014  . Acid reflux 03/31/2014  . H/O malignant neoplasm of breast 03/31/2014  . H/O infectious disease 03/31/2014  . HLD (hyperlipidemia) 03/31/2014  . OP (osteoporosis) 03/31/2014  . Smoker 03/31/2014  . MRSA (  methicillin resistant staph aureus) culture positive 12/04/2013  . History of DVT (deep vein thrombosis) 05/18/2013  . Cataract 01/20/2013  . 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 Urge incontinence 07/11/2011   Past Medical History:  Diagnosis Date  . Cancer ([place]) 3/07   right breast/ER/PR positive  . Cataract    and dry eye/spots on retina  . Cataract    surgery on right eye 04/12/14  . Condyloma 01/1998  . Depression   . Grave's disease   . Macular degeneration of both eyes   . Microhematuria 07/1992   negative IVP, [place]  . MRSA (methicillin resistant Staphylococcus aureus)   .  Shingles     Family History  Problem Relation Age of Onset  . Hypertension Mother   . Heart disease Mother   . Breast cancer Neg Hx     Past Surgical History:  Procedure Laterality Date  . ABDOMINAL HYSTERECTOMY    . BREAST BIOPSY  1990   left  . CATARACT EXTRACTION  04/12/14   right eye  . CATARACT EXTRACTION  2/16   left eye  . CHOLECYSTECTOMY    . EYE SURGERY    . goiter  7/93   radioactive iodine  . INGUINAL HERNIA REPAIR  09/29/2011   Procedure: HERNIA REPAIR INGUINAL ADULT;  Surgeon: Rolm Bookbinder, MD;  Location: [facility];  Service: General;  Laterality: Right;  . TUBAL LIGATION     D&C   Social History   Occupational History  . Not on file  Tobacco Use  . Smoking status: Current Every Day Smoker    Packs/day: 0.50    Years: 54.00    Pack years: 27.00    Types: Cigarettes  . Smokeless tobacco: Never Used  Substance and Sexual Activity  . Alcohol use: No    Alcohol/week: 0.0 standard drinks  . Drug use: No  . Sexual activity: Yes    Partners: Male    Birth control/protection: Surgical    Comment: hysterectomy

## 2018-10-15 ENCOUNTER — Other Ambulatory Visit: Payer: Self-pay

## 2018-10-15 ENCOUNTER — Ambulatory Visit (INDEPENDENT_AMBULATORY_CARE_PROVIDER_SITE_OTHER): Payer: Medicare Other | Admitting: Orthopaedic Surgery

## 2018-10-15 ENCOUNTER — Ambulatory Visit: Payer: Medicare Other | Admitting: Licensed Clinical Social Worker

## 2018-10-15 ENCOUNTER — Encounter: Payer: Self-pay | Admitting: Orthopaedic Surgery

## 2018-10-15 VITALS — BP 121/54 | HR 55 | Ht 61.0 in | Wt 134.0 lb

## 2018-10-15 DIAGNOSIS — M545 Low back pain, unspecified: Secondary | ICD-10-CM

## 2018-10-15 MED ORDER — HYDROCODONE-ACETAMINOPHEN 5-325 MG PO TABS: 1.0000 | ORAL_TABLET | Freq: Four times a day (QID) | ORAL | 0 refills | Status: AC | PRN

## 2018-10-15 MED ORDER — PREDNISONE 5 MG (21) PO TBPK: ORAL_TABLET | ORAL | 0 refills | Status: DC

## 2018-10-15 NOTE — Progress Notes (Addendum)
Patient Victoria Sims, female DOB:[birth year], 78 y.o. MPN:[id number]  Chief Complaint  Patient presents with  . Back Pain    HPI  Victoria Sims is a 78 y.o. female who has developed lower back pain over the last several weeks.  She has no trauma.  Her pain is over the left lower back with no radiation.  She has tried ice, heat, rubs and Motrin with little help.  She has seen Dr. Durward Fortes in the past.  She has no numbness, no bowel or bladder problems.   Body mass index is 25.32 kg/m.  ROS  Review of Systems  Constitutional: Positive for activity change.  Musculoskeletal: Positive for arthralgias and back pain.  All other systems reviewed and are negative.   All other systems reviewed and are negative.  The following is a summary of the past history medically, past history surgically, known current medicines, social history and family history.  This information is gathered electronically by the computer from prior information and documentation.  I review this each visit and have found including this information at this point in the chart is beneficial and informative.    Past Medical History:  Diagnosis Date  . Cancer ([place]) 3/07   right breast/ER/PR positive  . Cataract    and dry eye/spots on retina  . Cataract    surgery on right eye 04/12/14  . Condyloma 01/1998  . Depression   . Grave's disease   . Macular degeneration of both eyes   . Microhematuria 07/1992   negative IVP, [place]  . MRSA (methicillin resistant Staphylococcus aureus)   . Shingles     Past Surgical History:  Procedure Laterality Date  . ABDOMINAL HYSTERECTOMY    . BREAST BIOPSY  1990   left  . CATARACT EXTRACTION  04/12/14   right eye  . CATARACT EXTRACTION  2/16   left eye  . CHOLECYSTECTOMY    . EYE SURGERY    . goiter  7/93   radioactive iodine  . INGUINAL HERNIA REPAIR  09/29/2011   Procedure: HERNIA REPAIR INGUINAL ADULT;  Surgeon: Rolm Bookbinder, MD;  Location: [facility];   Service: General;  Laterality: Right;  . TUBAL LIGATION     D&C    Family History  Problem Relation Age of Onset  . Hypertension Mother   . Heart disease Mother   . Breast cancer Neg Hx     Social History Social History   Tobacco Use  . Smoking status: Current Every Day Smoker    Packs/day: 0.50    Years: 54.00    Pack years: 27.00    Types: Cigarettes  . Smokeless tobacco: Never Used  Substance Use Topics  . Alcohol use: No    Alcohol/week: 0.0 standard drinks  . Drug use: No    Allergies  Allergen Reactions  . Atorvastatin Other (See Comments)    UTIs  . Gabapentin Nausea And Vomiting  . Macrobid [Nitrofurantoin Macrocrystal] Other (See Comments)    hallucination  . Sulfa Antibiotics Rash    Current Outpatient Medications  Medication Sig Dispense Refill  . ALPRAZolam (XANAX) 1 MG tablet Take 1 tablet (1 mg total) by mouth at bedtime as needed for sleep. 30 tablet 1  . amoxicillin-clavulanate (AUGMENTIN) 500-125 MG tablet Take 1 tablet (500 mg total) by mouth 2 (two) times daily. 10 tablet 0  . aspirin 81 MG EC tablet Take 81 mg by mouth daily.  99  . bisacodyl (DULCOLAX) 5 MG EC tablet Take 5  mg by mouth daily as needed for moderate constipation.    . Calcium-Vitamin D (CALTRATE 600 PLUS-VIT D PO) Take 1 tablet by mouth daily.    . Cholecalciferol (D3-1000) 1000 UNITS capsule Take 1,000 Units by mouth daily.    Marland Kitchen escitalopram (LEXAPRO) 20 MG tablet Take 40 mg by mouth daily.    Marland Kitchen ezetimibe (ZETIA) 10 MG tablet Take 1 tablet (10 mg total) by mouth daily. 90 tablet 3  . furosemide (LASIX) 40 MG tablet Take 40 mg by mouth daily.    Marland Kitchen HYDROcodone-acetaminophen (NORCO/VICODIN) 5-325 MG tablet Take 1 tablet by mouth every 6 (six) hours as needed for up to 7 days for moderate pain. One tablet by mouth every six hours as needed for pain.  Seven day limit 28 tablet 0  . levothyroxine (SYNTHROID, LEVOTHROID) 88 MCG tablet Take 88 mcg by mouth daily.  3  . mupirocin ointment  (BACTROBAN) 2 % APPLY 1 APPLICATION INTO NOSE TWICE DAILY 22 g 0  . pantoprazole (PROTONIX) 40 MG tablet Take by mouth.    Marland Kitchen POTASSIUM PO Take 1 Tbsp by mouth daily    . predniSONE (STERAPRED UNI-PAK 21 TAB) 5 MG (21) TBPK tablet Take 6 pills first day; 5 pills second day; 4 pills third day; 3 pills fourth day; 2 pills next day and 1 pill last day. 21 tablet 0  . rosuvastatin (CRESTOR) 20 MG tablet TAKE 1 TABLET BY MOUTH EVERY DAY 90 tablet 0   No current facility-administered medications for this visit.      Physical Exam  Blood pressure (!) 121/54, pulse (!) 55, height 5\' 1"  (1.549 m), weight 134 lb (60.8 kg).  Constitutional: overall normal hygiene, normal nutrition, well developed, normal grooming, normal body habitus. Assistive device:none  Musculoskeletal: gait and station Limp none, muscle tone and strength are normal, no tremors or atrophy is present.  .  Neurological: coordination overall normal.  Deep tendon reflex/nerve stretch intact.  Sensation normal.  Cranial nerves II-XII intact.   Skin:   Normal overall no scars, lesions, ulcers or rashes. No psoriasis.  Psychiatric: Alert and oriented x 3.  Recent memory intact, remote memory unclear.  Normal mood and affect. Well groomed.  Good eye contact.  Cardiovascular: overall no swelling, no varicosities, no edema bilaterally, normal temperatures of the legs and arms, no clubbing, cyanosis and good capillary refill.  Spine/Pelvis examination:  Inspection:  Overall, sacoiliac joint benign and hips nontender; without crepitus or defects.   Thoracic spine inspection: Alignment normal without kyphosis present   Lumbar spine inspection:  Alignment  with normal lumbar lordosis, without scoliosis apparent.   Thoracic spine palpation:  without tenderness of spinal processes   Lumbar spine palpation: without tenderness of lumbar area; without tightness of lumbar muscles    Range of Motion:   Lumbar flexion, forward flexion is  normal without pain or tenderness.  There is some left lower back tenderness but no spasm or redness.   Lumbar extension is full without pain or tenderness   Left lateral bend is normal without pain or tenderness   Right lateral bend is normal without pain or tenderness   Straight leg raising is normal  Strength & tone: normal   Stability overall normal stability  Lymphatic: palpation is normal.  All other systems reviewed and are negative   The patient has been educated about the nature of the problem(s) and counseled on treatment options.  The patient appeared to understand what I have discussed and is in  agreement with it.  Encounter Diagnosis  Name Primary?  . Acute midline low back pain without sciatica Yes   X-rays were done of the lumbar spine showing diffuse degenerative joint disease, narrowing more at L3-L4, loss of lordosis of the lumbar spine, arteriosclerosis of the distal aorta and osteopenia of the bone.  No fracture was noted.  PLAN Call if any problems.  Precautions discussed.  I will begin Prednisone and pain medicine.  She is to begin Aleve one bid once she finishes the prednisone.    She has declined to go to PT.  I have gone over some flexion exercises for her to do. She appears to understand.  Return to clinic 2 weeks   She may need MRI of the lumbar spine if not improved.   Electronically Signed Sanjuana Kava, MD 6/10/202011:24 AM

## 2018-10-21 ENCOUNTER — Telehealth: Payer: Self-pay | Admitting: Orthopaedic Surgery

## 2018-10-21 NOTE — Telephone Encounter (Signed)
Attempted to contact pt. No new orders in system from our office. Left VM to call back if any further questions or concerns.

## 2018-10-21 NOTE — Telephone Encounter (Signed)
Patient returned call/message regarding insurance information or other information needed. Please call back [phone].

## 2018-10-22 ENCOUNTER — Other Ambulatory Visit: Payer: Self-pay

## 2018-10-22 ENCOUNTER — Encounter: Payer: Self-pay | Admitting: Orthopaedic Surgery

## 2018-10-22 ENCOUNTER — Ambulatory Visit (INDEPENDENT_AMBULATORY_CARE_PROVIDER_SITE_OTHER): Payer: Medicare Other | Admitting: Orthopaedic Surgery

## 2018-10-22 VITALS — Ht 61.0 in | Wt 134.0 lb

## 2018-10-22 DIAGNOSIS — M545 Low back pain, unspecified: Secondary | ICD-10-CM

## 2018-10-22 MED ORDER — LIDOCAINE HCL 1 % IJ SOLN
2.0000 mL | INTRAMUSCULAR | Status: AC | PRN
  Administered 2018-10-22: 2 mL

## 2018-10-22 MED ORDER — METHYLPREDNISOLONE ACETATE 40 MG/ML IJ SUSP
40.0000 mg | INTRAMUSCULAR | Status: AC | PRN
  Administered 2018-10-22: 40 mg via INTRAMUSCULAR

## 2018-10-22 NOTE — Progress Notes (Signed)
Office Visit Note   Patient: Victoria Sims           Date of Birth: Jul 17, 1940           MRN: [id number] Visit Date: 10/22/2018              Requested by: Holland Commons, [address],  [place] [address] PCP: Holland Commons, FNP   Assessment & Plan: Visit Diagnoses:  1. Acute left-sided low back pain without sciatica     Plan: Persistent left-sided low back pain without true radiculopathy.  Will inject the area of point tenderness with Depo-Medrol and apply lumbar support.  Reevaluate in the next 3 to 4 weeks if no improvement.  Total office visit about 30 minutes 50% of the time in counseling  Follow-Up Instructions: Return if symptoms worsen or fail to improve.   Orders:  Orders Placed This Encounter  Procedures  . Trigger Point Inj   No orders of the defined types were placed in this encounter.     Procedures: Trigger Point Inj  Date/Time: 10/22/2018 11:56 AM Performed by: Garald Balding, MD Authorized by: Garald Balding, MD   Consent Given by:  Patient Indications:  Pain Total # of Trigger Points:  1 Location: back   Needle Size:  25 G Approach:  Lateral Medications #1:  2 mL lidocaine 1 %; 40 mg methylPREDNISolone acetate 40 MG/ML Comments: Injected area of tenderness in the left paralumbar region near the iliac crest     Clinical Data: No additional findings.   Subjective: Chief Complaint  Patient presents with  . Right Shoulder - Follow-up  . Lower Back - Pain  Patient presents today for a follow up on her right shoulder. She received a cortisone injection 3 weeks ago. She said that her shoulder is still painful. Her main concern today is her lower back. She said that she was stepping out of a chair two week ago and had sudden pain. She initially thought she just pulled a muscle. The pain radiates down both legs, along with weakness. She saw Dr.Keeling last week and had x-rays. She said that he  prescribed her hydrocodone and prednisone. She has not had any relief yet.  Pain is localized along the left paralumbar region.  No lower extremity weakness.  No bowel or bladder changes  HPI  Review of Systems   Objective: Vital Signs: Ht 5\' 1"  (1.549 m)   Wt 134 lb (60.8 kg)   BMI 25.32 kg/m   Physical Exam Constitutional:      Appearance: She is well-developed.  Eyes:     Pupils: Pupils are equal, round, and reactive to light.  Pulmonary:     Effort: Pulmonary effort is normal.  Skin:    General: Skin is warm and dry.  Neurological:     Mental Status: She is alert and oriented to person, place, and time.  Psychiatric:        Behavior: Behavior normal.     Ortho Exam localized area of tenderness about the size of a half dollar in the left paralumbar region.  No masses.  Painless range of motion of both hips.  Some mild midline percussible tenderness throughout the lumbar spine.  Reflexes appear to be symmetrical.  Motor exam intact.  Specialty Comments:  No specialty comments available.  Imaging: No results found.   PMFS History: Patient Active Problem List   Diagnosis Date Noted  . Bilateral primary osteoarthritis of  knee 03/20/2017  . Chest discomfort 09/19/2016  . Heart palpitations 08/15/2016  . ASCVD (arteriosclerotic cardiovascular disease) 02/08/2016  . Claudication ([place]) 01/04/2016  . Dyslipidemia 01/04/2016  . Calcification of coronary artery 01/04/2016  . Chest pain, unspecified 01/04/2016  . Cystocele 06/06/2014  . Acid reflux 03/31/2014  . H/O malignant neoplasm of breast 03/31/2014  . H/O infectious disease 03/31/2014  . HLD (hyperlipidemia) 03/31/2014  . OP (osteoporosis) 03/31/2014  . Smoker 03/31/2014  . MRSA (methicillin resistant staph aureus) culture positive 12/04/2013  . History of DVT (deep vein thrombosis) 05/18/2013  . Cataract 01/20/2013  . 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 Urge incontinence 07/11/2011   Past Medical History:   Diagnosis Date  . Cancer ([place]) 3/07   right breast/ER/PR positive  . Cataract    and dry eye/spots on retina  . Cataract    surgery on right eye 04/12/14  . Condyloma 01/1998  . Depression   . Grave's disease   . Macular degeneration of both eyes   . Microhematuria 07/1992   negative IVP, [place]  . MRSA (methicillin resistant Staphylococcus aureus)   . Shingles     Family History  Problem Relation Age of Onset  . Hypertension Mother   . Heart disease Mother   . Breast cancer Neg Hx     Past Surgical History:  Procedure Laterality Date  . ABDOMINAL HYSTERECTOMY    . BREAST BIOPSY  1990   left  . CATARACT EXTRACTION  04/12/14   right eye  . CATARACT EXTRACTION  2/16   left eye  . CHOLECYSTECTOMY    . EYE SURGERY    . goiter  7/93   radioactive iodine  . INGUINAL HERNIA REPAIR  09/29/2011   Procedure: HERNIA REPAIR INGUINAL ADULT;  Surgeon: Rolm Bookbinder, MD;  Location: [facility];  Service: General;  Laterality: Right;  . TUBAL LIGATION     D&C   Social History   Occupational History  . Not on file  Tobacco Use  . Smoking status: Current Every Day Smoker    Packs/day: 0.50    Years: 54.00    Pack years: 27.00    Types: Cigarettes  . Smokeless tobacco: Never Used  Substance and Sexual Activity  . Alcohol use: No    Alcohol/week: 0.0 standard drinks  . Drug use: No  . Sexual activity: Yes    Partners: Male    Birth control/protection: Surgical    Comment: hysterectomy

## 2018-10-23 ENCOUNTER — Telehealth: Payer: Self-pay | Admitting: Radiology

## 2018-10-23 NOTE — Telephone Encounter (Signed)
Patient called LM and said she was returning our phone call.  I see where we tried to reach her on 10/21/18 about insurance, and she had not called back since then I assume.  I called her and LM that I was not sure who had called unless it was that, and if she needed [place] to call us.

## 2018-10-29 ENCOUNTER — Ambulatory Visit: Payer: Medicare Other

## 2018-10-29 ENCOUNTER — Ambulatory Visit: Payer: Medicare Other | Admitting: Orthopaedic Surgery

## 2018-11-26 DIAGNOSIS — E785 Hyperlipidemia, unspecified: Secondary | ICD-10-CM | POA: Diagnosis not present

## 2018-11-26 DIAGNOSIS — K219 Gastro-esophageal reflux disease without esophagitis: Secondary | ICD-10-CM | POA: Diagnosis not present

## 2018-11-26 DIAGNOSIS — E559 Vitamin D deficiency, unspecified: Secondary | ICD-10-CM | POA: Diagnosis not present

## 2018-11-26 DIAGNOSIS — R197 Diarrhea, unspecified: Secondary | ICD-10-CM | POA: Diagnosis not present

## 2018-11-26 DIAGNOSIS — E039 Hypothyroidism, unspecified: Secondary | ICD-10-CM | POA: Diagnosis not present

## 2018-12-04 ENCOUNTER — Ambulatory Visit: Payer: Medicare Other

## 2018-12-18 DIAGNOSIS — K921 Melena: Secondary | ICD-10-CM | POA: Diagnosis not present

## 2018-12-28 ENCOUNTER — Other Ambulatory Visit: Payer: Self-pay | Admitting: Internal Medicine

## 2018-12-28 DIAGNOSIS — E785 Hyperlipidemia, unspecified: Secondary | ICD-10-CM

## 2019-01-14 ENCOUNTER — Ambulatory Visit: Payer: Medicare Other

## 2019-01-29 ENCOUNTER — Other Ambulatory Visit: Payer: Self-pay

## 2019-01-29 ENCOUNTER — Ambulatory Visit
Admission: RE | Admit: 2019-01-29 | Discharge: 2019-01-29 | Disposition: A | Discharge status: Home / Self Care | Payer: Medicare Other | Source: Ambulatory Visit | Attending: Obstetrics & Gynecology | Admitting: Obstetrics & Gynecology

## 2019-01-29 DIAGNOSIS — Z1231 Encounter for screening mammogram for malignant neoplasm of breast: Secondary | ICD-10-CM | POA: Diagnosis not present

## 2019-01-29 HISTORY — DX: Personal history of irradiation: Z92.3

## 2019-03-09 DIAGNOSIS — Z23 Encounter for immunization: Secondary | ICD-10-CM | POA: Diagnosis not present

## 2019-03-12 ENCOUNTER — Ambulatory Visit: Payer: Medicare Other | Admitting: Internal Medicine

## 2019-03-28 ENCOUNTER — Other Ambulatory Visit: Payer: Self-pay | Admitting: Internal Medicine

## 2019-03-28 DIAGNOSIS — E785 Hyperlipidemia, unspecified: Secondary | ICD-10-CM

## 2019-04-22 ENCOUNTER — Ambulatory Visit: Payer: Medicare Other | Admitting: Internal Medicine

## 2019-05-20 ENCOUNTER — Encounter (INDEPENDENT_AMBULATORY_CARE_PROVIDER_SITE_OTHER): Payer: Self-pay

## 2019-05-20 ENCOUNTER — Encounter: Payer: Self-pay | Admitting: Internal Medicine

## 2019-05-20 ENCOUNTER — Ambulatory Visit (INDEPENDENT_AMBULATORY_CARE_PROVIDER_SITE_OTHER): Payer: Medicare Other | Admitting: Internal Medicine

## 2019-05-20 ENCOUNTER — Other Ambulatory Visit: Payer: Self-pay

## 2019-05-20 VITALS — BP 122/63 | HR 56 | Ht 61.0 in | Wt 148.0 lb

## 2019-05-20 DIAGNOSIS — R002 Palpitations: Secondary | ICD-10-CM | POA: Diagnosis not present

## 2019-05-20 DIAGNOSIS — E785 Hyperlipidemia, unspecified: Secondary | ICD-10-CM | POA: Diagnosis not present

## 2019-05-20 DIAGNOSIS — I2510 Atherosclerotic heart disease of native coronary artery without angina pectoris: Secondary | ICD-10-CM | POA: Diagnosis not present

## 2019-05-20 DIAGNOSIS — N183 Chronic kidney disease, stage 3 unspecified: Secondary | ICD-10-CM | POA: Diagnosis not present

## 2019-05-20 DIAGNOSIS — N390 Urinary tract infection, site not specified: Secondary | ICD-10-CM | POA: Diagnosis not present

## 2019-05-20 DIAGNOSIS — E039 Hypothyroidism, unspecified: Secondary | ICD-10-CM | POA: Diagnosis not present

## 2019-05-20 NOTE — Progress Notes (Signed)
OFFICE NOTE  Chief Complaint:  Chest pain  Primary Care Physician: Holland Commons, FNP  HPI:  Victoria Sims is a 79 y.o. female who is kindly referred to me for evaluation and management of recent statin intolerance. She's had problems with her digestive tract for some time and was placed on a statin recently due to some abnormal findings on a CT scan. This was a screening CT scan of her lungs as she is a smoker. It did not demonstrate any lung masses but did demonstrate coronary artery calcification. She was placed on statin therapy but to my knowledge has not had a cholesterol profile recently. She noted that the statins cause worsening constipation and stopped the medication. In addition she was sent to Dr. Irven Shelling office for an echocardiogram. This demonstrated a low normal EF of 50-55% with reportedly normal diastolic function, and trace tricuspid regurgitation with mild pulmonic regurgitation. 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 Her energy level has decreased as well. She denies any frank chest pain but does get short of breath doing certain activities. She also reports pain in her calf when walking that gets better at rest. This sometimes limits her walking long distances.  02/08/2016  Victoria Sims returns today for follow-up. She underwent a recent lipid profile in August 2017 which showed total cholesterol 285, LDL 208 and a very high LDL particle number of 20-21. April be level is 150 and LPa was elevated at 185. This indicates a marked mixed dyslipidemia. Unfortunately she had been intolerant to statins. She is currently on pravastatin 80 however I do not think this is likely to get her to her goal LDL. She also underwent stress testing which was negative for ischemia and lower extremity arterial Dopplers which showed normal ABIs and no evidence of abdominal aortic aneurysm given  her history of smoking.  08/15/2016  Victoria Sims returns for follow-up. She reports she been having some waves of symptoms a started in her stomach and go up into her chest. She does not think his reflux and does not describe it as pain or pressure. She says is very short-lived and can only go for a few seconds. She says it catches her reading and takes it away. She sometimes feels like her heart may race and I wonder if these are actually palpitations or perhaps PVCs. Nothing is noted on EKG today. She's had marked improvement in cholesterol on Lipitor 80 with LDL of 74, however she reports recent recurrent UTIs. She said after starting the medication she had foul-smelling urine has been treated with a number of antibiotics. No clear sources been found. Interestingly, this is listed as side effect of Lipitor although it was fairly uncommon (<1%).  09/19/2016  Victoria Sims was seen today in follow-up. She reports she still having waves started her stomach and come up to her throat. I don't believe the palpitations and she has these all throughout the day. While wearing a monitor she was noted to have some PACs but they were not that frequent. There is no evidence of A. fib or any other concerning arrhythmias. I do not think the symptoms are related to arrhythmia. It could be related to GI dysmotility, esophageal spasm or perhaps reflux, or or more likely anxiety which she seems to struggle with. I apprised her to consider trying the Xanax when she feels these episodes come on. With regards to  her UTIs, they have not returned off of Lipitor. Interestingly this is a listed side effect although very unlikely. Previously she was on pravastatin however with multivessel coronary artery calcification and other risk factors for goal LDL-C was less than 70 and her starting LDL was 208 therefore she was thought not to be able to reach goal based on that medication. Since she is now intolerant of atorvastatin, I would  recommend we try rosuvastatin. We'll initially start at the lowest "high potency" dose - 20 mg daily.   09/26/2017  Victoria Sims returns today for routine follow-up.  Overall she seems to be doing well.  She denies any recent palpitations.  As she is noted to have multivessel coronary calcification, her goal LDL is less than 70.  Most recently her LDL has remained above that in the mid to high 80s.  This is on maximally tolerated dose of rosuvastatin 20 mg daily.  She denies chest pain or worsening shortness of breath.  03/12/2018  Victoria Sims is seen today in routine follow-up.  She is doing fairly well.  She has been tolerating ezetimibe.  Recent labs in August demonstrated total cholesterol 128, HDL 60, LDL 58 and triglycerides 50.  She denies any chest pain or worsening shortness of breath.  She does report some claudication however it is minor and does not impair her walking.  I encouraged more walking.  She is followed by pulmonary for COPD as well as a lung nodule which is followed by CT.  05/20/2019  Victoria Sims is seen today in follow-up.  Overall she continues to do well.  Her cholesterol has been well controlled with LDL 62.  Blood pressure is at goal today 122/63.  EKG shows sinus bradycardia with some nonspecific T wave changes.  She denies any chest pain or worsening shortness of breath.   PMHx:  Past Medical History:  Diagnosis Date  . Breast cancer ([place]) 2006   right  . Cancer ([place]) 3/07   right breast/ER/PR positive  . Cataract    and dry eye/spots on retina  . Cataract    surgery on right eye 04/12/14  . Condyloma 01/1998  . Depression   . Grave's disease   . Macular degeneration of both eyes   . Microhematuria 07/1992   negative IVP, [place]  . MRSA (methicillin resistant Staphylococcus aureus)   . Personal history of radiation therapy   . Shingles     Past Surgical History:  Procedure Laterality Date  . ABDOMINAL HYSTERECTOMY    . BREAST BIOPSY Bilateral 1990  .  BREAST LUMPECTOMY Right   . CATARACT EXTRACTION  04/12/14   right eye  . CATARACT EXTRACTION  2/16   left eye  . CHOLECYSTECTOMY    . EYE SURGERY    . goiter  7/93   radioactive iodine  . INGUINAL HERNIA REPAIR  09/29/2011   Procedure: HERNIA REPAIR INGUINAL ADULT;  Surgeon: Rolm Bookbinder, MD;  Location: [facility];  Service: General;  Laterality: Right;  . TUBAL LIGATION     D&C    FAMHx:  Family History  Problem Relation Age of Onset  . Hypertension Mother   . Heart disease Mother   . Breast cancer Neg Hx     SOCHx:   reports that she has been smoking cigarettes. She has a 27.00 pack-year smoking history. She has never used smokeless tobacco. She reports that she does not drink alcohol or use drugs.  ALLERGIES:  Allergies  Allergen Reactions  .  Atorvastatin Other (See Comments)    UTIs  . Gabapentin Nausea And Vomiting  . Macrobid [Nitrofurantoin Macrocrystal] Other (See Comments)    hallucination  . Sulfa Antibiotics Rash    ROS: Pertinent items noted in HPI and remainder of comprehensive ROS otherwise negative.  HOME MEDS: Current Outpatient Medications  Medication Sig Dispense Refill  . ALPRAZolam (XANAX) 1 MG tablet Take 1 tablet (1 mg total) by mouth at bedtime as needed for sleep. 30 tablet 1  . amoxicillin-clavulanate (AUGMENTIN) 500-125 MG tablet Take 1 tablet (500 mg total) by mouth 2 (two) times daily. 10 tablet 0  . aspirin 81 MG EC tablet Take 81 mg by mouth daily.  99  . bisacodyl (DULCOLAX) 5 MG EC tablet Take 5 mg by mouth daily as needed for moderate constipation.    . Calcium-Vitamin D (CALTRATE 600 PLUS-VIT D PO) Take 1 tablet by mouth daily.    . Cholecalciferol (D3-1000) 1000 UNITS capsule Take 1,000 Units by mouth daily.    Marland Kitchen escitalopram (LEXAPRO) 20 MG tablet Take 40 mg by mouth daily.    Marland Kitchen ezetimibe (ZETIA) 10 MG tablet Take 1 tablet (10 mg total) by mouth daily. 90 tablet 3  . furosemide (LASIX) 40 MG tablet Take 40 mg by mouth daily.    Marland Kitchen  levothyroxine (SYNTHROID, LEVOTHROID) 88 MCG tablet Take 88 mcg by mouth daily.  3  . mupirocin ointment (BACTROBAN) 2 % APPLY 1 APPLICATION INTO NOSE TWICE DAILY 22 g 0  . pantoprazole (PROTONIX) 40 MG tablet Take by mouth.    Marland Kitchen POTASSIUM PO Take 1 Tbsp by mouth daily    . rosuvastatin (CRESTOR) 20 MG tablet TAKE 1 TABLET BY MOUTH EVERY DAY 90 tablet 0   No current facility-administered medications for this visit.    LABS/IMAGING: No results found for this or any previous visit (from the past 48 hour(s)). No results found.  WEIGHTS: Wt Readings from Last 3 Encounters:  05/20/19 148 lb (67.1 kg)  10/22/18 134 lb (60.8 kg)  10/15/18 134 lb (60.8 kg)    VITALS: BP 122/63   Pulse (!) 56   Ht 5\' 1"  (1.549 m)   Wt 148 lb (67.1 kg)   SpO2 99%   BMI 27.96 kg/m   EXAM: General appearance: alert and no distress Neck: no carotid bruit, no JVD and thyroid not enlarged, symmetric, no tenderness/mass/nodules Lungs: clear to auscultation bilaterally Heart: regular rate and rhythm Abdomen: soft, non-tender; bowel sounds normal; no masses,  no organomegaly Extremities: extremities normal, atraumatic, no cyanosis or edema Pulses: 2+ and symmetric Skin: Skin color, texture, turgor normal. No rashes or lesions Neurologic: Grossly normal Psych: Pleasant  EKG: Sinus bradycardia, nonspecific ST-T wave changes at 56-personally reviewed  ASSESSMENT: 1. Atypical chest pain-possible stricture 2. Palpitations 3. Fatigue and dyspnea - improved 4. Multivessel coronary artery calcium by CT 5. Dyslipidemia-not at goal less than 70 6. Low normal LVEF of 50% by echo in June 2017 7. Claudication  PLAN: 1.   Mrs. Bjorklund no acute issues.  She is a target LDL less than 70.  Although she has multivessel coronary calcium she is asymptomatic.  She also denies lifestyle limiting claudication.  Follow-up annually or sooner as necessary.  Pixie Casino, MD, [facility], [place] Director of the Advanced Lipid Disorders &  Cardiovascular Risk Reduction Clinic Diplomate of the American Board of Clinical Lipidology Attending Cardiologist  Direct Dial: [phone]  Fax: [phone]  Website:  [email or web address]  05/20/2019, 9:13 AM

## 2019-05-20 NOTE — Patient Instructions (Signed)
Medication Instructions:  NO CHANGES *If you need a refill on your cardiac medications before your next appointment, please call your pharmacy*  Lab Work: NONE If you have labs (blood work) drawn today and your tests are completely normal, you will receive your results only by: . MyChart Message (if you have MyChart) OR . A paper copy in the mail If you have any lab test that is abnormal or we need to change your treatment, we will call you to review the results.    Follow-Up: At [facility], you and your health needs are our priority.  As part of our continuing mission to provide you with exceptional heart care, we have created designated Provider Care Teams.  These Care Teams include your primary Cardiologist (physician) and Advanced Practice Providers (APPs -  Physician Assistants and Nurse Practitioners) who all work together to provide you with the care you need, when you need it.  Your next appointment:   12 month(s)  The format for your next appointment:   Either In Person or Virtual  Provider:   K. Chad Hilty, MD     

## 2019-06-03 ENCOUNTER — Ambulatory Visit (INDEPENDENT_AMBULATORY_CARE_PROVIDER_SITE_OTHER): Payer: Medicare Other

## 2019-06-03 ENCOUNTER — Ambulatory Visit: Payer: Self-pay

## 2019-06-03 ENCOUNTER — Ambulatory Visit (INDEPENDENT_AMBULATORY_CARE_PROVIDER_SITE_OTHER): Payer: Medicare Other | Admitting: Orthopaedic Surgery

## 2019-06-03 ENCOUNTER — Encounter: Payer: Self-pay | Admitting: Orthopaedic Surgery

## 2019-06-03 VITALS — Ht 61.0 in | Wt 148.0 lb

## 2019-06-03 DIAGNOSIS — M170 Bilateral primary osteoarthritis of knee: Secondary | ICD-10-CM

## 2019-06-03 DIAGNOSIS — I2510 Atherosclerotic heart disease of native coronary artery without angina pectoris: Secondary | ICD-10-CM | POA: Diagnosis not present

## 2019-06-03 DIAGNOSIS — M1711 Unilateral primary osteoarthritis, right knee: Secondary | ICD-10-CM

## 2019-06-03 DIAGNOSIS — M1712 Unilateral primary osteoarthritis, left knee: Secondary | ICD-10-CM

## 2019-06-03 MED ORDER — METHYLPREDNISOLONE ACETATE 40 MG/ML IJ SUSP
80.0000 mg | INTRAMUSCULAR | Status: AC | PRN
  Administered 2019-06-03: 10:00:00 80 mg via INTRA_ARTICULAR

## 2019-06-03 MED ORDER — LIDOCAINE HCL 1 % IJ SOLN
2.0000 mL | INTRAMUSCULAR | Status: AC | PRN
  Administered 2019-06-03: 2 mL

## 2019-06-03 MED ORDER — BUPIVACAINE HCL 0.5 % IJ SOLN
2.0000 mL | INTRAMUSCULAR | Status: AC | PRN
  Administered 2019-06-03: 2 mL via INTRA_ARTICULAR

## 2019-06-03 NOTE — Progress Notes (Signed)
Office Visit Note   Patient: Victoria Sims           Date of Birth: 1941/03/03           MRN: [id number] Visit Date: 06/03/2019              Requested by: Holland Commons, [address] [place],  [place] [address] PCP: Holland Commons, FNP   Assessment & Plan: Visit Diagnoses:  1. Bilateral primary osteoarthritis of knee     Plan: Advancement of osteoarthritis of both knees compared to films performed in 2018.  Long discussion regarding treatment options including knee replacement.  Victoria Sims would prefer to have a cortisone injection.  This was performed without difficulty and her left more symptomatic knee  Follow-Up Instructions: Return if symptoms worsen or fail to improve.   Orders:  Orders Placed This Encounter  Procedures  . XR KNEE 3 VIEW LEFT  . XR KNEE 3 VIEW RIGHT   No orders of the defined types were placed in this encounter.     Procedures: Large Joint Inj: L knee on 06/03/2019 9:31 AM Indications: pain and diagnostic evaluation Details: 25 G 1.5 in needle, anteromedial approach  Arthrogram: No  Medications: 2 mL lidocaine 1 %; 2 mL bupivacaine 0.5 %; 80 mg methylPREDNISolone acetate 40 MG/ML Procedure, treatment alternatives, risks and benefits explained, specific risks discussed. Consent was given by the patient. Patient was prepped and draped in the usual sterile fashion.       Clinical Data: No additional findings.   Subjective: Chief Complaint  Patient presents with  . Right Knee - Pain  . Left Knee - Pain  Patient presents today for recurrent bilateral knee pain. She was here in May of 2020 and received bilateral cortisone injections. She said that the injections helped. She has been experiencing a lot of popping in her left knee and states that it feels "like something is hanging". She takes Motrin for pain as needed.  Still has a chronic problem with balance but notes that her left knee is giving her "more  problem with popping and clicking.  HPI  Review of Systems   Objective: Vital Signs: Ht 5\' 1"  (1.549 m)   Wt 148 lb (67.1 kg)   BMI 27.96 kg/m   Physical Exam Constitutional:      Appearance: She is well-developed.  Eyes:     Pupils: Pupils are equal, round, and reactive to light.  Pulmonary:     Effort: Pulmonary effort is normal.  Skin:    General: Skin is warm and dry.  Neurological:     Mental Status: She is alert and oriented to person, place, and time.  Psychiatric:        Behavior: Behavior normal.     Ortho Exam alert and oriented x3.  Comfortable sitting.  Exam was localized to the left knee which was symptomatic.  There is significant patellar crepitation and pain particularly laterally with flexion extension.  Full extension and flexion over 105 degrees without instability.  Predominately medial more than lateral joint pain.  No obvious effusion.  No popliteal pain.  No calf discomfort.  No distal edema no pain with range of motion of left hip Specialty Comments:  No specialty comments available.  Imaging: XR KNEE 3 VIEW LEFT  Result Date: 06/03/2019 Films of the left knee were obtained in 3 projections standing and compared to the films performed in 2018.  There are progressive degenerative changes in  all 3 compartments but particularly medially and at the patellofemoral joint.  There is narrowing of the medial compartment with small peripheral osteophytes concerning chondral sclerosis.  Alignment looks to be normal.  Significant loss of the lateral patellofemoral joint space narrowing subchondral sclerosis and small osteophytes .  Films are consistent with advanced degenerative arthritis  XR KNEE 3 VIEW RIGHT  Result Date: 06/03/2019 Films of the right knee were obtained in 3 projections standing and compared to films performed in 2018.  There is progressive degenerative changes in all 3 compartments with more narrowing of the medial compartment associated with  subchondral sclerosis and peripheral osteophytes.  The joint space is not demonstrating any ectopic calcification.  There is severe degenerative change about the patella with bone-on-bone in the lateral compartment.  Consistent with advanced osteoarthritis.  No acute changes    PMFS History: Patient Active Problem List   Diagnosis Date Noted  . Bilateral primary osteoarthritis of knee 03/20/2017  . Chest discomfort 09/19/2016  . Heart palpitations 08/15/2016  . ASCVD (arteriosclerotic cardiovascular disease) 02/08/2016  . Claudication ([place]) 01/04/2016  . Dyslipidemia 01/04/2016  . Calcification of coronary artery 01/04/2016  . Chest pain, unspecified 01/04/2016  . Cystocele 06/06/2014  . Acid reflux 03/31/2014  . H/O malignant neoplasm of breast 03/31/2014  . H/O infectious disease 03/31/2014  . HLD (hyperlipidemia) 03/31/2014  . OP (osteoporosis) 03/31/2014  . Smoker 03/31/2014  . MRSA (methicillin resistant staph aureus) culture positive 12/04/2013  . History of DVT (deep vein thrombosis) 05/18/2013  . Cataract 01/20/2013  . 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 Urge incontinence 07/11/2011   Past Medical History:  Diagnosis Date  . Breast cancer ([place]) 2006   right  . Cancer ([place]) 3/07   right breast/ER/PR positive  . Cataract    and dry eye/spots on retina  . Cataract    surgery on right eye 04/12/14  . Condyloma 01/1998  . Depression   . Grave's disease   . Macular degeneration of both eyes   . Microhematuria 07/1992   negative IVP, [place]  . MRSA (methicillin resistant Staphylococcus aureus)   . Personal history of radiation therapy   . Shingles     Family History  Problem Relation Age of Onset  . Hypertension Mother   . Heart disease Mother   . Breast cancer Neg Hx     Past Surgical History:  Procedure Laterality Date  . ABDOMINAL HYSTERECTOMY    . BREAST BIOPSY Bilateral 1990  . BREAST LUMPECTOMY Right   . CATARACT EXTRACTION  04/12/14   right eye  . CATARACT  EXTRACTION  2/16   left eye  . CHOLECYSTECTOMY    . EYE SURGERY    . goiter  7/93   radioactive iodine  . INGUINAL HERNIA REPAIR  09/29/2011   Procedure: HERNIA REPAIR INGUINAL ADULT;  Surgeon: Rolm Bookbinder, MD;  Location: [facility];  Service: General;  Laterality: Right;  . TUBAL LIGATION     D&C   Social History   Occupational History  . Not on file  Tobacco Use  . Smoking status: Current Every Day Smoker    Packs/day: 0.50    Years: 54.00    Pack years: 27.00    Types: Cigarettes  . Smokeless tobacco: Never Used  Substance and Sexual Activity  . Alcohol use: No    Alcohol/week: 0.0 standard drinks  . Drug use: No  . Sexual activity: Yes    Partners: Male    Birth control/protection: Surgical  Comment: hysterectomy

## 2019-06-18 DIAGNOSIS — Z23 Encounter for immunization: Secondary | ICD-10-CM | POA: Diagnosis not present

## 2019-07-01 DIAGNOSIS — N390 Urinary tract infection, site not specified: Secondary | ICD-10-CM | POA: Diagnosis not present

## 2019-07-01 DIAGNOSIS — E785 Hyperlipidemia, unspecified: Secondary | ICD-10-CM | POA: Diagnosis not present

## 2019-07-01 DIAGNOSIS — K219 Gastro-esophageal reflux disease without esophagitis: Secondary | ICD-10-CM | POA: Diagnosis not present

## 2019-07-01 DIAGNOSIS — E039 Hypothyroidism, unspecified: Secondary | ICD-10-CM | POA: Diagnosis not present

## 2019-07-01 DIAGNOSIS — R7989 Other specified abnormal findings of blood chemistry: Secondary | ICD-10-CM | POA: Diagnosis not present

## 2019-07-01 DIAGNOSIS — F17200 Nicotine dependence, unspecified, uncomplicated: Secondary | ICD-10-CM | POA: Diagnosis not present

## 2019-07-01 DIAGNOSIS — Z1212 Encounter for screening for malignant neoplasm of rectum: Secondary | ICD-10-CM | POA: Diagnosis not present

## 2019-07-01 DIAGNOSIS — Z0000 Encounter for general adult medical examination without abnormal findings: Secondary | ICD-10-CM | POA: Diagnosis not present

## 2019-07-01 DIAGNOSIS — I2510 Atherosclerotic heart disease of native coronary artery without angina pectoris: Secondary | ICD-10-CM | POA: Diagnosis not present

## 2019-07-01 DIAGNOSIS — R2689 Other abnormalities of gait and mobility: Secondary | ICD-10-CM | POA: Diagnosis not present

## 2019-07-06 ENCOUNTER — Other Ambulatory Visit: Payer: Self-pay | Admitting: Internal Medicine

## 2019-07-06 DIAGNOSIS — E785 Hyperlipidemia, unspecified: Secondary | ICD-10-CM

## 2019-07-07 ENCOUNTER — Other Ambulatory Visit: Payer: Self-pay | Admitting: Internal Medicine

## 2019-07-07 DIAGNOSIS — F17209 Nicotine dependence, unspecified, with unspecified nicotine-induced disorders: Secondary | ICD-10-CM

## 2019-07-15 DIAGNOSIS — R2689 Other abnormalities of gait and mobility: Secondary | ICD-10-CM | POA: Diagnosis not present

## 2019-07-16 DIAGNOSIS — Z23 Encounter for immunization: Secondary | ICD-10-CM | POA: Diagnosis not present

## 2019-07-20 ENCOUNTER — Ambulatory Visit: Payer: Medicare Other

## 2019-07-22 ENCOUNTER — Ambulatory Visit
Admission: RE | Admit: 2019-07-22 | Discharge: 2019-07-22 | Disposition: A | Discharge status: Home / Self Care | Payer: Medicare Other | Source: Ambulatory Visit | Attending: Internal Medicine | Admitting: Internal Medicine

## 2019-07-22 DIAGNOSIS — F17210 Nicotine dependence, cigarettes, uncomplicated: Secondary | ICD-10-CM | POA: Diagnosis not present

## 2019-07-22 DIAGNOSIS — F17209 Nicotine dependence, unspecified, with unspecified nicotine-induced disorders: Secondary | ICD-10-CM

## 2019-07-23 DIAGNOSIS — R2689 Other abnormalities of gait and mobility: Secondary | ICD-10-CM | POA: Diagnosis not present

## 2019-07-28 DIAGNOSIS — R2689 Other abnormalities of gait and mobility: Secondary | ICD-10-CM | POA: Diagnosis not present

## 2019-07-30 ENCOUNTER — Telehealth: Payer: Self-pay | Admitting: Acute Care

## 2019-07-30 DIAGNOSIS — Z87891 Personal history of nicotine dependence: Secondary | ICD-10-CM

## 2019-07-30 DIAGNOSIS — F17210 Nicotine dependence, cigarettes, uncomplicated: Secondary | ICD-10-CM

## 2019-07-30 NOTE — Telephone Encounter (Signed)
Please call patient and let them  know their  low dose Ct was read as a Lung RADS 2: nodules that are benign in appearance and behavior with a very low likelihood of becoming a clinically active cancer due to size or lack of growth. Recommendation per radiology is for a repeat LDCT in 12 months..Please let them  know we will order and schedule their  annual screening scan for 07/2020. Please let them  know there was notation of CAD on their  scan.  Please remind the patient  that this is a non-gated exam therefore degree or severity of disease  cannot be determined. Please have them  follow up with their PCP regarding potential risk factor modification, dietary therapy or pharmacologic therapy if clinically indicated. Pt.  is  currently on statin therapy. Please place order for annual  screening scan for  07/2020 and fax results to PCP. Thanks so much.

## 2019-08-03 NOTE — Addendum Note (Signed)
Addended by: Doroteo Glassman D on: 08/03/2019 09:51 AM   Modules accepted: Orders

## 2019-08-03 NOTE — Telephone Encounter (Signed)
LMTC x 1  

## 2019-08-03 NOTE — Telephone Encounter (Signed)
Pt informed of CT results per Sarah Groce, NP.  PT verbalized understanding.  Copy sent to PCP.  Order placed for 1 yr f/u CT.  

## 2019-08-07 DIAGNOSIS — F17200 Nicotine dependence, unspecified, uncomplicated: Secondary | ICD-10-CM | POA: Diagnosis not present

## 2019-08-07 DIAGNOSIS — K5900 Constipation, unspecified: Secondary | ICD-10-CM | POA: Diagnosis not present

## 2019-08-12 ENCOUNTER — Other Ambulatory Visit: Payer: Self-pay | Admitting: Internal Medicine

## 2019-08-12 DIAGNOSIS — R1031 Right lower quadrant pain: Secondary | ICD-10-CM | POA: Diagnosis not present

## 2019-08-12 DIAGNOSIS — E876 Hypokalemia: Secondary | ICD-10-CM | POA: Diagnosis not present

## 2019-08-12 DIAGNOSIS — E032 Hypothyroidism due to medicaments and other exogenous substances: Secondary | ICD-10-CM | POA: Diagnosis not present

## 2019-08-12 DIAGNOSIS — R1912 Hyperactive bowel sounds: Secondary | ICD-10-CM | POA: Diagnosis not present

## 2019-08-12 DIAGNOSIS — E039 Hypothyroidism, unspecified: Secondary | ICD-10-CM | POA: Diagnosis not present

## 2019-08-18 ENCOUNTER — Other Ambulatory Visit: Payer: Medicare Other

## 2019-10-07 ENCOUNTER — Other Ambulatory Visit: Payer: Self-pay | Admitting: Internal Medicine

## 2019-11-11 DIAGNOSIS — N390 Urinary tract infection, site not specified: Secondary | ICD-10-CM | POA: Diagnosis not present

## 2019-11-11 DIAGNOSIS — R413 Other amnesia: Secondary | ICD-10-CM | POA: Diagnosis not present

## 2019-11-11 DIAGNOSIS — F419 Anxiety disorder, unspecified: Secondary | ICD-10-CM | POA: Diagnosis not present

## 2019-11-18 DIAGNOSIS — Z79899 Other long term (current) drug therapy: Secondary | ICD-10-CM | POA: Diagnosis not present

## 2019-11-18 DIAGNOSIS — E876 Hypokalemia: Secondary | ICD-10-CM | POA: Diagnosis not present

## 2019-11-18 DIAGNOSIS — E785 Hyperlipidemia, unspecified: Secondary | ICD-10-CM | POA: Diagnosis not present

## 2019-11-18 DIAGNOSIS — I2510 Atherosclerotic heart disease of native coronary artery without angina pectoris: Secondary | ICD-10-CM | POA: Diagnosis not present

## 2019-11-18 DIAGNOSIS — K5900 Constipation, unspecified: Secondary | ICD-10-CM | POA: Diagnosis not present

## 2019-11-27 DIAGNOSIS — R350 Frequency of micturition: Secondary | ICD-10-CM | POA: Diagnosis not present

## 2019-11-27 DIAGNOSIS — R319 Hematuria, unspecified: Secondary | ICD-10-CM | POA: Diagnosis not present

## 2019-11-27 DIAGNOSIS — N3941 Urge incontinence: Secondary | ICD-10-CM | POA: Diagnosis not present

## 2019-11-30 DIAGNOSIS — E876 Hypokalemia: Secondary | ICD-10-CM | POA: Diagnosis not present

## 2019-12-10 ENCOUNTER — Telehealth: Payer: Self-pay | Admitting: Neurology

## 2019-12-10 ENCOUNTER — Encounter: Payer: Self-pay | Admitting: Neurology

## 2019-12-10 ENCOUNTER — Ambulatory Visit (INDEPENDENT_AMBULATORY_CARE_PROVIDER_SITE_OTHER): Payer: Medicare Other | Admitting: Neurology

## 2019-12-10 VITALS — BP 109/78 | HR 56 | Ht 60.0 in | Wt 126.0 lb

## 2019-12-10 DIAGNOSIS — R413 Other amnesia: Secondary | ICD-10-CM

## 2019-12-10 MED ORDER — LORAZEPAM 1 MG PO TABS: ORAL_TABLET | ORAL | 0 refills | Status: DC

## 2019-12-10 NOTE — Progress Notes (Signed)
HISTORICAL  Victoria Sims is a 79 year old female, seen in request by her primary care nurse practitioner Holland Commons, for evaluation of memory loss, agitation, she is accompanied by her husband who has known her for 30 years at today's visit on December 10, 2019  I reviewed and summarized the referring note HLD. Longtime smoker, Hypothyroidism, on supplement,  Since 2020, she suffered frequent UTIs, during that period of time, she was noted to have worsening memory loss, initially attributed to her active infection, but her memory loss persistent,  She had a 16 years of education, used to work as a Radiation protection practitioner, since retirement many years ago, she likes to sit around in her house, watch TV, not very active  She tends to lose things, get irritable, difficult to finish conversation since her UTI in October 2020, become more noticeable following her most recent UTI in January 2021  Laboratory evaluations in January 2021, normal CBC hemoglobin of 13.5, CMP showed creatinine 1.6, frequent UTIs,  REVIEW OF SYSTEMS: Full 14 system review of systems performed and notable only for as above All other review of systems were negative.  ALLERGIES: Allergies  Allergen Reactions  . Atorvastatin Other (See Comments)    UTIs  . Gabapentin Nausea And Vomiting  . Macrobid [Nitrofurantoin Macrocrystal] Other (See Comments)    hallucination  . Sulfa Antibiotics Rash    HOME MEDICATIONS: Current Outpatient Medications  Medication Sig Dispense Refill  . aspirin 81 MG EC tablet Take 81 mg by mouth daily.  99  . Calcium-Vitamin D (CALTRATE 600 PLUS-VIT D PO) Take 1 tablet by mouth daily.    Marland Kitchen escitalopram (LEXAPRO) 20 MG tablet Take 40 mg by mouth daily.    Marland Kitchen ezetimibe (ZETIA) 10 MG tablet TAKE 1 TABLET BY MOUTH DAILY 90 tablet 2  . furosemide (LASIX) 40 MG tablet Take 40 mg by mouth daily.    Marland Kitchen levothyroxine (SYNTHROID, LEVOTHROID) 88 MCG tablet Take 88 mcg by mouth daily.  3  .  pantoprazole (PROTONIX) 40 MG tablet Take by mouth.    . rosuvastatin (CRESTOR) 20 MG tablet TAKE 1 TABLET BY MOUTH EVERY DAY 90 tablet 2   No current facility-administered medications for this visit.    PAST MEDICAL HISTORY: Past Medical History:  Diagnosis Date  . Breast cancer ([place]) 2006   right  . Cancer ([place]) 3/07   right breast/ER/PR positive  . Cataract    and dry eye/spots on retina  . Cataract    surgery on right eye 04/12/14  . Condyloma 01/1998  . Depression   . Grave's disease   . Macular degeneration of both eyes   . Microhematuria 07/1992   negative IVP, [place]  . MRSA (methicillin resistant Staphylococcus aureus)   . Personal history of radiation therapy   . Shingles     PAST SURGICAL HISTORY: Past Surgical History:  Procedure Laterality Date  . ABDOMINAL HYSTERECTOMY    . BREAST BIOPSY Bilateral 1990  . BREAST LUMPECTOMY Right   . CATARACT EXTRACTION  04/12/14   right eye  . CATARACT EXTRACTION  2/16   left eye  . CHOLECYSTECTOMY    . EYE SURGERY    . goiter  7/93   radioactive iodine  . INGUINAL HERNIA REPAIR  09/29/2011   Procedure: HERNIA REPAIR INGUINAL ADULT;  Surgeon: Rolm Bookbinder, MD;  Location: [facility];  Service: General;  Laterality: Right;  . TUBAL LIGATION     D&C    FAMILY HISTORY: Family History  Problem Relation Age of Onset  . Hypertension Mother   . Heart disease Mother   . Breast cancer Neg Hx     SOCIAL HISTORY: Social History   Socioeconomic History  . Marital status: Married    Spouse name: Not on file  . Number of children: Not on file  . Years of education: Not on file  . Highest education level: Not on file  Occupational History  . Not on file  Tobacco Use  . Smoking status: Current Every Day Smoker    Packs/day: 0.50    Years: 54.00    Pack years: 27.00    Types: Cigarettes  . Smokeless tobacco: Never Used  Vaping Use  . Vaping Use: Never used  Substance and Sexual Activity  . Alcohol use: No     Alcohol/week: 0.0 standard drinks  . Drug use: No  . Sexual activity: Yes    Partners: Male    Birth control/protection: Surgical    Comment: hysterectomy  Other Topics Concern  . Not on file  Social History Narrative  . Not on file   Social Determinants of Health   Financial Resource Strain:   . Difficulty of Paying Living Expenses:   Food Insecurity:   . Worried About Charity fundraiser in the Last Year:   . Arboriculturist in the Last Year:   Transportation Needs:   . Film/video editor (Medical):   Marland Kitchen Lack of Transportation (Non-Medical):   Physical Activity:   . Days of Exercise per Week:   . Minutes of Exercise per Session:   Stress:   . Feeling of Stress :   Social Connections:   . Frequency of Communication with Friends and Family:   . Frequency of Social Gatherings with Friends and Family:   . Attends Religious Services:   . Active Member of Clubs or Organizations:   . Attends Archivist Meetings:   Marland Kitchen Marital Status:   Intimate Partner Violence:   . Fear of Current or Ex-Partner:   . Emotionally Abused:   Marland Kitchen Physically Abused:   . Sexually Abused:      PHYSICAL EXAM   Vitals:   12/10/19 0734  BP: 109/78  Pulse: (!) 56  Weight: 126 lb (57.2 kg)  Height: 5' (1.524 m)   Not recorded     Body mass index is 24.61 kg/m.  PHYSICAL EXAMNIATION:  Gen: NAD, conversant, well nourised, well groomed                     Cardiovascular: Regular rate rhythm, no peripheral edema, warm, nontender. Eyes: Conjunctivae clear without exudates or hemorrhage Neck: Supple, no carotid bruits. Pulmonary: Clear to auscultation bilaterally   NEUROLOGICAL EXAM:  MENTAL STATUS:  MMSE - Mini Mental State Exam 12/10/2019  Orientation to time 2  Orientation to Place 3  Registration 3  Attention/ Calculation 5  Recall 2  Language- name 2 objects 2  Language- repeat 0  Language- follow 3 step command 2  Language- read & follow direction 1  Write a  sentence 1  Copy design 0  Total score 21   CRANIAL NERVES: CN II: Visual fields are full to confrontation. Pupils are round equal and briskly reactive to light. CN III, IV, VI: extraocular movement are normal. No ptosis. CN V: Facial sensation is intact to light touch CN VII: Face is symmetric with normal eye closure  CN VIII: Hearing is normal to causal conversation. CN IX,  X: Phonation is normal. CN XI: Head turning and shoulder shrug are intact  MOTOR: There is no pronator drift of out-stretched arms. Muscle bulk and tone are normal. Muscle strength is normal.  Bilateral lower extremity pitting edema  REFLEXES: Reflexes are 2+ and symmetric at the biceps, triceps, knees, and ankles. Plantar responses are flexor.  SENSORY: Intact to light touch, pinprick and vibratory sensation are intact in fingers and toes.  COORDINATION: There is no trunk or limb dysmetria noted.  GAIT/STANCE: Need to push up to get up from seated position, cautious,   DIAGNOSTIC DATA (LABS, IMAGING, TESTING) - I reviewed patient records, labs, notes, testing and imaging myself where available.   ASSESSMENT AND PLAN  Victoria Sims is a 79 y.o. female   Subacute onset of memory loss since beginning of 2021 per history, most noticeable during recurrent UTIs  Denies family history of dementia  Mini-Mental Status Examination 21/30, anxious and argumentative on examination, refused MRI  MRI of the brain to rule out structural lesion  Laboratory evaluation to rule out treatable etiology  Return to clinic in 2 to 3 months   Marcial Pacas, M.D. Ph.D.  [facility] [address] [place], [place] [address] Ph: [phone] Fax: [phone]  CC:  Holland Commons, [place],  [place] [address]

## 2019-12-10 NOTE — Telephone Encounter (Signed)
Medicare/[place] of [place] order sent to GI. No auth they will reach out to the patient to schedule.  

## 2019-12-11 ENCOUNTER — Telehealth: Payer: Self-pay | Admitting: Neurology

## 2019-12-11 ENCOUNTER — Other Ambulatory Visit: Payer: Self-pay | Admitting: Obstetrics & Gynecology

## 2019-12-11 DIAGNOSIS — Z1231 Encounter for screening mammogram for malignant neoplasm of breast: Secondary | ICD-10-CM

## 2019-12-11 LAB — COMPREHENSIVE METABOLIC PANEL
ALT: 19 IU/L (ref 0–32)
AST: 29 IU/L (ref 0–40)
Albumin/Globulin Ratio: 1.7 (ref 1.2–2.2)
Albumin: 4.3 g/dL (ref 3.7–4.7)
Alkaline Phosphatase: 67 IU/L (ref 48–121)
BUN/Creatinine Ratio: 18 (ref 12–28)
BUN: 22 mg/dL (ref 8–27)
Bilirubin Total: 0.6 mg/dL (ref 0.0–1.2)
CO2: 30 mmol/L — ABNORMAL HIGH (ref 20–29)
Calcium: 9.5 mg/dL (ref 8.7–10.3)
Chloride: 99 mmol/L (ref 96–106)
Creatinine, Ser: 1.19 mg/dL — ABNORMAL HIGH (ref 0.57–1.00)
GFR calc Af Amer: 51 mL/min/{1.73_m2} — ABNORMAL LOW (ref >59)
GFR calc non Af Amer: 44 mL/min/{1.73_m2} — ABNORMAL LOW (ref >59)
Globulin, Total: 2.5 g/dL (ref 1.5–4.5)
Glucose: 89 mg/dL (ref 65–99)
Potassium: 4.2 mmol/L (ref 3.5–5.2)
Sodium: 142 mmol/L (ref 134–144)
Total Protein: 6.8 g/dL (ref 6.0–8.5)

## 2019-12-11 LAB — CBC WITH DIFFERENTIAL/PLATELET
Basophils Absolute: 0.1 10*3/uL (ref 0.0–0.2)
Basos: 2 %
EOS (ABSOLUTE): 0.2 10*3/uL (ref 0.0–0.4)
Eos: 4 %
Hematocrit: 40.6 % (ref 34.0–46.6)
Hemoglobin: 13 g/dL (ref 11.1–15.9)
Immature Grans (Abs): 0 10*3/uL (ref 0.0–0.1)
Immature Granulocytes: 0 %
Lymphocytes Absolute: 1.2 10*3/uL (ref 0.7–3.1)
Lymphs: 20 %
MCH: 31.1 pg (ref 26.6–33.0)
MCHC: 32 g/dL (ref 31.5–35.7)
MCV: 97 fL (ref 79–97)
Monocytes Absolute: 0.6 10*3/uL (ref 0.1–0.9)
Monocytes: 10 %
Neutrophils Absolute: 3.9 10*3/uL (ref 1.4–7.0)
Neutrophils: 64 %
Platelets: 286 10*3/uL (ref 150–450)
RBC: 4.18 x10E6/uL (ref 3.77–5.28)
RDW: 12 % (ref 11.7–15.4)
WBC: 6 10*3/uL (ref 3.4–10.8)

## 2019-12-11 LAB — VITAMIN B12: Vitamin B-12: 240 pg/mL (ref 232–1245)

## 2019-12-11 LAB — RPR: RPR Ser Ql: NONREACTIVE

## 2019-12-11 LAB — TSH: TSH: 2.28 u[IU]/mL (ref 0.450–4.500)

## 2019-12-11 NOTE — Telephone Encounter (Signed)
I spoke to the patient and her husband. They both verbalized understanding of the lab results. Her husband said he would start the B12 and increase her water intake.

## 2019-12-11 NOTE — Telephone Encounter (Signed)
Please call patient, laboratory evaluation showed low normal B12, she would benefit over-the-counter B12 supplement, 1000 mcg daily Mild elevated creatinine 1.91, with GFR of 44, this is at her baseline, she should increase water intake  Rest of the laboratory evaluation showed no significant abnormalities.

## 2019-12-17 DIAGNOSIS — M47816 Spondylosis without myelopathy or radiculopathy, lumbar region: Secondary | ICD-10-CM | POA: Diagnosis not present

## 2019-12-17 DIAGNOSIS — N289 Disorder of kidney and ureter, unspecified: Secondary | ICD-10-CM | POA: Diagnosis not present

## 2019-12-17 DIAGNOSIS — R310 Gross hematuria: Secondary | ICD-10-CM | POA: Diagnosis not present

## 2019-12-17 DIAGNOSIS — M47814 Spondylosis without myelopathy or radiculopathy, thoracic region: Secondary | ICD-10-CM | POA: Diagnosis not present

## 2019-12-17 DIAGNOSIS — I739 Peripheral vascular disease, unspecified: Secondary | ICD-10-CM | POA: Diagnosis not present

## 2020-01-13 DIAGNOSIS — I2510 Atherosclerotic heart disease of native coronary artery without angina pectoris: Secondary | ICD-10-CM | POA: Diagnosis not present

## 2020-01-13 DIAGNOSIS — E785 Hyperlipidemia, unspecified: Secondary | ICD-10-CM | POA: Diagnosis not present

## 2020-01-13 DIAGNOSIS — E039 Hypothyroidism, unspecified: Secondary | ICD-10-CM | POA: Diagnosis not present

## 2020-01-13 DIAGNOSIS — R600 Localized edema: Secondary | ICD-10-CM | POA: Diagnosis not present

## 2020-01-13 DIAGNOSIS — R635 Abnormal weight gain: Secondary | ICD-10-CM | POA: Diagnosis not present

## 2020-01-13 DIAGNOSIS — F419 Anxiety disorder, unspecified: Secondary | ICD-10-CM | POA: Diagnosis not present

## 2020-01-13 DIAGNOSIS — R001 Bradycardia, unspecified: Secondary | ICD-10-CM | POA: Diagnosis not present

## 2020-01-13 DIAGNOSIS — E875 Hyperkalemia: Secondary | ICD-10-CM | POA: Diagnosis not present

## 2020-01-19 DIAGNOSIS — R351 Nocturia: Secondary | ICD-10-CM | POA: Diagnosis not present

## 2020-01-19 DIAGNOSIS — N281 Cyst of kidney, acquired: Secondary | ICD-10-CM | POA: Diagnosis not present

## 2020-01-20 DIAGNOSIS — E039 Hypothyroidism, unspecified: Secondary | ICD-10-CM | POA: Diagnosis not present

## 2020-01-20 DIAGNOSIS — N183 Chronic kidney disease, stage 3 unspecified: Secondary | ICD-10-CM | POA: Diagnosis not present

## 2020-01-20 DIAGNOSIS — E785 Hyperlipidemia, unspecified: Secondary | ICD-10-CM | POA: Diagnosis not present

## 2020-01-20 DIAGNOSIS — Z23 Encounter for immunization: Secondary | ICD-10-CM | POA: Diagnosis not present

## 2020-01-20 DIAGNOSIS — E875 Hyperkalemia: Secondary | ICD-10-CM | POA: Diagnosis not present

## 2020-01-27 NOTE — Progress Notes (Signed)
Erroneous

## 2020-01-28 ENCOUNTER — Ambulatory Visit: Payer: Self-pay | Admitting: Cardiology

## 2020-02-03 ENCOUNTER — Ambulatory Visit: Admission: RE | Admit: 2020-02-03 | Payer: Medicare Other | Source: Ambulatory Visit

## 2020-02-03 ENCOUNTER — Other Ambulatory Visit: Payer: Self-pay

## 2020-02-03 ENCOUNTER — Ambulatory Visit
Admission: RE | Admit: 2020-02-03 | Discharge: 2020-02-03 | Disposition: A | Discharge status: Home / Self Care | Payer: Medicare Other | Source: Ambulatory Visit | Attending: Obstetrics & Gynecology | Admitting: Obstetrics & Gynecology

## 2020-02-03 DIAGNOSIS — Z1231 Encounter for screening mammogram for malignant neoplasm of breast: Secondary | ICD-10-CM

## 2020-02-04 ENCOUNTER — Other Ambulatory Visit: Payer: Self-pay | Admitting: Nurse Practitioner

## 2020-02-04 ENCOUNTER — Other Ambulatory Visit: Payer: Self-pay | Admitting: Obstetrics & Gynecology

## 2020-02-04 DIAGNOSIS — N644 Mastodynia: Secondary | ICD-10-CM

## 2020-02-09 NOTE — Progress Notes (Signed)
Cardiology Office Note   Date:  02/10/2020   ID:  Victoria Sims, Victoria Sims October 07, 1940, MRN [id number]  PCP:  Holland Commons, FNP Cardiologist:  Pixie Casino, MD 05/20/2019 Electrphysiologist: None Rosaria Ferries, PA-C   History of Present Illness: Victoria Sims is a 79 y.o. female with a history of bradycardia, breast CA, memory issues (sees Neuro), Graves dz, shingles, HLD w/ statin intol, coronary Ca++ on chest CT  Victoria Sims presents for cardiology follow up.  Her husband is with her today.  She has problems w/ balance.  She says that she gets off whenever she stands up and moves around.  On further questioning, her symptoms sound orthostatic in nature.  Her husband says that she has problems when she gets up out of a chair or gets up out of bed.  She has to stay still to keep from falling.  He has not been checking her blood pressure at home.  He has not been weighing her every day, but says that he can.  She has chest pain and bilateral shoulder pain.  She has chest wall tenderness and tenderness over the anterior parts of both shoulders.  Pressure in these points produces her pain.  She is compliant with her medications.  She is taking Lasix 40 mg daily.  They live in [place].  It would be much more convenient for them to follow-up in the evening.  She has chronic pedal edema, but it appears to be mostly daytime edema.  They do not think she ever wakes with it.  She has had treatment for varicose veins at times in the past.  She denies orthopnea or PND.  She has chronic dyspnea on exertion but they do not think it has changed in the last few months  She does not sleep well, often getting up and spending part of the night on the couch.  But she and her husband deny that this is from shortness of breath.   Past Medical History:  Diagnosis Date  . Breast cancer ([place]) 2006   right  . Cancer ([place]) 3/07   right breast/ER/PR positive  . Cataract    and dry  eye/spots on retina  . Cataract    surgery on right eye 04/12/14  . Condyloma 01/1998  . Depression   . Grave's disease   . Macular degeneration of both eyes   . Microhematuria 07/1992   negative IVP, [place]  . MRSA (methicillin resistant Staphylococcus aureus)   . Personal history of radiation therapy   . Shingles     Past Surgical History:  Procedure Laterality Date  . ABDOMINAL HYSTERECTOMY    . BREAST BIOPSY Bilateral 1990  . BREAST LUMPECTOMY Right   . CATARACT EXTRACTION  04/12/14   right eye  . CATARACT EXTRACTION  2/16   left eye  . CHOLECYSTECTOMY    . EYE SURGERY    . goiter  7/93   radioactive iodine  . INGUINAL HERNIA REPAIR  09/29/2011   Procedure: HERNIA REPAIR INGUINAL ADULT;  Surgeon: Rolm Bookbinder, MD;  [facility];  Service: General;  Laterality: Right;  . TUBAL LIGATION     D&C    Current Outpatient Medications  Medication Sig Dispense Refill  . aspirin 81 MG EC tablet Take 81 mg by mouth daily.  99  . Calcium-Vitamin D (CALTRATE 600 PLUS-VIT D PO) Take 1 tablet by mouth daily.    Marland Kitchen escitalopram (LEXAPRO) 20 MG tablet Take 40 mg  by mouth daily.    Marland Kitchen ezetimibe (ZETIA) 10 MG tablet TAKE 1 TABLET BY MOUTH DAILY 90 tablet 2  . furosemide (LASIX) 40 MG tablet Take 40 mg by mouth daily.    Marland Kitchen levothyroxine (SYNTHROID, LEVOTHROID) 88 MCG tablet Take 88 mcg by mouth daily.  3  . LORazepam (ATIVAN) 1 MG tablet Take 1-2 tablets 30 minutes prior to MRI, may repeat once as needed. Must have driver. 4 tablet 0  . pantoprazole (PROTONIX) 40 MG tablet Take by mouth.    . rosuvastatin (CRESTOR) 20 MG tablet TAKE 1 TABLET BY MOUTH EVERY DAY 90 tablet 2   No current facility-administered medications for this visit.    Allergies:   Atorvastatin, Gabapentin, Macrobid [nitrofurantoin macrocrystal], and Sulfa antibiotics    Social History:  The patient  reports that she has been smoking cigarettes. She has a 27.00 pack-year smoking history. She has never used smokeless  tobacco. She reports that she does not drink alcohol and does not use drugs.   Family History:  The patient's family history includes Heart disease in her mother; Hypertension in her mother.  She indicated that her mother is deceased. She indicated that her father is deceased. She indicated that the status of her neg hx is unknown. She indicated that her other is alive.   ROS:  Please see the history of present illness. All other systems are reviewed and negative.    PHYSICAL EXAM: VS:  BP (!) 88/62   Pulse (!) 56   Ht 5\' 1"  (1.549 m)   Wt 154 lb (69.9 kg)   SpO2 98%   BMI 29.10 kg/m  , BMI Body mass index is 29.1 kg/m. GEN: Well nourished, well developed, female in no acute distress HEENT: normal for age  Neck: no JVD, no carotid bruit, no masses Cardiac: RRR; no murmur, no rubs, or gallops Respiratory: Few rales bases at first, then cleared with repeated inspirations, normal work of breathing, tenderness in the midsternal area GI: soft, nontender, nondistended, + BS MS: no deformity or atrophy; no edema; distal pulses are 2+ in all 4 extremities, tenderness on the anterior parts of both shoulders Skin: warm and dry, no rash Neuro:  Strength and sensation are intact Psych: euthymic mood, full affect   EKG:  EKG is ordered today. The ekg ordered today demonstrates Sinus brady, HR 56, no acute ischemic changes, no Q waves, normal intervals  ECHO: 2017 Done at Dr. Irven Shelling office.  This demonstrated a low normal EF of 50-55% with reportedly normal diastolic function, and trace tricuspid regurgitation with mild pulmonic regurgitation.   MYOVIEW: 01/25/2016 Myocardial perfusion is normal.   The study is normal.   This is a low risk study.  Overall left ventricular systolic function was normal.    LV cavity size is normal.  Nuclear stress EF:  57%.  The left ventricular ejection fraction is normal (55-65%).   MONITOR: 08/22/2016 Predominant sinus bradycardia - PAC's. No significant  pauses. Average HR 56. No a-fib.  Recent Labs: 12/10/2019: ALT 19; BUN 22; Creatinine, Ser 1.19; Hemoglobin 13.0; Platelets 286; Potassium 4.2; Sodium 142; TSH 2.280  CBC    Component Value Date/Time   WBC 6.0 12/10/2019 0830   WBC 5.6 08/02/2016 1508   RBC 4.18 12/10/2019 0830   RBC 4.57 08/02/2016 1508   HGB 13.0 12/10/2019 0830   HGB 13.4 09/18/2010 1112   HCT 40.6 12/10/2019 0830   HCT 39.3 09/18/2010 1112   PLT 286 12/10/2019 0830  MCV 97 12/10/2019 0830   MCV 91.9 09/18/2010 1112   MCH 31.1 12/10/2019 0830   MCH 31.1 08/02/2016 1508   MCHC 32.0 12/10/2019 0830   MCHC 34.1 08/02/2016 1508   RDW 12.0 12/10/2019 0830   RDW 12.6 09/18/2010 1112   LYMPHSABS 1.2 12/10/2019 0830   LYMPHSABS 1.1 09/18/2010 1112   MONOABS 0.7 09/29/2011 0331   MONOABS 0.3 09/18/2010 1112   EOSABS 0.2 12/10/2019 0830   BASOSABS 0.1 12/10/2019 0830   BASOSABS 0.0 09/18/2010 1112   CMP Latest Ref Rng & Units 12/10/2019 01/04/2016 11/04/2015  Glucose 65 - 99 mg/dL 89 88 88  BUN 8 - 27 mg/dL 22 29(H) 23  Creatinine 0.57 - 1.00 mg/dL 1.19(H) 1.20(H) 1.09(H)  Sodium 134 - 144 mmol/L 142 141 140  Potassium 3.5 - 5.2 mmol/L 4.2 3.8 3.9  Chloride 96 - 106 mmol/L 99 99 100  CO2 20 - 29 mmol/L 30(H) 28 28  Calcium 8.7 - 10.3 mg/dL 9.5 9.8 10.3  Total Protein 6.0 - 8.5 g/dL 6.8 7.0 6.7  Total Bilirubin 0.0 - 1.2 mg/dL 0.6 1.1 0.7  Alkaline Phos 48 - 121 IU/L 67 65 67  AST 0 - 40 IU/L 29 23 25   ALT 0 - 32 IU/L 19 13 23    Lipid Panel Lab Results  Component Value Date   CHOL 160 11/14/2016   HDL 62 11/14/2016   LDLCALC 83 11/14/2016   TRIG 74 11/14/2016   CHOLHDL 2.6 11/14/2016   Lab Results  Component Value Date   TSH 2.280 12/10/2019     Wt Readings from Last 3 Encounters:  02/10/20 154 lb (69.9 kg)  12/10/19 126 lb (57.2 kg)  06/03/19 148 lb (67.1 kg)     Other studies Reviewed: Additional studies/ records that were reviewed today include: Office notes, hospital records and  testing.  ASSESSMENT AND PLAN:  1.  Presyncope: -Her symptoms sound orthostatic in nature. -According to Dr. Lambert Mody notes, she was supposed to discontinue the Lasix but did not do so.  She was on it when he saw her 01/13/2020. -At that time, he continued the Lasix for lower extremity edema BUN was 25, creatinine 1.4 at that time, potassium 5.5. -She is not on potassium supplement. -She is to stop the Lasix at this time and use it on a as needed basis. -Her husband is encouraged to weigh her daily and give her the Lasix only if she gains 3 pounds in a day or 5 pounds in a week. -Her blood pressure is today, hopefully it will come up after she has been off the Lasix.  2.  Bradycardia: -It is not clear if she has chronotropic incompetence or not -Although her heart rate is low, I would not expect her to be symptomatic with a heart rate in the 50s -She is to get a 2-week monitor, it will be mailed to her. -Follow-up after that  3.  Chest pain: -She has significant chest wall tenderness in the midsternal area. -Try Voltaren gel for this, she can also use it on her shoulders if she wishes.  -No evidence of exertional chest pain, no ischemic evaluation indicated  4.  Lower extremity edema: -According to her husband, she does not usually wake with it.  She has pedal edema, but no significant edema on her lower legs. -She has a history of varicose veins and has had surgery for them -I encouraged her to wear compression stockings and explained why I thought that would help.  Current medicines are reviewed at length with the patient today.  The patient does not have concerns regarding medicines.  The following changes have been made: Change Lasix to as needed  Labs/ tests ordered today include:  No orders of the defined types were placed in this encounter.    Disposition:   FU in [place] in a month  Jonetta Speak, PA-C  02/10/2020 4:01 PM    [facility] Phone: [phone]; Fax: [phone]

## 2020-02-10 ENCOUNTER — Encounter: Payer: Self-pay | Admitting: Physician Assistant

## 2020-02-10 ENCOUNTER — Other Ambulatory Visit: Payer: Self-pay

## 2020-02-10 ENCOUNTER — Ambulatory Visit (INDEPENDENT_AMBULATORY_CARE_PROVIDER_SITE_OTHER): Payer: Medicare Other | Admitting: Physician Assistant

## 2020-02-10 VITALS — BP 88/62 | HR 56 | Ht 61.0 in | Wt 154.0 lb

## 2020-02-10 DIAGNOSIS — R0781 Pleurodynia: Secondary | ICD-10-CM | POA: Diagnosis not present

## 2020-02-10 DIAGNOSIS — R55 Syncope and collapse: Secondary | ICD-10-CM | POA: Diagnosis not present

## 2020-02-10 DIAGNOSIS — R600 Localized edema: Secondary | ICD-10-CM | POA: Diagnosis not present

## 2020-02-10 DIAGNOSIS — R001 Bradycardia, unspecified: Secondary | ICD-10-CM | POA: Diagnosis not present

## 2020-02-10 DIAGNOSIS — I2510 Atherosclerotic heart disease of native coronary artery without angina pectoris: Secondary | ICD-10-CM

## 2020-02-10 MED ORDER — DICLOFENAC SODIUM 1 % EX GEL: 4.0000 g | Freq: Three times a day (TID) | CUTANEOUS | 0 refills | Status: DC

## 2020-02-10 NOTE — Patient Instructions (Addendum)
Medication Instructions:   TAKE Lasix as needed   APPLY Voltaren gel 3 times a day to the affected area.  *If you need a refill on your cardiac medications before your next appointment, please call your pharmacy*  Lab Work: NONE ordered at this time of appointment   If you have labs (blood work) drawn today and your tests are completely normal, you will receive your results only by: Marland Kitchen MyChart Message (if you have MyChart) OR . A paper copy in the mail If you have any lab test that is abnormal or we need to change your treatment, we will call you to review the results.  Testing/Procedures: [facility]- Long Term Monitor Instructions   Your physician has requested you wear your ZIO patch monitor 14 days.   This is a single patch monitor.  [employer] supplies one patch monitor per enrollment.  Additional stickers are not available.   Please do not apply patch if you will be having a Nuclear Stress Test, Echocardiogram, Cardiac CT, MRI, or Chest Xray during the time frame you would be wearing the monitor. The patch cannot be worn during these tests.  You cannot remove and re-apply the ZIO XT patch monitor.   Your ZIO patch monitor will be sent USPS Priority mail from [facility] directly to your home address. The monitor may also be mailed to a PO BOX if home delivery is not available.   It may take 3-5 days to receive your monitor after you have been enrolled.   Once you have received you monitor, please review enclosed instructions.  Your monitor has already been registered assigning a specific monitor serial # to you.   Applying the monitor   Shave hair from upper left chest.   Hold abrader disc by orange tab.  Rub abrader in 40 strokes over left upper chest as indicated in your monitor instructions.   Clean area with 4 enclosed alcohol pads .  Use all pads to assure are is cleaned thoroughly.  Let dry.   Apply patch as indicated in monitor instructions.  Patch will be place under  collarbone on left side of chest with arrow pointing upward.   Rub patch adhesive wings for 2 minutes.Remove white label marked "1".  Remove white label marked "2".  Rub patch adhesive wings for 2 additional minutes.   While looking in a mirror, press and release button in center of patch.  A small green light will flash 3-4 times .  This will be your only indicator the monitor has been turned on.     Do not shower for the first 24 hours.  You may shower after the first 24 hours.   Press button if you feel a symptom. You will hear a small click.  Record Date, Time and Symptom in the Patient Log Book.   When you are ready to remove patch, follow instructions on last 2 pages of Patient Log Book.  Stick patch monitor onto last page of Patient Log Book.   Place Patient Log Book in [place] box.  Use locking tab on box and tape box closed securely.  The [employer] has [employer] on it.  Please place in mailbox as soon as possible.  Your physician should have your test results approximately 7 days after the monitor has been mailed back to [facility].   Call [facility] at [phone] if you have questions regarding your ZIO XT patch monitor.  Call them immediately if you  see an orange light blinking on your monitor.   If your monitor falls off in less than 4 days contact our Monitor department at [phone].  If your monitor becomes loose or falls off after 4 days call [employer] at [phone] for suggestions on securing your monitor.    Follow-Up: At [facility], you and your health needs are our priority.  As part of our continuing mission to provide you with exceptional heart care, we have created designated Provider Care Teams.  These Care Teams include your primary Cardiologist (physician) and Advanced Practice Providers (APPs -  Physician Assistants and Nurse Practitioners) who all work together to provide you with the care you need, when you need it.  We  recommend signing up for the patient portal called "MyChart".  Sign up information is provided on this After Visit Summary.  MyChart is used to connect with patients for Virtual Visits (Telemedicine).  Patients are able to view lab/test results, encounter notes, upcoming appointments, etc.  Non-urgent messages can be sent to your provider as well.   To learn more about what you can do with MyChart, go to [email or web address].    Your next appointment:   1 month(s)  The format for your next appointment:   In Person  Provider:   Katina Dung, NP  Other Instructions WEIGH DAILY, every am, wearing the same amount of clothing Record weights, take Lasix only for a weight gain of 3 lbs in a day or 5 lbs in a week Limit sodium to 500 mg per meal, total 2000 mg per day Limit all liquids to 1.5-2 liters/quarts per day

## 2020-02-16 ENCOUNTER — Telehealth: Payer: Self-pay | Admitting: *Deleted

## 2020-02-16 NOTE — Telephone Encounter (Signed)
Confirmed land address to have [employer] ship her 14 day ZIO XT long term holter monitor via [employer] to her home. Reviewed instructions briefly as they were printed out at office visit and will be included in monitor kit as well.

## 2020-02-18 ENCOUNTER — Other Ambulatory Visit (INDEPENDENT_AMBULATORY_CARE_PROVIDER_SITE_OTHER): Payer: Medicare Other

## 2020-02-18 DIAGNOSIS — R55 Syncope and collapse: Secondary | ICD-10-CM

## 2020-02-18 DIAGNOSIS — R001 Bradycardia, unspecified: Secondary | ICD-10-CM | POA: Diagnosis not present

## 2020-02-23 ENCOUNTER — Ambulatory Visit: Admission: RE | Admit: 2020-02-23 | Payer: Medicare Other | Source: Ambulatory Visit

## 2020-02-23 ENCOUNTER — Ambulatory Visit
Admission: RE | Admit: 2020-02-23 | Discharge: 2020-02-23 | Disposition: A | Discharge status: Home / Self Care | Payer: Medicare Other | Source: Ambulatory Visit | Attending: Obstetrics & Gynecology | Admitting: Obstetrics & Gynecology

## 2020-02-23 ENCOUNTER — Other Ambulatory Visit: Payer: Self-pay

## 2020-02-23 DIAGNOSIS — R922 Inconclusive mammogram: Secondary | ICD-10-CM | POA: Diagnosis not present

## 2020-02-23 DIAGNOSIS — N644 Mastodynia: Secondary | ICD-10-CM

## 2020-02-23 DIAGNOSIS — N6012 Diffuse cystic mastopathy of left breast: Secondary | ICD-10-CM | POA: Diagnosis not present

## 2020-03-11 DIAGNOSIS — R55 Syncope and collapse: Secondary | ICD-10-CM | POA: Diagnosis not present

## 2020-03-11 DIAGNOSIS — R001 Bradycardia, unspecified: Secondary | ICD-10-CM | POA: Diagnosis not present

## 2020-03-15 NOTE — Progress Notes (Deleted)
PATIENT: Victoria Sims DOB: [birth year]  REASON FOR VISIT: follow up HISTORY FROM: patient  HISTORY OF PRESENT ILLNESS: Today 03/15/20  HISTORY Victoria Sims is a 79 year old female, seen in request by her primary care nurse practitioner Holland Commons, for evaluation of memory loss, agitation, she is accompanied by her husband who has known her for 30 years at today's visit on December 10, 2019  I reviewed and summarized the referring note HLD. Longtime smoker, Hypothyroidism, on supplement,  Since 2020, she suffered frequent UTIs, during that period of time, she was noted to have worsening memory loss, initially attributed to her active infection, but her memory loss persistent,  She had a 16 years of education, used to work as a Radiation protection practitioner, since retirement many years ago, she likes to sit around in her house, watch TV, not very active  She tends to lose things, get irritable, difficult to finish conversation since her UTI in October 2020, become more noticeable following her most recent UTI in January 2021  Laboratory evaluations in January 2021, normal CBC hemoglobin of 13.5, CMP showed creatinine 1.6, frequent UTIs,  Update March 16, 2020 SS:    REVIEW OF SYSTEMS: Out of a complete 14 system review of symptoms, the patient complains only of the following symptoms, and all other reviewed systems are negative.  ALLERGIES: Allergies  Allergen Reactions  . Atorvastatin Other (See Comments)    UTIs  . Gabapentin Nausea And Vomiting  . Macrobid [Nitrofurantoin Macrocrystal] Other (See Comments)    hallucination  . Sulfa Antibiotics Rash    HOME MEDICATIONS: Outpatient Medications Prior to Visit  Medication Sig Dispense Refill  . aspirin 81 MG EC tablet Take 81 mg by mouth daily.  99  . Calcium-Vitamin D (CALTRATE 600 PLUS-VIT D PO) Take 1 tablet by mouth daily.    . diclofenac Sodium (VOLTAREN) 1 % GEL Apply 4 g topically in the morning, at noon,  and at bedtime. 4 g 0  . escitalopram (LEXAPRO) 20 MG tablet Take 40 mg by mouth daily.    Marland Kitchen ezetimibe (ZETIA) 10 MG tablet TAKE 1 TABLET BY MOUTH DAILY 90 tablet 2  . furosemide (LASIX) 40 MG tablet Take 40 mg by mouth as needed.    Marland Kitchen levothyroxine (SYNTHROID, LEVOTHROID) 88 MCG tablet Take 88 mcg by mouth daily.  3  . LORazepam (ATIVAN) 1 MG tablet Take 1-2 tablets 30 minutes prior to MRI, may repeat once as needed. Must have driver. 4 tablet 0  . pantoprazole (PROTONIX) 40 MG tablet Take by mouth.    . rosuvastatin (CRESTOR) 20 MG tablet TAKE 1 TABLET BY MOUTH EVERY DAY 90 tablet 2   No facility-administered medications prior to visit.    PAST MEDICAL HISTORY: Past Medical History:  Diagnosis Date  . Breast cancer ([place]) 2006   right  . Cancer ([place]) 3/07   right breast/ER/PR positive  . Cataract    and dry eye/spots on retina  . Cataract    surgery on right eye 04/12/14  . Condyloma 01/1998  . Depression   . Grave's disease   . Macular degeneration of both eyes   . Microhematuria 07/1992   negative IVP, [place]  . MRSA (methicillin resistant Staphylococcus aureus)   . Personal history of radiation therapy   . Shingles     PAST SURGICAL HISTORY: Past Surgical History:  Procedure Laterality Date  . ABDOMINAL HYSTERECTOMY    . BREAST BIOPSY Bilateral 1990  . BREAST LUMPECTOMY Right   .  CATARACT EXTRACTION  04/12/14   right eye  . CATARACT EXTRACTION  2/16   left eye  . CHOLECYSTECTOMY    . EYE SURGERY    . goiter  7/93   radioactive iodine  . INGUINAL HERNIA REPAIR  09/29/2011   Procedure: HERNIA REPAIR INGUINAL ADULT;  Surgeon: Rolm Bookbinder, MD;  Location: [facility];  Service: General;  Laterality: Right;  . TUBAL LIGATION     D&C    FAMILY HISTORY: Family History  Problem Relation Age of Onset  . Hypertension Mother   . Heart disease Mother   . Breast cancer Neg Hx     SOCIAL HISTORY: Social History   Socioeconomic History  . Marital status: Married     Spouse name: Not on file  . Number of children: Not on file  . Years of education: Not on file  . Highest education level: Not on file  Occupational History  . Not on file  Tobacco Use  . Smoking status: Current Every Day Smoker    Packs/day: 0.50    Years: 54.00    Pack years: 27.00    Types: Cigarettes  . Smokeless tobacco: Never Used  Vaping Use  . Vaping Use: Never used  Substance and Sexual Activity  . Alcohol use: No    Alcohol/week: 0.0 standard drinks  . Drug use: No  . Sexual activity: Yes    Partners: Male    Birth control/protection: Surgical    Comment: hysterectomy  Other Topics Concern  . Not on file  Social History Narrative  . Not on file   Social Determinants of Health   Financial Resource Strain:   . Difficulty of Paying Living Expenses: Not on file  Food Insecurity:   . Worried About Charity fundraiser in the Last Year: Not on file  . Ran Out of Food in the Last Year: Not on file  Transportation Needs:   . Lack of Transportation (Medical): Not on file  . Lack of Transportation (Non-Medical): Not on file  Physical Activity:   . Days of Exercise per Week: Not on file  . Minutes of Exercise per Session: Not on file  Stress:   . Feeling of Stress : Not on file  Social Connections:   . Frequency of Communication with Friends and Family: Not on file  . Frequency of Social Gatherings with Friends and Family: Not on file  . Attends Religious Services: Not on file  . Active Member of Clubs or Organizations: Not on file  . Attends Archivist Meetings: Not on file  . Marital Status: Not on file  Intimate Partner Violence:   . Fear of Current or Ex-Partner: Not on file  . Emotionally Abused: Not on file  . Physically Abused: Not on file  . Sexually Abused: Not on file      PHYSICAL EXAM  There were no vitals filed for this visit. There is no height or weight on file to calculate BMI.  Generalized: Well developed, in no acute distress    Neurological examination  Mentation: Alert oriented to time, place, history taking. Follows all commands speech and language fluent Cranial nerve II-XII: Pupils were equal round reactive to light. Extraocular movements were full, visual field were full on confrontational test. Facial sensation and strength were normal. Uvula tongue midline. Head turning and shoulder shrug  were normal and symmetric. Motor: The motor testing reveals 5 over 5 strength of all 4 extremities. Good symmetric motor tone is noted  throughout.  Sensory: Sensory testing is intact to soft touch on all 4 extremities. No evidence of extinction is noted.  Coordination: Cerebellar testing reveals good finger-nose-finger and heel-to-shin bilaterally.  Gait and station: Gait is normal. Tandem gait is normal. Romberg is negative. No drift is seen.  Reflexes: Deep tendon reflexes are symmetric and normal bilaterally.   DIAGNOSTIC DATA (LABS, IMAGING, TESTING) - I reviewed patient records, labs, notes, testing and imaging myself where available.  Lab Results  Component Value Date   WBC 6.0 12/10/2019   HGB 13.0 12/10/2019   HCT 40.6 12/10/2019   MCV 97 12/10/2019   PLT 286 12/10/2019      Component Value Date/Time   NA 142 12/10/2019 0830   K 4.2 12/10/2019 0830   CL 99 12/10/2019 0830   CO2 30 (H) 12/10/2019 0830   GLUCOSE 89 12/10/2019 0830   GLUCOSE 88 01/04/2016 1120   BUN 22 12/10/2019 0830   CREATININE 1.19 (H) 12/10/2019 0830   CREATININE 1.20 (H) 01/04/2016 1120   CALCIUM 9.5 12/10/2019 0830   PROT 6.8 12/10/2019 0830   ALBUMIN 4.3 12/10/2019 0830   AST 29 12/10/2019 0830   ALT 19 12/10/2019 0830   ALKPHOS 67 12/10/2019 0830   BILITOT 0.6 12/10/2019 0830   GFRNONAA 44 (L) 12/10/2019 0830   GFRNONAA 51 (L) 11/04/2015 0823   GFRAA 51 (L) 12/10/2019 0830   GFRAA 59 (L) 11/04/2015 0823   Lab Results  Component Value Date   CHOL 160 11/14/2016   HDL 62 11/14/2016   LDLCALC 83 11/14/2016   TRIG 74  11/14/2016   CHOLHDL 2.6 11/14/2016   No results found for: HGBA1C Lab Results  Component Value Date   VITAMINB12 240 12/10/2019   Lab Results  Component Value Date   TSH 2.280 12/10/2019      ASSESSMENT AND PLAN 79 y.o. year old female  has a past medical history of Breast cancer ([place]) (2006), Cancer ([place]) (3/07), Cataract, Cataract, Condyloma (01/1998), Depression, Grave's disease, Macular degeneration of both eyes, Microhematuria (07/1992), MRSA (methicillin resistant Staphylococcus aureus), Personal history of radiation therapy, and Shingles. here with:  1.  Subacute onset of memory loss since beginning of 2021, most notable during recurrent UTIs -Denies prominent family history of dementia -MMSE was -Laboratory evaluation revealed low B12 (240), recommended over-the-counter supplement 1000 mcg daily, creatinine chronic elevation 1.91, RPR and TSH unremarkable -Has refused MRI of the brain   I spent 15 minutes with the patient. 50% of this time was spent   Butler Denmark, [place], DNP 03/15/2020, 4:13 PM [facility] [address], [place], [place] [address] [phone]

## 2020-03-16 ENCOUNTER — Ambulatory Visit: Payer: Medicare Other | Admitting: Neurology

## 2020-03-16 ENCOUNTER — Encounter: Payer: Self-pay | Admitting: Neurology

## 2020-03-16 NOTE — Progress Notes (Signed)
Cardiology Office Note  Date: 03/17/2020   ID: Victoria Sims, Victoria Sims 12/16/1940, MRN [id number]  PCP:  Holland Commons, FNP  Cardiologist:  Pixie Casino, MD Electrophysiologist:  None   Chief Complaint: Follow-up presyncope  History of Present Illness: Victoria Sims is a 79 y.o. female with a history of presyncope, bradycardia, chest pain/pleuritic, lower extremity edema, breast CA, Graves' disease.  Last seen by Rosaria Ferries, PA-C 02/10/2020.  She was having problems with balance.  Felt off balance whenever she was up and moved around.  Her husband stated when getting up out of her chair or out of bed she had to stand still to keep from falling.  Had not been checking her blood pressure or weights at home.  Having chronic pedal edema mostly during the daytime.  History of varicose veins.  No complaints of PND, orthopnea.  Chronic dyspnea on exertion.  Not sleeping well.  She was supposed to have stopped Lasix but had not done so during that visit.  Orders were to stop the Lasix and only take it on as-needed basis.  She was to weigh the herself daily and daily and if gaining 3 pounds in a day or 5 pounds in a week.Marland Kitchen  Her heart rate was 56 blood pressure was 88/62 at that visit.  2-week event monitor was ordered.  She was having some significant chest wall tenderness in midsternal area.  She was trying Voltaren gel.  She was encouraged to wear compression stockings for lower extremity edema.  Patient is here for 1 month follow-up.  Continues to complain of some mild dizziness but no presyncopal or syncopal episodes.  Continues with mild lower extremity edema.  Blood pressure has improved since previous visit with Rosaria Ferries PA.  Heart rate is 64 today.  Blood pressure 100/60.  She denies any anginal or exertional symptoms, CVA or TIA-like symptoms, PND, orthopnea, bleeding, claudication-like symptoms, DVT or PE-like symptoms.  States she continues with some mild lower extremity  edema.  Past Medical History:  Diagnosis Date  . Breast cancer ([place]) 2006   right  . Cancer ([place]) 3/07   right breast/ER/PR positive  . Cataract    and dry eye/spots on retina  . Cataract    surgery on right eye 04/12/14  . Condyloma 01/1998  . Depression   . Grave's disease   . Macular degeneration of both eyes   . Microhematuria 07/1992   negative IVP, [place]  . MRSA (methicillin resistant Staphylococcus aureus)   . Personal history of radiation therapy   . Shingles     Past Surgical History:  Procedure Laterality Date  . ABDOMINAL HYSTERECTOMY    . BREAST BIOPSY Bilateral 1990  . BREAST LUMPECTOMY Right   . CATARACT EXTRACTION  04/12/14   right eye  . CATARACT EXTRACTION  2/16   left eye  . CHOLECYSTECTOMY    . EYE SURGERY    . goiter  7/93   radioactive iodine  . INGUINAL HERNIA REPAIR  09/29/2011   Procedure: HERNIA REPAIR INGUINAL ADULT;  Surgeon: Rolm Bookbinder, MD;  Location: [facility];  Service: General;  Laterality: Right;  . TUBAL LIGATION     D&C    Current Outpatient Medications  Medication Sig Dispense Refill  . aspirin 81 MG EC tablet Take 81 mg by mouth daily.  99  . Calcium-Vitamin D (CALTRATE 600 PLUS-VIT D PO) Take 1 tablet by mouth daily.    . diclofenac Sodium (VOLTAREN) 1 %  GEL Apply 4 g topically in the morning, at noon, and at bedtime. 4 g 0  . escitalopram (LEXAPRO) 20 MG tablet Take 40 mg by mouth daily.    Marland Kitchen ezetimibe (ZETIA) 10 MG tablet TAKE 1 TABLET BY MOUTH DAILY 90 tablet 2  . furosemide (LASIX) 40 MG tablet Take 40 mg by mouth as needed.    Marland Kitchen levothyroxine (SYNTHROID, LEVOTHROID) 88 MCG tablet Take 88 mcg by mouth daily.  3  . LORazepam (ATIVAN) 1 MG tablet Take 1-2 tablets 30 minutes prior to MRI, may repeat once as needed. Must have driver. 4 tablet 0  . pantoprazole (PROTONIX) 40 MG tablet Take by mouth.    . rosuvastatin (CRESTOR) 20 MG tablet TAKE 1 TABLET BY MOUTH EVERY DAY 90 tablet 2   No current facility-administered medications  for this visit.   Allergies:  Atorvastatin, Gabapentin, Macrobid [nitrofurantoin macrocrystal], and Sulfa antibiotics   Social History: The patient  reports that she has been smoking cigarettes. She has a 27.00 pack-year smoking history. She has never used smokeless tobacco. She reports that she does not drink alcohol and does not use drugs.   Family History: The patient's family history includes Heart disease in her mother; Hypertension in her mother.   ROS:  Please see the history of present illness. Otherwise, complete review of systems is positive for none.  All other systems are reviewed and negative.   Physical Exam: VS:  BP (!) 80/60   Pulse 62   Ht 5\' 1"  (1.549 m)   Wt 160 lb 3.2 oz (72.7 kg)   SpO2 97%   BMI 30.27 kg/m , BMI Body mass index is 30.27 kg/m.  Wt Readings from Last 3 Encounters:  03/17/20 160 lb 3.2 oz (72.7 kg)  02/10/20 154 lb (69.9 kg)  12/10/19 126 lb (57.2 kg)    General: Patient appears comfortable at rest. Neck: Supple, no elevated JVP or carotid bruits, no thyromegaly. Lungs: Clear to auscultation, nonlabored breathing at rest. Cardiac: Regular rate and rhythm, no S3 or significant systolic murmur, no pericardial rub. Extremities: No pitting edema, distal pulses 2+. Skin: Warm and dry. Musculoskeletal: No kyphosis. Neuropsychiatric: Alert and oriented x3, affect grossly appropriate.  ECG:    Recent Labwork: 12/10/2019: ALT 19; AST 29; BUN 22; Creatinine, Ser 1.19; Hemoglobin 13.0; Platelets 286; Potassium 4.2; Sodium 142; TSH 2.280     Component Value Date/Time   CHOL 160 11/14/2016 1025   CHOL 285 (H) 01/04/2016 1120   TRIG 74 11/14/2016 1025   TRIG 82 01/04/2016 1120   HDL 62 11/14/2016 1025   HDL 57 01/04/2016 1120   CHOLHDL 2.6 11/14/2016 1025   CHOLHDL 5.0 (H) 01/04/2016 1120   LDLCALC 83 11/14/2016 1025   LDLCALC 208 (H) 01/04/2016 1120    Other Studies Reviewed Today:   Event monitor 03/11/2020 Study Highlights Sinus rhythm  with 5 runs of PSVT, <11 beats. Lowest HR of 45 - no pauses or heart block.   ECHO: 2017 Done at [facility].  This demonstrated a low normal EF of 50-55% with reportedly normal diastolic function, andtracetricuspid regurgitation with mild pulmonic regurgitation.  MYOVIEW: 01/25/2016 Myocardial perfusion is normal. The study is normal. This is a low risk study. Overall left ventricular systolic function was normal. LV cavity size is normal. Nuclear stress EF: 57%. The left ventricular ejection fraction is normal (55-65%).   MONITOR: 08/22/2016 Predominant sinus bradycardia - PAC's. No significant pauses. Average HR 56. No a-fib.  Assessment and Plan:  1.  Postural dizziness with presyncope   2. Bradycardia   3. Chest pain, unspecified type   4. Lower extremity edema   5. Smoker    1. Postural dizziness with presyncope Continues with some complaints of mild dizziness when standing.. Lasix changed to prn last visit.  Initially patient's caregiver stated she had been taking furosemide every day and as needed.  After further discussion caregiver states she has only been taking the Lasix as needed for increased weight of 3 pounds per day or 5 pounds in a week.  Initial blood pressure on arrival was 80/60.  Repeat was 100/60. I reviewed the results of recent cardiac monitor : 5 episodes of  SVT less than 11 beats in duration. No pauses. Lowest HR 45.  Heart rate today is 64.  Advised decreasing the Lasix dose to 20 mg as needed.  She has no significant lower extremity edema.  2. Bradycardia HR 56 at last visit.  Heart rate today 64.  3. Chest pain, unspecified type Chest wall pain.  Currently denies any chest pain.  4. Lower extremity edema At last visit she was advised compression stockings.  Advised continuation.  5. Smoker She continues to smoke but states she is cut way down on the amount of cigarettes she is smoking.  Advised cessation if  possible.   Medication Adjustments/Labs and Tests Ordered: Current medicines are reviewed at length with the patient today.  Concerns regarding medicines are outlined above.   Disposition: Follow-up with Rosaria Ferries per patient's request.  Patient states she would like to follow-up with [facility] for her issues.  She states Dr. Debara Pickett is her primary cardiologist  Signed, Levell July, NP 03/17/2020 10:32 AM    [facility] [place], [place], [place] [address] Phone: [phone]; Fax: [phone]

## 2020-03-17 ENCOUNTER — Encounter: Payer: Self-pay | Admitting: Family Medicine

## 2020-03-17 ENCOUNTER — Ambulatory Visit (INDEPENDENT_AMBULATORY_CARE_PROVIDER_SITE_OTHER): Payer: Medicare Other | Admitting: Family Medicine

## 2020-03-17 VITALS — BP 100/60 | HR 64 | Ht 61.0 in | Wt 160.2 lb

## 2020-03-17 DIAGNOSIS — R600 Localized edema: Secondary | ICD-10-CM

## 2020-03-17 DIAGNOSIS — F17200 Nicotine dependence, unspecified, uncomplicated: Secondary | ICD-10-CM | POA: Diagnosis not present

## 2020-03-17 DIAGNOSIS — R55 Syncope and collapse: Secondary | ICD-10-CM

## 2020-03-17 DIAGNOSIS — R001 Bradycardia, unspecified: Secondary | ICD-10-CM

## 2020-03-17 DIAGNOSIS — R42 Dizziness and giddiness: Secondary | ICD-10-CM | POA: Diagnosis not present

## 2020-03-17 DIAGNOSIS — R079 Chest pain, unspecified: Secondary | ICD-10-CM

## 2020-03-17 MED ORDER — FUROSEMIDE 20 MG PO TABS: 20.0000 mg | ORAL_TABLET | Freq: Every day | ORAL | 3 refills | Status: DC | PRN

## 2020-03-17 NOTE — Patient Instructions (Addendum)
Medication Instructions:   Your physician has recommended you make the following change in your medication:   Decrease furosemide to 20 mg daily as needed  Continue other medications the same  Labwork:  None  Testing/Procedures:  None  Follow-Up:  Your physician recommends that you schedule a follow-up appointment in: [place] the next time Rosaria Ferries is there.  Any Other Special Instructions Will Be Listed Below (If Applicable).  If you need a refill on your cardiac medications before your next appointment, please call your pharmacy.

## 2020-04-06 DIAGNOSIS — N8110 Cystocele, unspecified: Secondary | ICD-10-CM | POA: Diagnosis not present

## 2020-04-06 DIAGNOSIS — D49512 Neoplasm of unspecified behavior of left kidney: Secondary | ICD-10-CM | POA: Diagnosis not present

## 2020-04-06 DIAGNOSIS — N3944 Nocturnal enuresis: Secondary | ICD-10-CM | POA: Diagnosis not present

## 2020-04-08 ENCOUNTER — Telehealth: Payer: Self-pay

## 2020-04-08 NOTE — Telephone Encounter (Addendum)
**Note De-Identified  Obfuscation** We received a Diclofenac PA request from [facility]. Per note in the pts med list Rosaria Ferries, PA-c gave 1 refill as a courtesy to the pt but we do not treat the pt for her need for his medication.  I called Cathy at [facility] and made her aware of this and recommended they send this PA request to the pts PCP, Thedora Hinders, FNP. Tye [place] thanked me for calling with this information and states that they will reach out to the pts PCP.

## 2020-05-11 NOTE — Progress Notes (Deleted)
Cardiology Office Note   Date:  05/11/2020   ID:  Avalina, Benko 1940-07-26, MRN [id number]  PCP:  Fatima Sanger, FNP Cardiologist:  Chrystie Nose, MD *** Electrphysiologist: None Theodore Demark, PA-C 02/10/2020 A. Vincenza Hews, NP, 03/17/2020  No chief complaint on file.   History of Present Illness: Victoria Sims is a 80 y.o. female with a history of bradycardia, breast CA, memory issues (sees Neuro), Graves dz, shingles, HLD w/ statin intol, coronary Ca++ on chest CT  11/11 visit, pt w/ minor dizziness and mild LE edema, BP  8060, then 100/60, Lasix decreased to 20 mg qd prn, lowest HR 45, because of SVT, no change in BB  Victoria Sims presents for ***  COVID status: un-vaccinated, had/did not have COVID Past Medical History:  Diagnosis Date  . Breast cancer (HCC) 2006   right  . Cancer (HCC) 3/07   right breast/ER/PR positive  . Cataract    and dry eye/spots on retina  . Cataract    surgery on right eye 04/12/14  . Condyloma 01/1998  . Depression   . Grave's disease   . Macular degeneration of both eyes   . Microhematuria 07/1992   negative IVP, [place]  . MRSA (methicillin resistant Staphylococcus aureus)   . Personal history of radiation therapy   . Shingles     Past Surgical History:  Procedure Laterality Date  . ABDOMINAL HYSTERECTOMY    . BREAST BIOPSY Bilateral 1990  . BREAST LUMPECTOMY Right   . CATARACT EXTRACTION  04/12/14   right eye  . CATARACT EXTRACTION  2/16   left eye  . CHOLECYSTECTOMY    . EYE SURGERY    . goiter  7/93   radioactive iodine  . INGUINAL HERNIA REPAIR  09/29/2011   Procedure: HERNIA REPAIR INGUINAL ADULT;  Surgeon: Emelia Loron, MD;  Location: [facility];  Service: General;  Laterality: Right;  . TUBAL LIGATION     D&C    Current Outpatient Medications  Medication Sig Dispense Refill  . aspirin 81 MG EC tablet Take 81 mg by mouth daily.  99  . Calcium-Vitamin D (CALTRATE 600 PLUS-VIT D PO) Take 1 tablet  by mouth daily.    . diclofenac Sodium (VOLTAREN) 1 % GEL Apply 4 g topically in the morning, at noon, and at bedtime. 4 g 0  . escitalopram (LEXAPRO) 20 MG tablet Take 40 mg by mouth daily.    Marland Kitchen ezetimibe (ZETIA) 10 MG tablet TAKE 1 TABLET BY MOUTH DAILY 90 tablet 2  . furosemide (LASIX) 20 MG tablet Take 1 tablet (20 mg total) by mouth daily as needed (swelling). 30 tablet 3  . levothyroxine (SYNTHROID, LEVOTHROID) 88 MCG tablet Take 88 mcg by mouth daily.  3  . LORazepam (ATIVAN) 1 MG tablet Take 1-2 tablets 30 minutes prior to MRI, may repeat once as needed. Must have driver. 4 tablet 0  . pantoprazole (PROTONIX) 40 MG tablet Take by mouth.    . rosuvastatin (CRESTOR) 20 MG tablet TAKE 1 TABLET BY MOUTH EVERY DAY 90 tablet 2   No current facility-administered medications for this visit.    Allergies:   Atorvastatin, Gabapentin, Macrobid [nitrofurantoin macrocrystal], and Sulfa antibiotics    Social History:  The patient  reports that she has been smoking cigarettes. She has a 27.00 pack-year smoking history. She has never used smokeless tobacco. She reports that she does not drink alcohol and does not use drugs.   Family History:  The patient's family history includes Heart disease in her mother; Hypertension in her mother.  She indicated that her mother is deceased. She indicated that her father is deceased. She indicated that the status of her neg hx is unknown. She indicated that her other is alive.    ROS:  Please see the history of present illness. All other systems are reviewed and negative.    PHYSICAL EXAM: VS:  There were no vitals taken for this visit. , BMI There is no height or weight on file to calculate BMI. GEN: Well nourished, well developed, female in no acute distress HEENT: normal for age  Neck: no JVD, no carotid bruit, no masses Cardiac: RRR; no murmur, no rubs, or gallops Respiratory:  clear to auscultation bilaterally, normal work of breathing GI: soft,  nontender, nondistended, + BS MS: no deformity or atrophy; no edema; distal pulses are 2+ in all 4 extremities  Skin: warm and dry, no rash Neuro:  Strength and sensation are intact Psych: euthymic mood, full affect   EKG:  EKG {ACTION; IS/IS GI:[id number] ordered today. The ekg ordered today demonstrates ***  Event monitor 03/11/2020 Study Highlights Sinus rhythm with 5 runs of PSVT, <11 beats. Lowest HR of 45 - no pauses or heart block.   [id number] Done atDr.Ganji'soffice.  This demonstrated a low normal EF of 50-55% with reportedly normal diastolic function, andtracetricuspid regurgitation with mild pulmonic regurgitation.  MYOVIEW:01/25/2016 Myocardial perfusion is normal. The study is normal. This is a low risk study. Overall left ventricular systolic function was normal. LV cavity size is normal. Nuclear stress EF: 57%. The left ventricular ejection fraction is normal (55-65%).  MONITOR:08/22/2016 Predominant sinus bradycardia - PAC's. No significant pauses. Average HR 56. No a-fib.  Recent Labs: 12/10/2019: ALT 19; BUN 22; Creatinine, Ser 1.19; Hemoglobin 13.0; Platelets 286; Potassium 4.2; Sodium 142; TSH 2.280  CBC    Component Value Date/Time   WBC 6.0 12/10/2019 0830   WBC 5.6 08/02/2016 1508   RBC 4.18 12/10/2019 0830   RBC 4.57 08/02/2016 1508   HGB 13.0 12/10/2019 0830   HGB 13.4 09/18/2010 1112   HCT 40.6 12/10/2019 0830   HCT 39.3 09/18/2010 1112   PLT 286 12/10/2019 0830   MCV 97 12/10/2019 0830   MCV 91.9 09/18/2010 1112   MCH 31.1 12/10/2019 0830   MCH 31.1 08/02/2016 1508   MCHC 32.0 12/10/2019 0830   MCHC 34.1 08/02/2016 1508   RDW 12.0 12/10/2019 0830   RDW 12.6 09/18/2010 1112   LYMPHSABS 1.2 12/10/2019 0830   LYMPHSABS 1.1 09/18/2010 1112   MONOABS 0.7 09/29/2011 0331   MONOABS 0.3 09/18/2010 1112   EOSABS 0.2 12/10/2019 0830   BASOSABS 0.1 12/10/2019 0830   BASOSABS 0.0 09/18/2010 1112   CMP Latest Ref Rng & Units  12/10/2019 01/04/2016 11/04/2015  Glucose 65 - 99 mg/dL 89 88 88  BUN 8 - 27 mg/dL 22 29(H) 23  Creatinine 0.57 - 1.00 mg/dL 1.19(H) 1.20(H) 1.09(H)  Sodium 134 - 144 mmol/L 142 141 140  Potassium 3.5 - 5.2 mmol/L 4.2 3.8 3.9  Chloride 96 - 106 mmol/L 99 99 100  CO2 20 - 29 mmol/L 30(H) 28 28  Calcium 8.7 - 10.3 mg/dL 9.5 9.8 10.3  Total Protein 6.0 - 8.5 g/dL 6.8 7.0 6.7  Total Bilirubin 0.0 - 1.2 mg/dL 0.6 1.1 0.7  Alkaline Phos 48 - 121 IU/L 67 65 67  AST 0 - 40 IU/L 29 23 25   ALT 0 - 32 IU/L 19  13 23     Lipid Panel Lab Results  Component Value Date   CHOL 160 11/14/2016   HDL 62 11/14/2016   LDLCALC 83 11/14/2016   TRIG 74 11/14/2016   CHOLHDL 2.6 11/14/2016      Wt Readings from Last 3 Encounters:  03/17/20 160 lb 3.2 oz (72.7 kg)  02/10/20 154 lb (69.9 kg)  12/10/19 126 lb (57.2 kg)     Other studies Reviewed: Additional studies/ records that were reviewed today include: Office notes, hospital records and testing.  ASSESSMENT AND PLAN:  1.  ***   Current medicines are reviewed at length with the patient today.  The patient {ACTIONS; HAS/DOES NOT HAVE:19233} concerns regarding medicines.  The following changes have been made:  {PLAN; NO CHANGE:13088:s}  Labs/ tests ordered today include:  No orders of the defined types were placed in this encounter.    Disposition:   FU with Pixie Casino, MD  Signed, Rosaria Ferries, PA-C  05/11/2020 4:27 PM    [place] Phone: [phone]; Fax: [phone]

## 2020-05-12 DIAGNOSIS — N3946 Mixed incontinence: Secondary | ICD-10-CM | POA: Diagnosis not present

## 2020-05-12 DIAGNOSIS — R351 Nocturia: Secondary | ICD-10-CM | POA: Diagnosis not present

## 2020-05-17 ENCOUNTER — Ambulatory Visit: Payer: Medicare Other | Admitting: Physician Assistant

## 2020-05-31 NOTE — Progress Notes (Deleted)
Cardiology Office Note   Date:  05/31/2020   ID:  Bhavna, Lomax Jul 07, 1940, MRN [id number]  PCP:  Fatima Sanger, FNP Cardiologist:  Chrystie Nose, MD 05/20/2019 Electrphysiologist: None Theodore Demark, PA-C 02/10/2020 A. Vincenza Hews, NP 03/17/2020  No chief complaint on file.   History of Present Illness: Victoria Sims is a 80 y.o. female with a history of of bradycardia, breast CA, memory issues (sees Neuro), Graves dz, shingles, HLD w/ statin intol, coronary Ca++ on chest CT  03/17/2020 visit, presyncope had improved, LE edema mild, BP 100/60, Lasix decreased to 20 mg daily prn, continue compression socks  Alison Murray presents for cardiology follow up.  ***  COVID status: un-vaccinated, had/did not have COVID Past Medical History:  Diagnosis Date  . Breast cancer (HCC) 2006   right  . Cancer (HCC) 3/07   right breast/ER/PR positive  . Cataract    and dry eye/spots on retina  . Cataract    surgery on right eye 04/12/14  . Condyloma 01/1998  . Depression   . Grave's disease   . Macular degeneration of both eyes   . Microhematuria 07/1992   negative IVP, [place]  . MRSA (methicillin resistant Staphylococcus aureus)   . Personal history of radiation therapy   . Shingles     Past Surgical History:  Procedure Laterality Date  . ABDOMINAL HYSTERECTOMY    . BREAST BIOPSY Bilateral 1990  . BREAST LUMPECTOMY Right   . CATARACT EXTRACTION  04/12/14   right eye  . CATARACT EXTRACTION  2/16   left eye  . CHOLECYSTECTOMY    . EYE SURGERY    . goiter  7/93   radioactive iodine  . INGUINAL HERNIA REPAIR  09/29/2011   Procedure: HERNIA REPAIR INGUINAL ADULT;  Surgeon: Emelia Loron, MD;  Location: [facility];  Service: General;  Laterality: Right;  . TUBAL LIGATION     D&C    Current Outpatient Medications  Medication Sig Dispense Refill  . aspirin 81 MG EC tablet Take 81 mg by mouth daily.  99  . Calcium-Vitamin D (CALTRATE 600 PLUS-VIT D PO) Take  1 tablet by mouth daily.    . diclofenac Sodium (VOLTAREN) 1 % GEL Apply 4 g topically in the morning, at noon, and at bedtime. 4 g 0  . escitalopram (LEXAPRO) 20 MG tablet Take 40 mg by mouth daily.    Marland Kitchen ezetimibe (ZETIA) 10 MG tablet TAKE 1 TABLET BY MOUTH DAILY 90 tablet 2  . furosemide (LASIX) 20 MG tablet Take 1 tablet (20 mg total) by mouth daily as needed (swelling). 30 tablet 3  . levothyroxine (SYNTHROID, LEVOTHROID) 88 MCG tablet Take 88 mcg by mouth daily.  3  . LORazepam (ATIVAN) 1 MG tablet Take 1-2 tablets 30 minutes prior to MRI, may repeat once as needed. Must have driver. 4 tablet 0  . pantoprazole (PROTONIX) 40 MG tablet Take by mouth.    . rosuvastatin (CRESTOR) 20 MG tablet TAKE 1 TABLET BY MOUTH EVERY DAY 90 tablet 2   No current facility-administered medications for this visit.    Allergies:   Atorvastatin, Gabapentin, Macrobid [nitrofurantoin macrocrystal], and Sulfa antibiotics    Social History:  The patient  reports that she has been smoking cigarettes. She has a 27.00 pack-year smoking history. She has never used smokeless tobacco. She reports that she does not drink alcohol and does not use drugs.   Family History:  The patient's family history includes Heart disease  in her mother; Hypertension in her mother.  She indicated that her mother is deceased. She indicated that her father is deceased. She indicated that the status of her neg hx is unknown. She indicated that her other is alive.    ROS:  Please see the history of present illness. All other systems are reviewed and negative.    PHYSICAL EXAM: VS:  There were no vitals taken for this visit. , BMI There is no height or weight on file to calculate BMI. GEN: Well nourished, well developed, female in no acute distress HEENT: normal for age  Neck: no JVD, no carotid bruit, no masses Cardiac: RRR; no murmur, no rubs, or gallops Respiratory:  clear to auscultation bilaterally, normal work of breathing GI:  soft, nontender, nondistended, + BS MS: no deformity or atrophy; no edema; distal pulses are 2+ in all 4 extremities  Skin: warm and dry, no rash Neuro:  Strength and sensation are intact Psych: euthymic mood, full affect   EKG:  EKG {ACTION; IS/IS PIR:[id number]} ordered today. The ekg ordered today demonstrates ***  Event monitor 03/11/2020 Study Highlights Sinus rhythm with 5 runs of PSVT, <11 beats. Lowest HR of 45 - no pauses or heart block.  AYTK:1601 Done atDr.Ganji'soffice.  This demonstrated a low normal EF of 50-55% with reportedly normal diastolic function, andtracetricuspid regurgitation with mild pulmonic regurgitation.  MYOVIEW:01/25/2016 Myocardial perfusion is normal. The study is normal. This is a low risk study. Overall left ventricular systolic function was normal. LV cavity size is normal. Nuclear stress EF: 57%. The left ventricular ejection fraction is normal (55-65%).  MONITOR:08/22/2016 Predominant sinus bradycardia - PAC's. No significant pauses. Average HR 56. No a-fib.  Recent Labs: 12/10/2019: ALT 19; BUN 22; Creatinine, Ser 1.19; Hemoglobin 13.0; Platelets 286; Potassium 4.2; Sodium 142; TSH 2.280  CBC    Component Value Date/Time   WBC 6.0 12/10/2019 0830   WBC 5.6 08/02/2016 1508   RBC 4.18 12/10/2019 0830   RBC 4.57 08/02/2016 1508   HGB 13.0 12/10/2019 0830   HGB 13.4 09/18/2010 1112   HCT 40.6 12/10/2019 0830   HCT 39.3 09/18/2010 1112   PLT 286 12/10/2019 0830   MCV 97 12/10/2019 0830   MCV 91.9 09/18/2010 1112   MCH 31.1 12/10/2019 0830   MCH 31.1 08/02/2016 1508   MCHC 32.0 12/10/2019 0830   MCHC 34.1 08/02/2016 1508   RDW 12.0 12/10/2019 0830   RDW 12.6 09/18/2010 1112   LYMPHSABS 1.2 12/10/2019 0830   LYMPHSABS 1.1 09/18/2010 1112   MONOABS 0.7 09/29/2011 0331   MONOABS 0.3 09/18/2010 1112   EOSABS 0.2 12/10/2019 0830   BASOSABS 0.1 12/10/2019 0830   BASOSABS 0.0 09/18/2010 1112   CMP Latest Ref Rng &  Units 12/10/2019 01/04/2016 11/04/2015  Glucose 65 - 99 mg/dL 89 88 88  BUN 8 - 27 mg/dL 22 29(H) 23  Creatinine 0.57 - 1.00 mg/dL 1.19(H) 1.20(H) 1.09(H)  Sodium 134 - 144 mmol/L 142 141 140  Potassium 3.5 - 5.2 mmol/L 4.2 3.8 3.9  Chloride 96 - 106 mmol/L 99 99 100  CO2 20 - 29 mmol/L 30(H) 28 28  Calcium 8.7 - 10.3 mg/dL 9.5 9.8 10.3  Total Protein 6.0 - 8.5 g/dL 6.8 7.0 6.7  Total Bilirubin 0.0 - 1.2 mg/dL 0.6 1.1 0.7  Alkaline Phos 48 - 121 IU/L 67 65 67  AST 0 - 40 IU/L 29 23 25   ALT 0 - 32 IU/L 19 13 23      Lipid Panel  Lab Results  Component Value Date   CHOL 160 11/14/2016   HDL 62 11/14/2016   LDLCALC 83 11/14/2016   TRIG 74 11/14/2016   CHOLHDL 2.6 11/14/2016      Wt Readings from Last 3 Encounters:  03/17/20 160 lb 3.2 oz (72.7 kg)  02/10/20 154 lb (69.9 kg)  12/10/19 126 lb (57.2 kg)     Other studies Reviewed: Additional studies/ records that were reviewed today include: Office notes, hospital records and testing.  ASSESSMENT AND PLAN:  1.  ***   Current medicines are reviewed at length with the patient today.  The patient {ACTIONS; HAS/DOES NOT HAVE:19233} concerns regarding medicines.  The following changes have been made:  {PLAN; NO CHANGE:13088:s}  Labs/ tests ordered today include:  No orders of the defined types were placed in this encounter.    Disposition:   FU with Pixie Casino, MD  Signed, Rosaria Ferries, PA-C  05/31/2020 7:14 PM    [facility] Phone: [phone]; Fax: [phone]

## 2020-06-06 ENCOUNTER — Ambulatory Visit: Payer: Medicare Other | Admitting: Physician Assistant

## 2020-06-07 ENCOUNTER — Other Ambulatory Visit: Payer: Self-pay | Admitting: Internal Medicine

## 2020-06-15 NOTE — Progress Notes (Signed)
Cardiology Office Note  Date: 06/17/2020   ID: [name], [name] 1940-05-16, MRN [id number]  PCP:  Holland Commons, FNP  Cardiologist:  Pixie Casino, MD Electrophysiologist:  None   Chief Complaint: Follow-up presyncope  History of Present Illness: Victoria Sims is a 80 y.o. female with a history of presyncope, bradycardia, chest pain/pleuritic, lower extremity edema, breast CA, Graves' disease.    Past Medical History:  Diagnosis Date  . Breast cancer ([place]) 2006   right  . Cancer ([place]) 3/07   right breast/ER/PR positive  . Cataract    and dry eye/spots on retina  . Cataract    surgery on right eye 04/12/14  . Condyloma 01/1998  . Depression   . Grave's disease   . Macular degeneration of both eyes   . Microhematuria 07/1992   negative IVP, [place]  . MRSA (methicillin resistant Staphylococcus aureus)   . Personal history of radiation therapy   . Shingles     Past Surgical History:  Procedure Laterality Date  . ABDOMINAL HYSTERECTOMY    . BREAST BIOPSY Bilateral 1990  . BREAST LUMPECTOMY Right   . CATARACT EXTRACTION  04/12/14   right eye  . CATARACT EXTRACTION  2/16   left eye  . CHOLECYSTECTOMY    . EYE SURGERY    . goiter  7/93   radioactive iodine  . INGUINAL HERNIA REPAIR  09/29/2011   Procedure: HERNIA REPAIR INGUINAL ADULT;  Surgeon: Rolm Bookbinder, MD;  Location: [facility];  Service: General;  Laterality: Right;  . TUBAL LIGATION     D&C    Current Outpatient Medications  Medication Sig Dispense Refill  . aspirin 81 MG EC tablet Take 81 mg by mouth daily.  99  . Calcium-Vitamin D (CALTRATE 600 PLUS-VIT D PO) Take 1 tablet by mouth daily.    . diclofenac Sodium (VOLTAREN) 1 % GEL Apply 4 g topically in the morning, at noon, and at bedtime. 4 g 0  . escitalopram (LEXAPRO) 20 MG tablet Take 40 mg by mouth daily.    Marland Kitchen ezetimibe (ZETIA) 10 MG tablet TAKE 1 TABLET BY MOUTH DAILY 21 tablet 2  . furosemide (LASIX) 20 MG tablet Take 1 tablet  (20 mg total) by mouth daily as needed (swelling). 30 tablet 3  . levothyroxine (SYNTHROID, LEVOTHROID) 88 MCG tablet Take 88 mcg by mouth daily.  3  . LORazepam (ATIVAN) 1 MG tablet Take 1-2 tablets 30 minutes prior to MRI, may repeat once as needed. Must have driver. 4 tablet 0  . pantoprazole (PROTONIX) 40 MG tablet Take by mouth.    . rosuvastatin (CRESTOR) 20 MG tablet TAKE 1 TABLET BY MOUTH EVERY DAY 90 tablet 2   No current facility-administered medications for this visit.   Allergies:  Atorvastatin, Gabapentin, Macrobid [nitrofurantoin macrocrystal], and Sulfa antibiotics   Social History: The patient  reports that she has been smoking cigarettes. She has a 27.00 pack-year smoking history. She has never used smokeless tobacco. She reports that she does not drink alcohol and does not use drugs.   Family History: The patient's family history includes Heart disease in her mother; Hypertension in her mother.   ROS:  Please see the history of present illness. Otherwise, complete review of systems is positive for none.  All other systems are reviewed and negative.   Physical Exam: VS:  BP 114/78   Pulse (!) 58   Ht 5\' 1"  (1.549 m)   Wt 160 lb (72.6 kg)  Comment: weight approx 1 mo in [place]  SpO2 96%   BMI 30.23 kg/m , BMI Body mass index is 30.23 kg/m.  Wt Readings from Last 3 Encounters:  06/17/20 160 lb (72.6 kg)  03/17/20 160 lb 3.2 oz (72.7 kg)  02/10/20 154 lb (69.9 kg)    General: Patient appears comfortable at rest. Neck: Supple, no elevated JVP or carotid bruits, no thyromegaly. Lungs: Clear to auscultation, nonlabored breathing at rest. Cardiac: Regular rate and rhythm, no S3 or significant systolic murmur, no pericardial rub. Extremities: No pitting edema, distal pulses 2+. Skin: Warm and dry. Musculoskeletal: No kyphosis. Neuropsychiatric: Alert and oriented x3, affect grossly appropriate.  ECG:    Recent Labwork: 12/10/2019: ALT 19; AST 29; BUN 22; Creatinine, Ser  1.19; Hemoglobin 13.0; Platelets 286; Potassium 4.2; Sodium 142; TSH 2.280     Component Value Date/Time   CHOL 160 11/14/2016 1025   CHOL 285 (H) 01/04/2016 1120   TRIG 74 11/14/2016 1025   TRIG 82 01/04/2016 1120   HDL 62 11/14/2016 1025   HDL 57 01/04/2016 1120   CHOLHDL 2.6 11/14/2016 1025   CHOLHDL 5.0 (H) 01/04/2016 1120   LDLCALC 83 11/14/2016 1025   LDLCALC 208 (H) 01/04/2016 1120    Other Studies Reviewed Today:   Event monitor 03/11/2020 Study Highlights Sinus rhythm with 5 runs of PSVT, <11 beats. Lowest HR of 45 - no pauses or heart block.   ECHO: 2017 Done at [place].  This demonstrated a low normal EF of 50-55% with reportedly normal diastolic function, andtracetricuspid regurgitation with mild pulmonic regurgitation.  MYOVIEW: 01/25/2016 Myocardial perfusion is normal. The study is normal. This is a low risk study. Overall left ventricular systolic function was normal. LV cavity size is normal. Nuclear stress EF: 57%. The left ventricular ejection fraction is normal (55-65%).   MONITOR: 08/22/2016 Predominant sinus bradycardia - PAC's. No significant pauses. Average HR 56. No a-fib.  Assessment and Plan:   1. Postural dizziness with presyncope Previous  monitor : 5 episodes of  SVT less than 11 beats in duration. No pauses. Lowest HR 45. HR today 58.  Taking Lasix dose to 20 mg as needed. No complaints of dizziness or syncope. She is very inactive and has significant arthritis in both knees. She is in a wheelchair today. Husband has to help her ambulate and move her due to mobility issues.    2. Bradycardia HR today 58. Asymptomatic.   3. Chest pain, unspecified type Chest wall pain.  Currently denies any chest pain. Continue ASA 81 mg daily.  4. Lower extremity edema At last visit she was advised compression stockings.  Continue PRN Lasix 20 mg Weight is unchanged since last visit. Advised compression stocking at last visit.  Likely has an element of venous iinsufficiency.  She is very sedentary d/t mobility issue 2/2 knee arthritis  5. Smoker She continues to smoke but states she is cut way down on the amount of cigarettes she is smoking.  Advised cessation if possible.   Medication Adjustments/Labs and Tests Ordered: Current medicines are reviewed at length with the patient today.  Concerns regarding medicines are outlined above.   Disposition: Follow-up with Rosaria Ferries per patient's request.  Patient states she would like to follow-up with [facility] for her issues.  She states Dr. Debara Pickett is her primary cardiologist  Signed, Levell July, NP 06/17/2020 11:21 AM    [facility], [place], [place] [address] Phone: [phone]; Fax: [phone] 

## 2020-06-17 ENCOUNTER — Ambulatory Visit (INDEPENDENT_AMBULATORY_CARE_PROVIDER_SITE_OTHER): Payer: Medicare Other | Admitting: Family Medicine

## 2020-06-17 ENCOUNTER — Encounter: Payer: Self-pay | Admitting: Family Medicine

## 2020-06-17 VITALS — BP 114/78 | HR 58 | Ht 61.0 in | Wt 160.0 lb

## 2020-06-17 DIAGNOSIS — R001 Bradycardia, unspecified: Secondary | ICD-10-CM

## 2020-06-17 DIAGNOSIS — R55 Syncope and collapse: Secondary | ICD-10-CM

## 2020-06-17 DIAGNOSIS — F17200 Nicotine dependence, unspecified, uncomplicated: Secondary | ICD-10-CM

## 2020-06-17 DIAGNOSIS — R600 Localized edema: Secondary | ICD-10-CM | POA: Diagnosis not present

## 2020-06-17 DIAGNOSIS — R079 Chest pain, unspecified: Secondary | ICD-10-CM | POA: Diagnosis not present

## 2020-06-17 DIAGNOSIS — R42 Dizziness and giddiness: Secondary | ICD-10-CM | POA: Diagnosis not present

## 2020-06-17 NOTE — Patient Instructions (Signed)
Medication Instructions:  Continue all current medications.   Labwork: none  Testing/Procedures: none  Follow-Up: 6 months   Any Other Special Instructions Will Be Listed Below (If Applicable).   If you need a refill on your cardiac medications before your next appointment, please call your pharmacy.  

## 2020-07-21 DIAGNOSIS — E785 Hyperlipidemia, unspecified: Secondary | ICD-10-CM | POA: Diagnosis not present

## 2020-07-27 DIAGNOSIS — I700 Atherosclerosis of aorta: Secondary | ICD-10-CM | POA: Diagnosis not present

## 2020-07-27 DIAGNOSIS — M8580 Other specified disorders of bone density and structure, unspecified site: Secondary | ICD-10-CM | POA: Diagnosis not present

## 2020-07-27 DIAGNOSIS — R918 Other nonspecific abnormal finding of lung field: Secondary | ICD-10-CM | POA: Diagnosis not present

## 2020-07-27 DIAGNOSIS — Z0001 Encounter for general adult medical examination with abnormal findings: Secondary | ICD-10-CM | POA: Diagnosis not present

## 2020-07-27 DIAGNOSIS — N1832 Chronic kidney disease, stage 3b: Secondary | ICD-10-CM | POA: Diagnosis not present

## 2020-07-27 DIAGNOSIS — I2510 Atherosclerotic heart disease of native coronary artery without angina pectoris: Secondary | ICD-10-CM | POA: Diagnosis not present

## 2020-07-27 DIAGNOSIS — E032 Hypothyroidism due to medicaments and other exogenous substances: Secondary | ICD-10-CM | POA: Diagnosis not present

## 2020-07-27 DIAGNOSIS — I471 Supraventricular tachycardia: Secondary | ICD-10-CM | POA: Diagnosis not present

## 2020-07-27 DIAGNOSIS — E785 Hyperlipidemia, unspecified: Secondary | ICD-10-CM | POA: Diagnosis not present

## 2020-07-27 DIAGNOSIS — N3281 Overactive bladder: Secondary | ICD-10-CM | POA: Diagnosis not present

## 2020-07-27 DIAGNOSIS — K219 Gastro-esophageal reflux disease without esophagitis: Secondary | ICD-10-CM | POA: Diagnosis not present

## 2020-07-27 DIAGNOSIS — F339 Major depressive disorder, recurrent, unspecified: Secondary | ICD-10-CM | POA: Diagnosis not present

## 2020-08-23 ENCOUNTER — Other Ambulatory Visit: Payer: Self-pay | Admitting: *Deleted

## 2020-08-23 DIAGNOSIS — Z87891 Personal history of nicotine dependence: Secondary | ICD-10-CM

## 2020-08-23 DIAGNOSIS — F17210 Nicotine dependence, cigarettes, uncomplicated: Secondary | ICD-10-CM

## 2020-08-25 DIAGNOSIS — N3281 Overactive bladder: Secondary | ICD-10-CM | POA: Diagnosis not present

## 2020-08-25 DIAGNOSIS — M8589 Other specified disorders of bone density and structure, multiple sites: Secondary | ICD-10-CM | POA: Diagnosis not present

## 2020-08-25 DIAGNOSIS — M810 Age-related osteoporosis without current pathological fracture: Secondary | ICD-10-CM | POA: Diagnosis not present

## 2020-08-31 ENCOUNTER — Other Ambulatory Visit: Payer: Self-pay | Admitting: Internal Medicine

## 2020-09-07 ENCOUNTER — Ambulatory Visit: Payer: Medicare Other

## 2020-09-08 DIAGNOSIS — M810 Age-related osteoporosis without current pathological fracture: Secondary | ICD-10-CM | POA: Diagnosis not present

## 2020-09-15 ENCOUNTER — Ambulatory Visit: Payer: Medicare Other

## 2020-10-02 DIAGNOSIS — T1490XA Injury, unspecified, initial encounter: Secondary | ICD-10-CM | POA: Diagnosis not present

## 2020-10-03 DIAGNOSIS — R109 Unspecified abdominal pain: Secondary | ICD-10-CM | POA: Diagnosis not present

## 2020-10-03 DIAGNOSIS — E039 Hypothyroidism, unspecified: Secondary | ICD-10-CM | POA: Diagnosis not present

## 2020-10-03 DIAGNOSIS — E785 Hyperlipidemia, unspecified: Secondary | ICD-10-CM | POA: Diagnosis not present

## 2020-10-03 DIAGNOSIS — K5900 Constipation, unspecified: Secondary | ICD-10-CM | POA: Diagnosis not present

## 2020-10-03 DIAGNOSIS — R001 Bradycardia, unspecified: Secondary | ICD-10-CM | POA: Diagnosis not present

## 2020-10-03 DIAGNOSIS — F0390 Unspecified dementia without behavioral disturbance: Secondary | ICD-10-CM | POA: Diagnosis not present

## 2020-10-03 DIAGNOSIS — F32A Depression, unspecified: Secondary | ICD-10-CM | POA: Diagnosis not present

## 2020-10-03 DIAGNOSIS — K219 Gastro-esophageal reflux disease without esophagitis: Secondary | ICD-10-CM | POA: Diagnosis not present

## 2020-10-03 DIAGNOSIS — I959 Hypotension, unspecified: Secondary | ICD-10-CM | POA: Diagnosis not present

## 2020-10-03 DIAGNOSIS — Z882 Allergy status to sulfonamides status: Secondary | ICD-10-CM | POA: Diagnosis not present

## 2020-10-31 ENCOUNTER — Other Ambulatory Visit: Payer: Self-pay | Admitting: Family Medicine

## 2020-11-30 ENCOUNTER — Other Ambulatory Visit: Payer: Self-pay | Admitting: Internal Medicine

## 2021-01-01 ENCOUNTER — Other Ambulatory Visit: Payer: Self-pay | Admitting: Family Medicine

## 2021-02-09 DIAGNOSIS — Z20828 Contact with and (suspected) exposure to other viral communicable diseases: Secondary | ICD-10-CM | POA: Diagnosis not present

## 2021-03-01 ENCOUNTER — Other Ambulatory Visit: Payer: Self-pay | Admitting: Family Medicine

## 2021-03-06 DIAGNOSIS — F339 Major depressive disorder, recurrent, unspecified: Secondary | ICD-10-CM | POA: Diagnosis not present

## 2021-03-06 DIAGNOSIS — I2510 Atherosclerotic heart disease of native coronary artery without angina pectoris: Secondary | ICD-10-CM | POA: Diagnosis not present

## 2021-03-06 DIAGNOSIS — J449 Chronic obstructive pulmonary disease, unspecified: Secondary | ICD-10-CM | POA: Diagnosis not present

## 2021-03-06 DIAGNOSIS — E785 Hyperlipidemia, unspecified: Secondary | ICD-10-CM | POA: Diagnosis not present

## 2021-03-13 DIAGNOSIS — Z20828 Contact with and (suspected) exposure to other viral communicable diseases: Secondary | ICD-10-CM | POA: Diagnosis not present

## 2021-03-13 DIAGNOSIS — E785 Hyperlipidemia, unspecified: Secondary | ICD-10-CM | POA: Diagnosis not present

## 2021-03-14 DIAGNOSIS — E785 Hyperlipidemia, unspecified: Secondary | ICD-10-CM | POA: Diagnosis not present

## 2021-03-14 DIAGNOSIS — M810 Age-related osteoporosis without current pathological fracture: Secondary | ICD-10-CM | POA: Diagnosis not present

## 2021-04-05 DIAGNOSIS — J449 Chronic obstructive pulmonary disease, unspecified: Secondary | ICD-10-CM | POA: Diagnosis not present

## 2021-04-05 DIAGNOSIS — F339 Major depressive disorder, recurrent, unspecified: Secondary | ICD-10-CM | POA: Diagnosis not present

## 2021-04-05 DIAGNOSIS — I2510 Atherosclerotic heart disease of native coronary artery without angina pectoris: Secondary | ICD-10-CM | POA: Diagnosis not present

## 2021-04-05 DIAGNOSIS — E785 Hyperlipidemia, unspecified: Secondary | ICD-10-CM | POA: Diagnosis not present

## 2021-04-13 DIAGNOSIS — Z20828 Contact with and (suspected) exposure to other viral communicable diseases: Secondary | ICD-10-CM | POA: Diagnosis not present

## 2021-04-25 ENCOUNTER — Other Ambulatory Visit: Payer: Self-pay

## 2021-04-25 DIAGNOSIS — M47816 Spondylosis without myelopathy or radiculopathy, lumbar region: Secondary | ICD-10-CM | POA: Diagnosis not present

## 2021-04-25 DIAGNOSIS — L03115 Cellulitis of right lower limb: Secondary | ICD-10-CM | POA: Diagnosis present

## 2021-04-25 DIAGNOSIS — F419 Anxiety disorder, unspecified: Secondary | ICD-10-CM | POA: Diagnosis not present

## 2021-04-25 DIAGNOSIS — R569 Unspecified convulsions: Secondary | ICD-10-CM | POA: Diagnosis not present

## 2021-04-25 DIAGNOSIS — K449 Diaphragmatic hernia without obstruction or gangrene: Secondary | ICD-10-CM | POA: Diagnosis present

## 2021-04-25 DIAGNOSIS — J439 Emphysema, unspecified: Secondary | ICD-10-CM | POA: Diagnosis present

## 2021-04-25 DIAGNOSIS — Z7901 Long term (current) use of anticoagulants: Secondary | ICD-10-CM | POA: Diagnosis not present

## 2021-04-25 DIAGNOSIS — N189 Chronic kidney disease, unspecified: Secondary | ICD-10-CM | POA: Diagnosis not present

## 2021-04-25 DIAGNOSIS — E860 Dehydration: Secondary | ICD-10-CM | POA: Diagnosis present

## 2021-04-25 DIAGNOSIS — F0280 Dementia in other diseases classified elsewhere without behavioral disturbance: Secondary | ICD-10-CM | POA: Diagnosis present

## 2021-04-25 DIAGNOSIS — M160 Bilateral primary osteoarthritis of hip: Secondary | ICD-10-CM | POA: Diagnosis not present

## 2021-04-25 DIAGNOSIS — R109 Unspecified abdominal pain: Secondary | ICD-10-CM | POA: Diagnosis not present

## 2021-04-25 DIAGNOSIS — R531 Weakness: Secondary | ICD-10-CM | POA: Diagnosis not present

## 2021-04-25 DIAGNOSIS — R069 Unspecified abnormalities of breathing: Secondary | ICD-10-CM | POA: Diagnosis not present

## 2021-04-25 DIAGNOSIS — Z7982 Long term (current) use of aspirin: Secondary | ICD-10-CM | POA: Diagnosis not present

## 2021-04-25 DIAGNOSIS — R0902 Hypoxemia: Secondary | ICD-10-CM | POA: Diagnosis not present

## 2021-04-25 DIAGNOSIS — R102 Pelvic and perineal pain: Secondary | ICD-10-CM | POA: Diagnosis not present

## 2021-04-25 DIAGNOSIS — R600 Localized edema: Secondary | ICD-10-CM | POA: Diagnosis not present

## 2021-04-25 DIAGNOSIS — Z79890 Hormone replacement therapy (postmenopausal): Secondary | ICD-10-CM | POA: Diagnosis not present

## 2021-04-25 DIAGNOSIS — S7001XA Contusion of right hip, initial encounter: Secondary | ICD-10-CM | POA: Diagnosis present

## 2021-04-25 DIAGNOSIS — R4182 Altered mental status, unspecified: Secondary | ICD-10-CM | POA: Diagnosis not present

## 2021-04-25 DIAGNOSIS — E876 Hypokalemia: Secondary | ICD-10-CM | POA: Diagnosis present

## 2021-04-25 DIAGNOSIS — Z7401 Bed confinement status: Secondary | ICD-10-CM | POA: Diagnosis not present

## 2021-04-25 DIAGNOSIS — R404 Transient alteration of awareness: Secondary | ICD-10-CM | POA: Diagnosis not present

## 2021-04-25 DIAGNOSIS — R279 Unspecified lack of coordination: Secondary | ICD-10-CM | POA: Diagnosis not present

## 2021-04-25 DIAGNOSIS — R251 Tremor, unspecified: Secondary | ICD-10-CM | POA: Diagnosis present

## 2021-04-25 DIAGNOSIS — E039 Hypothyroidism, unspecified: Secondary | ICD-10-CM | POA: Diagnosis present

## 2021-04-25 DIAGNOSIS — R911 Solitary pulmonary nodule: Secondary | ICD-10-CM | POA: Diagnosis present

## 2021-04-25 DIAGNOSIS — Z20822 Contact with and (suspected) exposure to covid-19: Secondary | ICD-10-CM | POA: Diagnosis present

## 2021-04-25 DIAGNOSIS — G309 Alzheimer's disease, unspecified: Secondary | ICD-10-CM | POA: Diagnosis present

## 2021-04-25 DIAGNOSIS — Z720 Tobacco use: Secondary | ICD-10-CM | POA: Diagnosis not present

## 2021-04-25 DIAGNOSIS — K429 Umbilical hernia without obstruction or gangrene: Secondary | ICD-10-CM | POA: Diagnosis not present

## 2021-04-25 DIAGNOSIS — E869 Volume depletion, unspecified: Secondary | ICD-10-CM | POA: Diagnosis not present

## 2021-04-25 DIAGNOSIS — E871 Hypo-osmolality and hyponatremia: Secondary | ICD-10-CM | POA: Diagnosis present

## 2021-04-25 DIAGNOSIS — F17210 Nicotine dependence, cigarettes, uncomplicated: Secondary | ICD-10-CM | POA: Diagnosis present

## 2021-04-25 DIAGNOSIS — G9341 Metabolic encephalopathy: Secondary | ICD-10-CM | POA: Diagnosis present

## 2021-04-25 DIAGNOSIS — R410 Disorientation, unspecified: Secondary | ICD-10-CM | POA: Diagnosis not present

## 2021-04-25 DIAGNOSIS — N281 Cyst of kidney, acquired: Secondary | ICD-10-CM | POA: Diagnosis not present

## 2021-04-25 DIAGNOSIS — Z79899 Other long term (current) drug therapy: Secondary | ICD-10-CM | POA: Diagnosis not present

## 2021-04-25 DIAGNOSIS — M25552 Pain in left hip: Secondary | ICD-10-CM | POA: Diagnosis present

## 2021-04-25 MED ORDER — FUROSEMIDE 20 MG PO TABS: ORAL_TABLET | ORAL | 1 refills | Status: DC

## 2021-04-26 DIAGNOSIS — L03115 Cellulitis of right lower limb: Secondary | ICD-10-CM | POA: Diagnosis not present

## 2021-04-26 DIAGNOSIS — R911 Solitary pulmonary nodule: Secondary | ICD-10-CM | POA: Diagnosis not present

## 2021-04-26 DIAGNOSIS — E876 Hypokalemia: Secondary | ICD-10-CM | POA: Diagnosis not present

## 2021-04-26 DIAGNOSIS — E860 Dehydration: Secondary | ICD-10-CM | POA: Diagnosis not present

## 2021-04-27 DIAGNOSIS — L03115 Cellulitis of right lower limb: Secondary | ICD-10-CM | POA: Diagnosis not present

## 2021-04-27 DIAGNOSIS — F419 Anxiety disorder, unspecified: Secondary | ICD-10-CM | POA: Diagnosis not present

## 2021-04-27 DIAGNOSIS — R911 Solitary pulmonary nodule: Secondary | ICD-10-CM | POA: Diagnosis not present

## 2021-04-27 DIAGNOSIS — E860 Dehydration: Secondary | ICD-10-CM | POA: Diagnosis not present

## 2021-04-28 DIAGNOSIS — Z20822 Contact with and (suspected) exposure to covid-19: Secondary | ICD-10-CM | POA: Diagnosis present

## 2021-04-28 DIAGNOSIS — G309 Alzheimer's disease, unspecified: Secondary | ICD-10-CM | POA: Diagnosis present

## 2021-04-28 DIAGNOSIS — E039 Hypothyroidism, unspecified: Secondary | ICD-10-CM | POA: Diagnosis present

## 2021-04-28 DIAGNOSIS — R251 Tremor, unspecified: Secondary | ICD-10-CM | POA: Diagnosis present

## 2021-04-28 DIAGNOSIS — Z79899 Other long term (current) drug therapy: Secondary | ICD-10-CM | POA: Diagnosis not present

## 2021-04-28 DIAGNOSIS — E876 Hypokalemia: Secondary | ICD-10-CM | POA: Diagnosis present

## 2021-04-28 DIAGNOSIS — R279 Unspecified lack of coordination: Secondary | ICD-10-CM | POA: Diagnosis not present

## 2021-04-28 DIAGNOSIS — M25552 Pain in left hip: Secondary | ICD-10-CM | POA: Diagnosis present

## 2021-04-28 DIAGNOSIS — Z7401 Bed confinement status: Secondary | ICD-10-CM | POA: Diagnosis not present

## 2021-04-28 DIAGNOSIS — G9341 Metabolic encephalopathy: Secondary | ICD-10-CM | POA: Diagnosis present

## 2021-04-28 DIAGNOSIS — Z7982 Long term (current) use of aspirin: Secondary | ICD-10-CM | POA: Diagnosis not present

## 2021-04-28 DIAGNOSIS — Z720 Tobacco use: Secondary | ICD-10-CM | POA: Diagnosis not present

## 2021-04-28 DIAGNOSIS — N189 Chronic kidney disease, unspecified: Secondary | ICD-10-CM | POA: Diagnosis present

## 2021-04-28 DIAGNOSIS — S7001XA Contusion of right hip, initial encounter: Secondary | ICD-10-CM | POA: Diagnosis present

## 2021-04-28 DIAGNOSIS — Z79890 Hormone replacement therapy (postmenopausal): Secondary | ICD-10-CM | POA: Diagnosis not present

## 2021-04-28 DIAGNOSIS — J439 Emphysema, unspecified: Secondary | ICD-10-CM | POA: Diagnosis present

## 2021-04-28 DIAGNOSIS — R404 Transient alteration of awareness: Secondary | ICD-10-CM | POA: Diagnosis not present

## 2021-04-28 DIAGNOSIS — R911 Solitary pulmonary nodule: Secondary | ICD-10-CM | POA: Diagnosis present

## 2021-04-28 DIAGNOSIS — Z7901 Long term (current) use of anticoagulants: Secondary | ICD-10-CM | POA: Diagnosis not present

## 2021-04-28 DIAGNOSIS — R600 Localized edema: Secondary | ICD-10-CM | POA: Diagnosis not present

## 2021-04-28 DIAGNOSIS — L03115 Cellulitis of right lower limb: Secondary | ICD-10-CM | POA: Diagnosis present

## 2021-04-28 DIAGNOSIS — K449 Diaphragmatic hernia without obstruction or gangrene: Secondary | ICD-10-CM | POA: Diagnosis present

## 2021-04-28 DIAGNOSIS — E860 Dehydration: Secondary | ICD-10-CM | POA: Diagnosis present

## 2021-04-28 DIAGNOSIS — F17210 Nicotine dependence, cigarettes, uncomplicated: Secondary | ICD-10-CM | POA: Diagnosis present

## 2021-04-28 DIAGNOSIS — F0280 Dementia in other diseases classified elsewhere without behavioral disturbance: Secondary | ICD-10-CM | POA: Diagnosis present

## 2021-04-28 DIAGNOSIS — R0902 Hypoxemia: Secondary | ICD-10-CM | POA: Diagnosis not present

## 2021-04-28 DIAGNOSIS — E871 Hypo-osmolality and hyponatremia: Secondary | ICD-10-CM | POA: Diagnosis present

## 2021-04-28 DIAGNOSIS — R410 Disorientation, unspecified: Secondary | ICD-10-CM | POA: Diagnosis not present

## 2021-04-29 DIAGNOSIS — J439 Emphysema, unspecified: Secondary | ICD-10-CM | POA: Diagnosis not present

## 2021-04-29 DIAGNOSIS — M160 Bilateral primary osteoarthritis of hip: Secondary | ICD-10-CM | POA: Diagnosis not present

## 2021-04-29 DIAGNOSIS — K449 Diaphragmatic hernia without obstruction or gangrene: Secondary | ICD-10-CM | POA: Diagnosis not present

## 2021-04-29 DIAGNOSIS — W19XXXD Unspecified fall, subsequent encounter: Secondary | ICD-10-CM | POA: Diagnosis not present

## 2021-04-29 DIAGNOSIS — E870 Hyperosmolality and hypernatremia: Secondary | ICD-10-CM | POA: Diagnosis not present

## 2021-04-29 DIAGNOSIS — L03115 Cellulitis of right lower limb: Secondary | ICD-10-CM | POA: Diagnosis not present

## 2021-04-29 DIAGNOSIS — R911 Solitary pulmonary nodule: Secondary | ICD-10-CM | POA: Diagnosis not present

## 2021-04-29 DIAGNOSIS — E876 Hypokalemia: Secondary | ICD-10-CM | POA: Diagnosis not present

## 2021-04-29 DIAGNOSIS — N189 Chronic kidney disease, unspecified: Secondary | ICD-10-CM | POA: Diagnosis not present

## 2021-04-29 DIAGNOSIS — E039 Hypothyroidism, unspecified: Secondary | ICD-10-CM | POA: Diagnosis not present

## 2021-04-29 DIAGNOSIS — F17210 Nicotine dependence, cigarettes, uncomplicated: Secondary | ICD-10-CM | POA: Diagnosis not present

## 2021-04-29 DIAGNOSIS — Z9181 History of falling: Secondary | ICD-10-CM | POA: Diagnosis not present

## 2021-04-29 DIAGNOSIS — G309 Alzheimer's disease, unspecified: Secondary | ICD-10-CM | POA: Diagnosis not present

## 2021-04-29 DIAGNOSIS — F0280 Dementia in other diseases classified elsewhere without behavioral disturbance: Secondary | ICD-10-CM | POA: Diagnosis not present

## 2021-04-29 DIAGNOSIS — Z7982 Long term (current) use of aspirin: Secondary | ICD-10-CM | POA: Diagnosis not present

## 2021-05-03 DIAGNOSIS — F0280 Dementia in other diseases classified elsewhere without behavioral disturbance: Secondary | ICD-10-CM | POA: Diagnosis not present

## 2021-05-03 DIAGNOSIS — J439 Emphysema, unspecified: Secondary | ICD-10-CM | POA: Diagnosis not present

## 2021-05-03 DIAGNOSIS — G309 Alzheimer's disease, unspecified: Secondary | ICD-10-CM | POA: Diagnosis not present

## 2021-05-03 DIAGNOSIS — E039 Hypothyroidism, unspecified: Secondary | ICD-10-CM | POA: Diagnosis not present

## 2021-05-03 DIAGNOSIS — L03115 Cellulitis of right lower limb: Secondary | ICD-10-CM | POA: Diagnosis not present

## 2021-05-03 DIAGNOSIS — E870 Hyperosmolality and hypernatremia: Secondary | ICD-10-CM | POA: Diagnosis not present

## 2021-05-05 DIAGNOSIS — G309 Alzheimer's disease, unspecified: Secondary | ICD-10-CM | POA: Diagnosis not present

## 2021-05-05 DIAGNOSIS — E870 Hyperosmolality and hypernatremia: Secondary | ICD-10-CM | POA: Diagnosis not present

## 2021-05-05 DIAGNOSIS — F0280 Dementia in other diseases classified elsewhere without behavioral disturbance: Secondary | ICD-10-CM | POA: Diagnosis not present

## 2021-05-05 DIAGNOSIS — I2510 Atherosclerotic heart disease of native coronary artery without angina pectoris: Secondary | ICD-10-CM | POA: Diagnosis not present

## 2021-05-05 DIAGNOSIS — F339 Major depressive disorder, recurrent, unspecified: Secondary | ICD-10-CM | POA: Diagnosis not present

## 2021-05-05 DIAGNOSIS — J449 Chronic obstructive pulmonary disease, unspecified: Secondary | ICD-10-CM | POA: Diagnosis not present

## 2021-05-05 DIAGNOSIS — E039 Hypothyroidism, unspecified: Secondary | ICD-10-CM | POA: Diagnosis not present

## 2021-05-05 DIAGNOSIS — E785 Hyperlipidemia, unspecified: Secondary | ICD-10-CM | POA: Diagnosis not present

## 2021-05-05 DIAGNOSIS — L03115 Cellulitis of right lower limb: Secondary | ICD-10-CM | POA: Diagnosis not present

## 2021-05-05 DIAGNOSIS — J439 Emphysema, unspecified: Secondary | ICD-10-CM | POA: Diagnosis not present

## 2021-05-10 DIAGNOSIS — L03115 Cellulitis of right lower limb: Secondary | ICD-10-CM | POA: Diagnosis not present

## 2021-05-10 DIAGNOSIS — E870 Hyperosmolality and hypernatremia: Secondary | ICD-10-CM | POA: Diagnosis not present

## 2021-05-10 DIAGNOSIS — J439 Emphysema, unspecified: Secondary | ICD-10-CM | POA: Diagnosis not present

## 2021-05-10 DIAGNOSIS — E039 Hypothyroidism, unspecified: Secondary | ICD-10-CM | POA: Diagnosis not present

## 2021-05-10 DIAGNOSIS — G309 Alzheimer's disease, unspecified: Secondary | ICD-10-CM | POA: Diagnosis not present

## 2021-05-10 DIAGNOSIS — F0280 Dementia in other diseases classified elsewhere without behavioral disturbance: Secondary | ICD-10-CM | POA: Diagnosis not present

## 2021-05-11 DIAGNOSIS — L03115 Cellulitis of right lower limb: Secondary | ICD-10-CM | POA: Diagnosis not present

## 2021-05-11 DIAGNOSIS — J439 Emphysema, unspecified: Secondary | ICD-10-CM | POA: Diagnosis not present

## 2021-05-11 DIAGNOSIS — G309 Alzheimer's disease, unspecified: Secondary | ICD-10-CM | POA: Diagnosis not present

## 2021-05-11 DIAGNOSIS — F0280 Dementia in other diseases classified elsewhere without behavioral disturbance: Secondary | ICD-10-CM | POA: Diagnosis not present

## 2021-05-11 DIAGNOSIS — E039 Hypothyroidism, unspecified: Secondary | ICD-10-CM | POA: Diagnosis not present

## 2021-05-11 DIAGNOSIS — E870 Hyperosmolality and hypernatremia: Secondary | ICD-10-CM | POA: Diagnosis not present

## 2021-05-17 DIAGNOSIS — J439 Emphysema, unspecified: Secondary | ICD-10-CM | POA: Diagnosis not present

## 2021-05-17 DIAGNOSIS — G309 Alzheimer's disease, unspecified: Secondary | ICD-10-CM | POA: Diagnosis not present

## 2021-05-17 DIAGNOSIS — E870 Hyperosmolality and hypernatremia: Secondary | ICD-10-CM | POA: Diagnosis not present

## 2021-05-17 DIAGNOSIS — L03115 Cellulitis of right lower limb: Secondary | ICD-10-CM | POA: Diagnosis not present

## 2021-05-17 DIAGNOSIS — F0280 Dementia in other diseases classified elsewhere without behavioral disturbance: Secondary | ICD-10-CM | POA: Diagnosis not present

## 2021-05-17 DIAGNOSIS — E039 Hypothyroidism, unspecified: Secondary | ICD-10-CM | POA: Diagnosis not present

## 2021-05-19 ENCOUNTER — Other Ambulatory Visit: Payer: Self-pay

## 2021-05-19 ENCOUNTER — Emergency Department (HOSPITAL_COMMUNITY): Payer: Medicare Other

## 2021-05-19 ENCOUNTER — Emergency Department (HOSPITAL_COMMUNITY)
Admission: EM | Admit: 2021-05-19 | Discharge: 2021-05-19 | Disposition: A | Discharge status: Home / Self Care | Payer: Medicare Other | Attending: Emergency Medicine | Admitting: Emergency Medicine

## 2021-05-19 ENCOUNTER — Encounter (HOSPITAL_COMMUNITY): Payer: Self-pay | Admitting: *Deleted

## 2021-05-19 DIAGNOSIS — I959 Hypotension, unspecified: Secondary | ICD-10-CM | POA: Diagnosis not present

## 2021-05-19 DIAGNOSIS — Z79899 Other long term (current) drug therapy: Secondary | ICD-10-CM | POA: Insufficient documentation

## 2021-05-19 DIAGNOSIS — I509 Heart failure, unspecified: Secondary | ICD-10-CM | POA: Insufficient documentation

## 2021-05-19 DIAGNOSIS — M79604 Pain in right leg: Secondary | ICD-10-CM | POA: Diagnosis not present

## 2021-05-19 DIAGNOSIS — F0390 Unspecified dementia without behavioral disturbance: Secondary | ICD-10-CM | POA: Insufficient documentation

## 2021-05-19 DIAGNOSIS — R600 Localized edema: Secondary | ICD-10-CM | POA: Diagnosis not present

## 2021-05-19 DIAGNOSIS — R52 Pain, unspecified: Secondary | ICD-10-CM | POA: Diagnosis not present

## 2021-05-19 DIAGNOSIS — Z7982 Long term (current) use of aspirin: Secondary | ICD-10-CM | POA: Diagnosis not present

## 2021-05-19 DIAGNOSIS — C801 Malignant (primary) neoplasm, unspecified: Secondary | ICD-10-CM | POA: Diagnosis not present

## 2021-05-19 DIAGNOSIS — Z743 Need for continuous supervision: Secondary | ICD-10-CM | POA: Diagnosis not present

## 2021-05-19 DIAGNOSIS — Z20828 Contact with and (suspected) exposure to other viral communicable diseases: Secondary | ICD-10-CM | POA: Diagnosis not present

## 2021-05-19 DIAGNOSIS — Z7401 Bed confinement status: Secondary | ICD-10-CM | POA: Diagnosis not present

## 2021-05-19 DIAGNOSIS — R5381 Other malaise: Secondary | ICD-10-CM | POA: Diagnosis not present

## 2021-05-19 DIAGNOSIS — R279 Unspecified lack of coordination: Secondary | ICD-10-CM | POA: Diagnosis not present

## 2021-05-19 HISTORY — DX: Dementia in other diseases classified elsewhere, unspecified severity, without behavioral disturbance, psychotic disturbance, mood disturbance, and anxiety: F02.80

## 2021-05-19 NOTE — ED Triage Notes (Signed)
Pt brought in by [facility] from home with c/o right leg pain and possible blood clot in that leg. Pt was admitted to [facility] in December and was called today by the nurse from her PCP office telling her to come to the ED for reevaluation.

## 2021-05-19 NOTE — ED Notes (Addendum)
Pt spouse stated that he would not be driving pt home, that he would like EMS to transport pt back to residence. Explained that wait time for an available EMS could take hours. Spouse expressed frustration with this information. Secretary has called EMS for transport.  Pt continues to refuse vital signs.

## 2021-05-19 NOTE — ED Notes (Signed)
Pt refused to allow this nurse to update vital signs.

## 2021-05-19 NOTE — ED Notes (Signed)
Patient transported to Ultrasound 

## 2021-05-19 NOTE — Discharge Instructions (Signed)
Keep your legs elevated follow-up with your family doctor

## 2021-05-19 NOTE — ED Notes (Signed)
Pt was not cooperating when asked if she would like to sit on the bed, pt became irate and yelled "I just want to go home, I do not want to be here". Person who brought pt here stated that she has dementia and that this behavior is normal for her. Pt taken to another room so that she would not have to get in and out of her wheelchair for scans. Call bell given to friend and suggested the pt watch television to calm down, due to pt becoming tearful and trying to knock items off of the counter.

## 2021-05-19 NOTE — ED Provider Notes (Signed)
[facility] EMERGENCY DEPARTMENT Provider Note   CSN: [id number] Arrival date & time: 05/19/21  7782     History  Chief Complaint  Patient presents with   Leg Pain    Victoria Sims is a 81 y.o. female.  Patient has a history of dementia and heart failure.  According to her husband the fluid in her legs seem to be getting better since she left the hospital.  She was sent over here by her primary care doctor for evaluation for DVT.  The history is provided by a relative. No language interpreter was used.  Leg Pain Location:  Leg Injury: no   Leg location:  L leg and R lower leg Pain details:    Quality:  Aching   Severity:  Moderate   Onset quality:  Sudden   Timing:  Constant   Progression:  Worsening Chronicity:  Recurrent Dislocation: no   Foreign body present:  No foreign bodies Relieved by:  Nothing Associated symptoms: no back pain and no fatigue       Home Medications Prior to Admission medications   Medication Sig Start Date End Date Taking? Authorizing Provider  aspirin 81 MG EC tablet Take 81 mg by mouth daily. 10/06/15   [provider]  Calcium-Vitamin D (CALTRATE 600 PLUS-VIT D PO) Take 1 tablet by mouth daily.    [provider]  diclofenac Sodium (VOLTAREN) 1 % GEL Apply 4 g topically in the morning, at noon, and at bedtime. 02/10/20   Barrett, Evelene Croon, PA-C  escitalopram (LEXAPRO) 20 MG tablet Take 40 mg by mouth daily.    [provider]  ezetimibe (ZETIA) 10 MG tablet TAKE 1 TABLET BY MOUTH DAILY 11/30/20   Hilty, Nadean Corwin, MD  furosemide (LASIX) 20 MG tablet TAKE 1 TABLET BY MOUTH EVERY DAY AS NEEDED FOR SWELLING 04/25/21   Hilty, Nadean Corwin, MD  levothyroxine (SYNTHROID, LEVOTHROID) 88 MCG tablet Take 88 mcg by mouth daily. 01/30/18   [provider]  LORazepam (ATIVAN) 1 MG tablet Take 1-2 tablets 30 minutes prior to MRI, may repeat once as needed. Must have driver. 12/10/19   Marcial Pacas, MD  pantoprazole (PROTONIX)  40 MG tablet Take by mouth.    [provider]  rosuvastatin (CRESTOR) 20 MG tablet TAKE 1 TABLET BY MOUTH EVERY DAY 07/07/19   Hilty, Nadean Corwin, MD      Allergies    Atorvastatin, Gabapentin, Macrobid [nitrofurantoin macrocrystal], and Sulfa antibiotics    Review of Systems   Review of Systems  Constitutional:  Negative for appetite change and fatigue.  HENT:  Negative for congestion, ear discharge and sinus pressure.   Eyes:  Negative for discharge.  Respiratory:  Negative for cough.   Cardiovascular:  Negative for chest pain.  Gastrointestinal:  Negative for abdominal pain and diarrhea.  Genitourinary:  Negative for frequency and hematuria.  Musculoskeletal:  Negative for back pain.       Leg swelling  Skin:  Negative for rash.  Neurological:  Negative for seizures and headaches.  Psychiatric/Behavioral:  Negative for hallucinations.    Physical Exam Updated Vital Signs BP (!) 149/80 (BP Location: Right Arm)    Pulse 64    Temp 98.3 F (36.8 C) (Oral)    Resp 14    Ht 5\' 2"  (1.575 m)    Wt 90.3 kg    SpO2 96%    BMI 36.40 kg/m  Physical Exam Vitals and nursing note reviewed.  Constitutional:  Appearance: She is well-developed.  HENT:     Head: Normocephalic.     Nose: Nose normal.  Eyes:     General: No scleral icterus.    Conjunctiva/sclera: Conjunctivae normal.  Neck:     Thyroid: No thyromegaly.  Cardiovascular:     Rate and Rhythm: Normal rate and regular rhythm.     Heart sounds: No murmur heard.   No friction rub. No gallop.  Pulmonary:     Breath sounds: No stridor. No wheezing or rales.  Chest:     Chest wall: No tenderness.  Abdominal:     General: There is no distension.     Tenderness: There is no abdominal tenderness. There is no rebound.  Musculoskeletal:        General: Normal range of motion.     Cervical back: Neck supple.     Comments: 2+ edema in both legs  Lymphadenopathy:     Cervical: No cervical adenopathy.  Skin:     Findings: No erythema or rash.  Neurological:     Mental Status: She is alert and oriented to person, place, and time.     Motor: No abnormal muscle tone.     Coordination: Coordination normal.  Psychiatric:        Behavior: Behavior normal.    ED Results / Procedures / Treatments   Labs (all labs ordered are listed, but only abnormal results are displayed) Labs Reviewed - No data to display  EKG None  Radiology US Venous Img Lower Unilateral Right  Result Date: 05/19/2021 CLINICAL DATA:  DVT Pain Malignancy EXAM: RIGHT LOWER EXTREMITY VENOUS DOPPLER ULTRASOUND TECHNIQUE: Gray-scale sonography with graded compression, as well as color Doppler and duplex ultrasound were performed to evaluate the lower extremity deep venous systems from the level of the common femoral vein and including the common femoral, femoral, profunda femoral, popliteal and calf veins including the posterior tibial, peroneal and gastrocnemius veins when visible. The superficial great saphenous vein was also interrogated. Spectral Doppler was utilized to evaluate flow at rest and with distal augmentation maneuvers in the common femoral, femoral and popliteal veins. COMPARISON:  None. FINDINGS: Contralateral Common Femoral Vein: Respiratory phasicity is normal and symmetric with the symptomatic side. No evidence of thrombus. Normal compressibility. Common Femoral Vein: No evidence of thrombus. Normal compressibility, respiratory phasicity and response to augmentation. Saphenofemoral Junction: No evidence of thrombus. Normal compressibility and flow on color Doppler imaging. Profunda Femoral Vein: No evidence of thrombus. Normal compressibility and flow on color Doppler imaging. Femoral Vein: No evidence of thrombus. Normal compressibility, respiratory phasicity and response to augmentation. Popliteal Vein: Unable to evaluate Calf Veins: Unable to evaluate Other Findings:  None. IMPRESSION: No above knee right lower extremity  DVT. Patient became combative and study had to be terminated early. Unable to evaluate popliteal and calf veins. Electronically Signed   By: Miachel Roux M.D.   On: 05/19/2021 12:06    Procedures Procedures    Medications Ordered in ED Medications - No data to display  ED Course/ Medical Decision Making/ A&P Patient with swelling to both lower legs.  Patient would not cooperate to get an ultrasound done to rule out DVT in her right lower leg.  They are able to see above the knee that was negative.  I think there is a low chance that she has a DVT and we will discharge back home to follow-up with her doctor both            This  patient presents to the ED for concern of swelling in leg, this involves an extensive number of treatment options, and is a complaint that carries with it a high risk of complications and morbidity.  The differential diagnosis includes peripheral edema from heart failure, DVT   Co morbidities that complicate the patient evaluation  Dementia and heart failure   Additional history obtained:  Additional history obtained from husband External records from outside source obtained and reviewed including old records   Lab Tests:  No labs  Imaging Studies ordered:  I ordered imaging studies including ultrasound of legs to rule out DVT I independently visualized and interpreted imaging which showed negative ultrasound but inadequate study because of patient not allowing it to be done I agree with the radiologist interpretation   Cardiac Monitoring:  None  Medicines ordered and prescription drug management:  No medicine Reevaluation of the patient after these medicines showed that the patient stayed the same I have reviewed the patients home medicines and have made adjustments as needed   Test Considered:  Repeat DVT but family would not allow it   Critical Interventions:  None   Consultations Obtained: None  Problem List / ED  Course:  Chronic heart failure and swelling in legs  Reevaluation:  After the interventions noted above, I reevaluated the patient and found that they have :stayed the same   Social Determinants of Health:  Dementia   Dispostion:  After consideration of the diagnostic results and the patients response to treatment, I feel that the patent would benefit from discharge home with follow-up with PCP.                 Medical Decision Making  Bilateral edema in lower legs        Final Clinical Impression(s) / ED Diagnoses Final diagnoses:  Leg edema, right    Rx / DC Orders ED Discharge Orders     None         Milton Ferguson, MD 05/20/21 1031

## 2021-05-20 DIAGNOSIS — G309 Alzheimer's disease, unspecified: Secondary | ICD-10-CM | POA: Diagnosis not present

## 2021-05-20 DIAGNOSIS — L03115 Cellulitis of right lower limb: Secondary | ICD-10-CM | POA: Diagnosis not present

## 2021-05-20 DIAGNOSIS — E039 Hypothyroidism, unspecified: Secondary | ICD-10-CM | POA: Diagnosis not present

## 2021-05-20 DIAGNOSIS — F0280 Dementia in other diseases classified elsewhere without behavioral disturbance: Secondary | ICD-10-CM | POA: Diagnosis not present

## 2021-05-20 DIAGNOSIS — J439 Emphysema, unspecified: Secondary | ICD-10-CM | POA: Diagnosis not present

## 2021-05-20 DIAGNOSIS — E870 Hyperosmolality and hypernatremia: Secondary | ICD-10-CM | POA: Diagnosis not present

## 2021-05-22 DIAGNOSIS — F0280 Dementia in other diseases classified elsewhere without behavioral disturbance: Secondary | ICD-10-CM | POA: Diagnosis not present

## 2021-05-22 DIAGNOSIS — E039 Hypothyroidism, unspecified: Secondary | ICD-10-CM | POA: Diagnosis not present

## 2021-05-22 DIAGNOSIS — J439 Emphysema, unspecified: Secondary | ICD-10-CM | POA: Diagnosis not present

## 2021-05-22 DIAGNOSIS — L03115 Cellulitis of right lower limb: Secondary | ICD-10-CM | POA: Diagnosis not present

## 2021-05-22 DIAGNOSIS — E870 Hyperosmolality and hypernatremia: Secondary | ICD-10-CM | POA: Diagnosis not present

## 2021-05-22 DIAGNOSIS — G309 Alzheimer's disease, unspecified: Secondary | ICD-10-CM | POA: Diagnosis not present

## 2021-05-23 DIAGNOSIS — J439 Emphysema, unspecified: Secondary | ICD-10-CM | POA: Diagnosis not present

## 2021-05-23 DIAGNOSIS — G309 Alzheimer's disease, unspecified: Secondary | ICD-10-CM | POA: Diagnosis not present

## 2021-05-23 DIAGNOSIS — L03115 Cellulitis of right lower limb: Secondary | ICD-10-CM | POA: Diagnosis not present

## 2021-05-23 DIAGNOSIS — E039 Hypothyroidism, unspecified: Secondary | ICD-10-CM | POA: Diagnosis not present

## 2021-05-23 DIAGNOSIS — E870 Hyperosmolality and hypernatremia: Secondary | ICD-10-CM | POA: Diagnosis not present

## 2021-05-23 DIAGNOSIS — F0280 Dementia in other diseases classified elsewhere without behavioral disturbance: Secondary | ICD-10-CM | POA: Diagnosis not present

## 2021-05-24 DIAGNOSIS — L03115 Cellulitis of right lower limb: Secondary | ICD-10-CM | POA: Diagnosis not present

## 2021-05-24 DIAGNOSIS — E870 Hyperosmolality and hypernatremia: Secondary | ICD-10-CM | POA: Diagnosis not present

## 2021-05-24 DIAGNOSIS — G309 Alzheimer's disease, unspecified: Secondary | ICD-10-CM | POA: Diagnosis not present

## 2021-05-24 DIAGNOSIS — F0280 Dementia in other diseases classified elsewhere without behavioral disturbance: Secondary | ICD-10-CM | POA: Diagnosis not present

## 2021-05-24 DIAGNOSIS — E039 Hypothyroidism, unspecified: Secondary | ICD-10-CM | POA: Diagnosis not present

## 2021-05-24 DIAGNOSIS — J439 Emphysema, unspecified: Secondary | ICD-10-CM | POA: Diagnosis not present

## 2021-05-26 DIAGNOSIS — R600 Localized edema: Secondary | ICD-10-CM | POA: Diagnosis not present

## 2021-05-26 DIAGNOSIS — E876 Hypokalemia: Secondary | ICD-10-CM | POA: Diagnosis not present

## 2021-05-26 DIAGNOSIS — E785 Hyperlipidemia, unspecified: Secondary | ICD-10-CM | POA: Diagnosis not present

## 2021-05-26 DIAGNOSIS — J439 Emphysema, unspecified: Secondary | ICD-10-CM | POA: Diagnosis not present

## 2021-05-26 DIAGNOSIS — I872 Venous insufficiency (chronic) (peripheral): Secondary | ICD-10-CM | POA: Diagnosis not present

## 2021-05-26 DIAGNOSIS — E039 Hypothyroidism, unspecified: Secondary | ICD-10-CM | POA: Diagnosis not present

## 2021-05-26 DIAGNOSIS — L03115 Cellulitis of right lower limb: Secondary | ICD-10-CM | POA: Diagnosis not present

## 2021-05-26 DIAGNOSIS — F03B3 Unspecified dementia, moderate, with mood disturbance: Secondary | ICD-10-CM | POA: Diagnosis not present

## 2021-05-26 DIAGNOSIS — E870 Hyperosmolality and hypernatremia: Secondary | ICD-10-CM | POA: Diagnosis not present

## 2021-05-26 DIAGNOSIS — Z09 Encounter for follow-up examination after completed treatment for conditions other than malignant neoplasm: Secondary | ICD-10-CM | POA: Diagnosis not present

## 2021-05-26 DIAGNOSIS — G309 Alzheimer's disease, unspecified: Secondary | ICD-10-CM | POA: Diagnosis not present

## 2021-05-26 DIAGNOSIS — F0280 Dementia in other diseases classified elsewhere without behavioral disturbance: Secondary | ICD-10-CM | POA: Diagnosis not present

## 2021-05-29 DIAGNOSIS — E039 Hypothyroidism, unspecified: Secondary | ICD-10-CM | POA: Diagnosis not present

## 2021-05-29 DIAGNOSIS — K449 Diaphragmatic hernia without obstruction or gangrene: Secondary | ICD-10-CM | POA: Diagnosis not present

## 2021-05-29 DIAGNOSIS — E870 Hyperosmolality and hypernatremia: Secondary | ICD-10-CM | POA: Diagnosis not present

## 2021-05-29 DIAGNOSIS — J439 Emphysema, unspecified: Secondary | ICD-10-CM | POA: Diagnosis not present

## 2021-05-29 DIAGNOSIS — Z9181 History of falling: Secondary | ICD-10-CM | POA: Diagnosis not present

## 2021-05-29 DIAGNOSIS — L03115 Cellulitis of right lower limb: Secondary | ICD-10-CM | POA: Diagnosis not present

## 2021-05-29 DIAGNOSIS — R911 Solitary pulmonary nodule: Secondary | ICD-10-CM | POA: Diagnosis not present

## 2021-05-29 DIAGNOSIS — N189 Chronic kidney disease, unspecified: Secondary | ICD-10-CM | POA: Diagnosis not present

## 2021-05-29 DIAGNOSIS — F17210 Nicotine dependence, cigarettes, uncomplicated: Secondary | ICD-10-CM | POA: Diagnosis not present

## 2021-05-29 DIAGNOSIS — G309 Alzheimer's disease, unspecified: Secondary | ICD-10-CM | POA: Diagnosis not present

## 2021-05-29 DIAGNOSIS — Z7982 Long term (current) use of aspirin: Secondary | ICD-10-CM | POA: Diagnosis not present

## 2021-05-29 DIAGNOSIS — M160 Bilateral primary osteoarthritis of hip: Secondary | ICD-10-CM | POA: Diagnosis not present

## 2021-05-29 DIAGNOSIS — F0280 Dementia in other diseases classified elsewhere without behavioral disturbance: Secondary | ICD-10-CM | POA: Diagnosis not present

## 2021-05-29 DIAGNOSIS — E876 Hypokalemia: Secondary | ICD-10-CM | POA: Diagnosis not present

## 2021-05-29 DIAGNOSIS — W19XXXD Unspecified fall, subsequent encounter: Secondary | ICD-10-CM | POA: Diagnosis not present

## 2021-06-01 DIAGNOSIS — E039 Hypothyroidism, unspecified: Secondary | ICD-10-CM | POA: Diagnosis not present

## 2021-06-01 DIAGNOSIS — F0280 Dementia in other diseases classified elsewhere without behavioral disturbance: Secondary | ICD-10-CM | POA: Diagnosis not present

## 2021-06-01 DIAGNOSIS — J439 Emphysema, unspecified: Secondary | ICD-10-CM | POA: Diagnosis not present

## 2021-06-01 DIAGNOSIS — L03115 Cellulitis of right lower limb: Secondary | ICD-10-CM | POA: Diagnosis not present

## 2021-06-01 DIAGNOSIS — G309 Alzheimer's disease, unspecified: Secondary | ICD-10-CM | POA: Diagnosis not present

## 2021-06-01 DIAGNOSIS — E870 Hyperosmolality and hypernatremia: Secondary | ICD-10-CM | POA: Diagnosis not present

## 2021-06-06 ENCOUNTER — Telehealth: Payer: Self-pay

## 2021-06-06 DIAGNOSIS — E785 Hyperlipidemia, unspecified: Secondary | ICD-10-CM | POA: Diagnosis not present

## 2021-06-06 DIAGNOSIS — F339 Major depressive disorder, recurrent, unspecified: Secondary | ICD-10-CM | POA: Diagnosis not present

## 2021-06-06 DIAGNOSIS — I2510 Atherosclerotic heart disease of native coronary artery without angina pectoris: Secondary | ICD-10-CM | POA: Diagnosis not present

## 2021-06-06 DIAGNOSIS — J449 Chronic obstructive pulmonary disease, unspecified: Secondary | ICD-10-CM | POA: Diagnosis not present

## 2021-06-06 NOTE — Telephone Encounter (Signed)
Spoke with patient's sister Claiborne Rigg and scheduled a Televisit Palliative Consult for 06/09/21 @ 1 PM.   Consent obtained; updated Outlook/Netsmart/Team List and Epic.

## 2021-06-08 DIAGNOSIS — E870 Hyperosmolality and hypernatremia: Secondary | ICD-10-CM | POA: Diagnosis not present

## 2021-06-08 DIAGNOSIS — L03115 Cellulitis of right lower limb: Secondary | ICD-10-CM | POA: Diagnosis not present

## 2021-06-08 DIAGNOSIS — E039 Hypothyroidism, unspecified: Secondary | ICD-10-CM | POA: Diagnosis not present

## 2021-06-08 DIAGNOSIS — G309 Alzheimer's disease, unspecified: Secondary | ICD-10-CM | POA: Diagnosis not present

## 2021-06-08 DIAGNOSIS — J439 Emphysema, unspecified: Secondary | ICD-10-CM | POA: Diagnosis not present

## 2021-06-08 DIAGNOSIS — F0280 Dementia in other diseases classified elsewhere without behavioral disturbance: Secondary | ICD-10-CM | POA: Diagnosis not present

## 2021-06-09 ENCOUNTER — Other Ambulatory Visit: Payer: Medicare Other | Admitting: Family Medicine

## 2021-06-09 ENCOUNTER — Other Ambulatory Visit: Payer: Self-pay

## 2021-06-09 ENCOUNTER — Telehealth: Payer: Self-pay | Admitting: Family Medicine

## 2021-06-09 NOTE — Telephone Encounter (Signed)
TCT pt's spouse [name].  He states his understanding of referral to [facility] was so pt would not have to come to [facility] every time as it was hard on her in her current condition.  Advised of Palliative Service offerings and he states, "We don't need that. The only thing we were looking for was to see somebody closer to home in [place] so we don't have to go to [place] every month.  They referred [place] to Dr Kristin Bruins in [place] and she is looking over our information to see if she is going to accept her."  He is currently not interested in [place] and was made aware that if that changed he could certainly call and request she be seen.  He verbalized understanding.  Damaris Hippo FNP-C

## 2021-06-15 DIAGNOSIS — J439 Emphysema, unspecified: Secondary | ICD-10-CM | POA: Diagnosis not present

## 2021-06-15 DIAGNOSIS — E039 Hypothyroidism, unspecified: Secondary | ICD-10-CM | POA: Diagnosis not present

## 2021-06-15 DIAGNOSIS — G309 Alzheimer's disease, unspecified: Secondary | ICD-10-CM | POA: Diagnosis not present

## 2021-06-15 DIAGNOSIS — F0280 Dementia in other diseases classified elsewhere without behavioral disturbance: Secondary | ICD-10-CM | POA: Diagnosis not present

## 2021-06-15 DIAGNOSIS — L03115 Cellulitis of right lower limb: Secondary | ICD-10-CM | POA: Diagnosis not present

## 2021-06-15 DIAGNOSIS — E870 Hyperosmolality and hypernatremia: Secondary | ICD-10-CM | POA: Diagnosis not present

## 2021-06-23 DIAGNOSIS — L03115 Cellulitis of right lower limb: Secondary | ICD-10-CM | POA: Diagnosis not present

## 2021-06-23 DIAGNOSIS — G309 Alzheimer's disease, unspecified: Secondary | ICD-10-CM | POA: Diagnosis not present

## 2021-06-23 DIAGNOSIS — E870 Hyperosmolality and hypernatremia: Secondary | ICD-10-CM | POA: Diagnosis not present

## 2021-06-23 DIAGNOSIS — F0280 Dementia in other diseases classified elsewhere without behavioral disturbance: Secondary | ICD-10-CM | POA: Diagnosis not present

## 2021-06-23 DIAGNOSIS — E039 Hypothyroidism, unspecified: Secondary | ICD-10-CM | POA: Diagnosis not present

## 2021-06-23 DIAGNOSIS — J439 Emphysema, unspecified: Secondary | ICD-10-CM | POA: Diagnosis not present

## 2021-06-28 DIAGNOSIS — L299 Pruritus, unspecified: Secondary | ICD-10-CM | POA: Diagnosis not present

## 2021-06-28 DIAGNOSIS — L03115 Cellulitis of right lower limb: Secondary | ICD-10-CM | POA: Diagnosis not present

## 2021-06-28 DIAGNOSIS — R600 Localized edema: Secondary | ICD-10-CM | POA: Diagnosis not present

## 2021-07-03 ENCOUNTER — Other Ambulatory Visit: Payer: Self-pay

## 2021-07-03 ENCOUNTER — Other Ambulatory Visit: Payer: Medicare Other | Admitting: Family Medicine

## 2021-07-03 DIAGNOSIS — L03119 Cellulitis of unspecified part of limb: Secondary | ICD-10-CM | POA: Diagnosis not present

## 2021-07-03 DIAGNOSIS — Z515 Encounter for palliative care: Secondary | ICD-10-CM | POA: Diagnosis not present

## 2021-07-03 DIAGNOSIS — L02419 Cutaneous abscess of limb, unspecified: Secondary | ICD-10-CM | POA: Diagnosis not present

## 2021-07-03 NOTE — Progress Notes (Signed)
[facility] Consult Note Telephone: [phone]  Fax: [phone]    Date of encounter: 07/03/21 2:04 PM PATIENT NAME: Victoria Sims [place] [address]   [id number] (home)  DOB: [birth year] MRN: [id number] PRIMARY CARE PROVIDER:    [facility], [address],  [place] [address] [phone]  REFERRING PROVIDER:   [facility], [place] [address] [place] [place],  [place] [address] [phone]  RESPONSIBLE PARTY:    Contact Information     Name Relation Home Work Mobile   Jobos Spouse   [phone]   Matthew Folks   [phone]       I connected with  Victoria Sims and her husband Victoria Sims on 07/03/21 by telemedicine application, was unable to do video visit due to pt's inability to use the technology and verified that I am speaking with the correct person using two identifiers.   I discussed the limitations of evaluation and management by telemedicine. The patient expressed understanding and agreed to proceed.  Palliative Care was asked to follow this patient by consultation request of  Adria Dill Leonia Reader, FNP to address advance care planning and complex medical decision making. This is a follow up visit.                                   ASSESSMENT, SYMPTOM MANAGEMENT AND PLAN / RECOMMENDATIONS: ]\   Palliative Care Encounter Follow up at next in person visit in 4 weeks for code status, advance care planning and goals of care.  2.  Cellulitis of leg + for MRSA Resolving per spouse, has completed antibiotic course    Advance Care Planning/Goals of Care:  Plan to address at in-person follow up visit in 6 weeks.     Follow up Palliative Care Visit: Palliative care will continue to follow for complex medical decision making, advance care planning, and clarification of goals. Return 6 weeks or prn.   This visit was coded based on  medical decision making (MDM).  PPS: 50%  HOSPICE ELIGIBILITY/DIAGNOSIS: TBD  Chief Complaint:  Seen recently and started on antibiotics for cellulitis in her leg.  HISTORY OF PRESENT ILLNESS:  Victoria Sims is a 81 y.o. year old female with hx of right breast cancer, Alzheimer's Disease, Graves disease, MRSA with recent cellulitis in her leg.  No CP, SOB, nausea, falls.  Great appetite. Constipation "every now and then", takes a laxative twice weekly and stool softener every day. Not straining, no bright red or dark, tarry. Husband bathes and dresses, no incontinence.   History obtained from review of EMR, discussion with family and/or Victoria Sims.  I reviewed available labs, medications, imaging, studies and related documents from the EMR.  Records reviewed and summarized above.   ROS-limited due to dementia, obtained info from spouse as per above  Physical Exam: Current and past weights: No audible SOB when talking  Thank you for the opportunity to participate in the care of Victoria Sims.  The palliative care team will continue to follow. Please call our office at [phone] if we can be of additional assistance.   Marijo Conception, FNP   COVID-19 PATIENT SCREENING TOOL Asked and negative response unless otherwise noted:   Have you had symptoms of covid, tested positive or been in contact with someone with symptoms/positive test in the past 5-10 days?  No

## 2021-07-10 ENCOUNTER — Encounter: Payer: Self-pay | Admitting: Family Medicine

## 2021-07-10 DIAGNOSIS — L03119 Cellulitis of unspecified part of limb: Secondary | ICD-10-CM | POA: Insufficient documentation

## 2021-07-10 DIAGNOSIS — Z515 Encounter for palliative care: Secondary | ICD-10-CM | POA: Insufficient documentation

## 2021-07-20 ENCOUNTER — Ambulatory Visit (INDEPENDENT_AMBULATORY_CARE_PROVIDER_SITE_OTHER): Payer: Medicare Other | Admitting: Nurse Practitioner

## 2021-07-20 ENCOUNTER — Other Ambulatory Visit: Payer: Self-pay

## 2021-07-20 ENCOUNTER — Encounter: Payer: Self-pay | Admitting: Nurse Practitioner

## 2021-07-20 VITALS — BP 110/60 | HR 64 | Ht 61.0 in

## 2021-07-20 DIAGNOSIS — N3281 Overactive bladder: Secondary | ICD-10-CM | POA: Diagnosis not present

## 2021-07-20 DIAGNOSIS — E785 Hyperlipidemia, unspecified: Secondary | ICD-10-CM | POA: Diagnosis not present

## 2021-07-20 DIAGNOSIS — M170 Bilateral primary osteoarthritis of knee: Secondary | ICD-10-CM | POA: Diagnosis not present

## 2021-07-20 DIAGNOSIS — E039 Hypothyroidism, unspecified: Secondary | ICD-10-CM

## 2021-07-20 DIAGNOSIS — F17200 Nicotine dependence, unspecified, uncomplicated: Secondary | ICD-10-CM

## 2021-07-20 MED ORDER — MIRABEGRON ER 50 MG PO TB24: 50.0000 mg | ORAL_TABLET | Freq: Every day | ORAL | 3 refills | Status: AC

## 2021-07-20 MED ORDER — DICLOFENAC SODIUM 1 % EX GEL: 4.0000 g | Freq: Every day | CUTANEOUS | 0 refills | Status: AC

## 2021-07-20 NOTE — Assessment & Plan Note (Signed)
Takes levothyroxine 88 mcg tablets daily ?Check TSH at next visit.  ?

## 2021-07-20 NOTE — Progress Notes (Signed)
? ?New Patient Office Visit ? ?Subjective:  ?Patient ID: Victoria Sims, female    DOB: 05-20-1940  Age: 81 y.o. MRN: [id number] ? ?CC:  ?Chief Complaint  ?Patient presents with  ? New Patient (Initial Visit)  ?  NP  ? Leg Pain  ?  Both legs hurt at night about 3 weeks 3/2  ? ? ?HPI ?Victoria Sims with medical history Alzheimer's dementia, CKD strict 3B, ASCVD, acid reflux bilateral primary osteoarthritis of knee DVT, history of malignant neoplasm of breast, overactive bladder presents to establish care for her chronic medical conditions previous PCP Dr Loney Loh at [facility] , last visit was 2 months ago.   ? ?Overactive bladder .pt states that she goes to the bathroom to urinate about 5 times during the night was taking Gemtesa'75mg'$  daily but she stopped taking med due to cost , will like to switch to [employer]. They stated that they were given a sample of myrbetriq '50mg'$   and it helped her condition.  ? ? ?Pt c/o bilateral knee pain at night time when she goes to sleep, has been having the pain for about 3 months now, sometimes has tingling sensations in her knee, mostly use wheelchair at home, gets up and walk sometimes. Pt states that the pain wakes her up at night.pt states that the pt gets a knee infusion every 6 months.  ? ? ?Smokes 2-3 cigartees daily , some days  she does not smoke, Need to quit smoking discussed with patient, not ready to quit today. ? ?Patient is due for COVID booster, pneumonia vaccine, Tdap vaccine shingles vaccine, flu vaccine.  Need for of vaccines discussed with patient patient encouraged to get vaccines at her pharmacy.  Patient refused flu vaccine in the office today. ? ?We will obtain records from previous PCP. ? ?Past Medical History:  ?Diagnosis Date  ? Alzheimer's dementia ([place])   ? Breast cancer ([place]) 2006  ? right  ? Cancer ([place]) 07/2005  ? right breast/ER/PR positive  ? Cataract   ? and dry eye/spots on retina  ? Cataract   ? surgery on right eye 04/12/14   ? Condyloma 01/1998  ? Depression   ? Grave's disease   ? Macular degeneration of both eyes   ? Microhematuria 07/1992  ? negative IVP, [place]  ? MRSA (methicillin resistant Staphylococcus aureus)   ? Personal history of radiation therapy   ? Shingles   ? ? ?Past Surgical History:  ?Procedure Laterality Date  ? ABDOMINAL HYSTERECTOMY    ? BREAST BIOPSY Bilateral 1990  ? BREAST LUMPECTOMY Right   ? CATARACT EXTRACTION  04/12/14  ? right eye  ? CATARACT EXTRACTION  2/16  ? left eye  ? CHOLECYSTECTOMY    ? EYE SURGERY    ? goiter  7/93  ? radioactive iodine  ? INGUINAL HERNIA REPAIR  09/29/2011  ? Procedure: HERNIA REPAIR INGUINAL ADULT;  Surgeon: Rolm Bookbinder, MD;  Location: [facility];  Service: General;  Laterality: Right;  ? TUBAL LIGATION    ? D&C  ? ? ?Family History  ?Problem Relation Age of Onset  ? Hypertension Mother   ? Heart disease Mother   ? Breast cancer Neg Hx   ? ? ?Social History  ? ?Socioeconomic History  ? Marital status: Married  ?  Spouse name: Not on file  ? Number of children: Not on file  ? Years of education: Not on file  ? Highest education level: Not on file  ?Occupational  History  ? Not on file  ?Tobacco Use  ? Smoking status: Some Days  ?  Packs/day: 0.50  ?  Years: 54.00  ?  Pack years: 27.00  ?  Types: Cigarettes  ? Smokeless tobacco: Never  ?Vaping Use  ? Vaping Use: Never used  ?Substance and Sexual Activity  ? Alcohol use: No  ?  Alcohol/week: 0.0 standard drinks  ? Drug use: No  ? Sexual activity: Yes  ?  Partners: Male  ?  Birth control/protection: Surgical  ?  Comment: hysterectomy  ?Other Topics Concern  ? Not on file  ?Social History Narrative  ? Lives with her husband   ? ?Social Determinants of Health  ? ?Financial Resource Strain: Not on file  ?Food Insecurity: Not on file  ?Transportation Needs: Not on file  ?Physical Activity: Not on file  ?Stress: Not on file  ?Social Connections: Not on file  ?Intimate Partner Violence: Not on file  ? ? ?ROS ?Review of Systems   ?Constitutional: Negative.   ?Respiratory: Negative.    ?Cardiovascular: Negative.   ?Genitourinary:  Positive for frequency. Negative for difficulty urinating, dyspareunia, dysuria, enuresis, genital sores and hematuria.  ?Musculoskeletal:  Positive for arthralgias.  ?Psychiatric/Behavioral: Negative.    ? ?Objective:  ? ?Today's Vitals: BP 110/60 (BP Location: Right Arm, Patient Position: Sitting, Cuff Size: Large)   Pulse 64   Ht '5\' 1"'$  (1.549 m)   SpO2 100%   BMI 37.60 kg/m?  ? ?Physical Exam ?Constitutional:   ?   General: She is not in acute distress. ?   Appearance: She is obese. She is not ill-appearing, toxic-appearing or diaphoretic.  ?Cardiovascular:  ?   Rate and Rhythm: Regular rhythm. Tachycardia present.  ?   Pulses: Normal pulses.  ?   Heart sounds: Normal heart sounds. No murmur heard. ?  No friction rub. No gallop.  ?Pulmonary:  ?   Effort: Pulmonary effort is normal. No respiratory distress.  ?   Breath sounds: Normal breath sounds. No stridor. No wheezing, rhonchi or rales.  ?Chest:  ?   Chest wall: No tenderness.  ?Abdominal:  ?   Palpations: Abdomen is soft.  ?Musculoskeletal:  ?   Comments: Patient sitting in a wheel chair limited range of motion of bilateral knee, tenderness on ROM of right  ?Neurological:  ?   Mental Status: She is alert.  ? ? ?Assessment & Plan:  ? ?Problem List Items Addressed This Visit   ? ?  ? Endocrine  ? Hypothyroidism  ?  Takes levothyroxine 88 mcg tablets daily ?Check TSH at next visit.  ?  ?  ?  ? Musculoskeletal and Integument  ? Bilateral primary osteoarthritis of knee - Primary  ?  States that she gets knee infusion every 6 months ?Refill Voltaren gel use daily at bedtime. ?Engage in regular exercise daily as tolerated.  ?  ?  ? Relevant Medications  ? diclofenac Sodium (VOLTAREN) 1 % GEL  ?  ? Genitourinary  ? Overactive bladder  ?  Previously taking Gemtiza '75mg'$  daily, stopped taking med due to cost.  ?Has tried taking Myrbetriq states that it works good  for her  ?Rx Myrbetriq 50 mg daily. ?  ?  ? Relevant Medications  ? mirabegron ER (MYRBETRIQ) 50 MG TB24 tablet  ?  ? Other  ? Smoker  ?  Smokes 2-3 cigartees daily , some days  she does not smoke, Need to quit smoking discussed with patient, not ready to quit today. ? ?  ?  ?  Dyslipidemia  ?  On Zetia 10 mg daily, Crestor 20 mg daily ?Continue current medications ?Check lipid panel at next visit ?  ?  ? ? ?Outpatient Encounter Medications as of 07/20/2021  ?Medication Sig  ? aspirin 81 MG EC tablet Take 81 mg by mouth daily.  ? Calcium-Vitamin D (CALTRATE 600 PLUS-VIT D PO) Take 1 tablet by mouth daily.  ? escitalopram (LEXAPRO) 20 MG tablet Take 40 mg by mouth daily.  ? ezetimibe (ZETIA) 10 MG tablet TAKE 1 TABLET BY MOUTH DAILY  ? furosemide (LASIX) 20 MG tablet TAKE 1 TABLET BY MOUTH EVERY DAY AS NEEDED FOR SWELLING  ? levothyroxine (SYNTHROID, LEVOTHROID) 88 MCG tablet Take 88 mcg by mouth daily.  ? mirabegron ER (MYRBETRIQ) 50 MG TB24 tablet Take 1 tablet (50 mg total) by mouth daily.  ? pantoprazole (PROTONIX) 40 MG tablet Take by mouth.  ? rosuvastatin (CRESTOR) 20 MG tablet TAKE 1 TABLET BY MOUTH EVERY DAY  ? [DISCONTINUED] LORazepam (ATIVAN) 1 MG tablet Take 1-2 tablets 30 minutes prior to MRI, may repeat once as needed. Must have driver.  ? diclofenac Sodium (VOLTAREN) 1 % GEL Apply 4 g topically at bedtime.  ? [DISCONTINUED] cephALEXin (KEFLEX) 500 MG capsule Take 500 mg by mouth 2 (two) times daily.  ? [DISCONTINUED] diclofenac Sodium (VOLTAREN) 1 % GEL Apply 4 g topically in the morning, at noon, and at bedtime. (Patient not taking: Reported on 07/20/2021)  ? ?No facility-administered encounter medications on file as of 07/20/2021.  ? ? ?Follow-up: Return in about 3 months (around 10/20/2021).  ? ?Renee Rival, FNP ? ?

## 2021-07-20 NOTE — Patient Instructions (Addendum)
Take myrbetriq '50mg'$  daily for your overreactive bladder. ?Use Voltaren gel at night time daily for your knee arthritis  ? ?Please get your shingles vaccine, TDAP vaccine, COVID booster and influenza vaccines at your pharmacy   ? ?It is important that you exercise regularly as tolerated  least 30 minutes 5 times a week.  ?Think about what you will eat, plan ahead. ?Choose " clean, green, fresh or frozen" over canned, processed or packaged foods which are more sugary, salty and fatty. ?70 to 75% of food eaten should be vegetables and fruit. ?Three meals at set times with snacks allowed between meals, but they must be fruit or vegetables. ?Aim to eat over a 12 hour period , example 7 am to 7 pm, and STOP after  your last meal of the day. ?Drink water,generally about 64 ounces per day, no other drink is as healthy. Fruit juice is best enjoyed in a healthy way, by EATING the fruit. ? ?Thanks for choosing [facility], we consider it a privelige to serve you.  ?

## 2021-07-20 NOTE — Assessment & Plan Note (Signed)
Smokes 2-3 cigartees daily , some days  she does not smoke, Need to quit smoking discussed with patient, not ready to quit today. ? ?

## 2021-07-20 NOTE — Assessment & Plan Note (Signed)
On Zetia 10 mg daily, Crestor 20 mg daily ?Continue current medications ?Check lipid panel at next visit ?

## 2021-07-20 NOTE — Assessment & Plan Note (Signed)
States that she gets knee infusion every 6 months ?Refill Voltaren gel use daily at bedtime. ?Engage in regular exercise daily as tolerated.  ?

## 2021-07-20 NOTE — Assessment & Plan Note (Signed)
Previously taking Gemtiza '75mg'$  daily, stopped taking med due to cost.  ?Has tried taking Myrbetriq states that it works good for her  ?Rx Myrbetriq 50 mg daily. ?

## 2021-07-25 DIAGNOSIS — Z20828 Contact with and (suspected) exposure to other viral communicable diseases: Secondary | ICD-10-CM | POA: Diagnosis not present

## 2021-08-15 ENCOUNTER — Other Ambulatory Visit: Payer: Medicare Other | Admitting: Family Medicine

## 2021-08-16 ENCOUNTER — Emergency Department (HOSPITAL_COMMUNITY): Payer: Medicare Other

## 2021-08-16 ENCOUNTER — Other Ambulatory Visit: Payer: Self-pay

## 2021-08-16 ENCOUNTER — Telehealth: Payer: Self-pay

## 2021-08-16 ENCOUNTER — Other Ambulatory Visit: Payer: Medicare Other | Admitting: Family Medicine

## 2021-08-16 ENCOUNTER — Encounter: Payer: Self-pay | Admitting: Family Medicine

## 2021-08-16 ENCOUNTER — Emergency Department (HOSPITAL_COMMUNITY)
Admission: EM | Admit: 2021-08-16 | Discharge: 2021-08-16 | Disposition: A | Discharge status: Home / Self Care | Payer: Medicare Other | Attending: Student | Admitting: Student

## 2021-08-16 ENCOUNTER — Encounter (HOSPITAL_COMMUNITY): Payer: Self-pay | Admitting: *Deleted

## 2021-08-16 VITALS — BP 140/74 | HR 82 | Resp 18

## 2021-08-16 DIAGNOSIS — L0201 Cutaneous abscess of face: Secondary | ICD-10-CM

## 2021-08-16 DIAGNOSIS — L989 Disorder of the skin and subcutaneous tissue, unspecified: Secondary | ICD-10-CM | POA: Diagnosis not present

## 2021-08-16 DIAGNOSIS — R220 Localized swelling, mass and lump, head: Secondary | ICD-10-CM | POA: Diagnosis not present

## 2021-08-16 DIAGNOSIS — L03213 Periorbital cellulitis: Secondary | ICD-10-CM

## 2021-08-16 DIAGNOSIS — L02811 Cutaneous abscess of head [any part, except face]: Secondary | ICD-10-CM | POA: Diagnosis not present

## 2021-08-16 DIAGNOSIS — G309 Alzheimer's disease, unspecified: Secondary | ICD-10-CM | POA: Diagnosis not present

## 2021-08-16 DIAGNOSIS — Z853 Personal history of malignant neoplasm of breast: Secondary | ICD-10-CM | POA: Diagnosis not present

## 2021-08-16 DIAGNOSIS — F03B0 Unspecified dementia, moderate, without behavioral disturbance, psychotic disturbance, mood disturbance, and anxiety: Secondary | ICD-10-CM | POA: Diagnosis not present

## 2021-08-16 DIAGNOSIS — E039 Hypothyroidism, unspecified: Secondary | ICD-10-CM | POA: Insufficient documentation

## 2021-08-16 DIAGNOSIS — F17210 Nicotine dependence, cigarettes, uncomplicated: Secondary | ICD-10-CM | POA: Diagnosis not present

## 2021-08-16 DIAGNOSIS — N183 Chronic kidney disease, stage 3 unspecified: Secondary | ICD-10-CM | POA: Diagnosis not present

## 2021-08-16 DIAGNOSIS — H5712 Ocular pain, left eye: Secondary | ICD-10-CM | POA: Diagnosis present

## 2021-08-16 DIAGNOSIS — Z8614 Personal history of Methicillin resistant Staphylococcus aureus infection: Secondary | ICD-10-CM

## 2021-08-16 DIAGNOSIS — F0280 Dementia in other diseases classified elsewhere without behavioral disturbance: Secondary | ICD-10-CM | POA: Insufficient documentation

## 2021-08-16 LAB — I-STAT CHEM 8, ED
BUN: 20 mg/dL (ref 8–23)
Calcium, Ion: 0.96 mmol/L — ABNORMAL LOW (ref 1.15–1.40)
Chloride: 106 mmol/L (ref 98–111)
Creatinine, Ser: 1.2 mg/dL — ABNORMAL HIGH (ref 0.44–1.00)
Glucose, Bld: 96 mg/dL (ref 70–99)
HCT: 37 % (ref 36.0–46.0)
Hemoglobin: 12.6 g/dL (ref 12.0–15.0)
Potassium: 4.2 mmol/L (ref 3.5–5.1)
Sodium: 140 mmol/L (ref 135–145)
TCO2: 27 mmol/L (ref 22–32)

## 2021-08-16 MED ORDER — SODIUM CHLORIDE 0.9 % IV SOLN
3.0000 g | Freq: Four times a day (QID) | INTRAVENOUS | Status: DC
  Administered 2021-08-16: 3 g via INTRAVENOUS
  Filled 2021-08-16: qty 8

## 2021-08-16 MED ORDER — IOHEXOL 300 MG/ML  SOLN
100.0000 mL | Freq: Once | INTRAMUSCULAR | Status: AC | PRN
  Administered 2021-08-16: 75 mL via INTRAVENOUS

## 2021-08-16 MED ORDER — LORAZEPAM 2 MG/ML IJ SOLN
1.0000 mg | Freq: Once | INTRAMUSCULAR | Status: AC
  Administered 2021-08-16: 1 mg via INTRAVENOUS
  Filled 2021-08-16: qty 1

## 2021-08-16 MED ORDER — LIDOCAINE HCL (PF) 1 % IJ SOLN
5.0000 mL | Freq: Once | INTRAMUSCULAR | Status: AC
  Administered 2021-08-16: 5 mL via INTRADERMAL
  Filled 2021-08-16: qty 5

## 2021-08-16 MED ORDER — LORAZEPAM 2 MG/ML IJ SOLN: 0.5000 mg | Freq: Once | INTRAMUSCULAR | Status: AC

## 2021-08-16 MED ORDER — AMOXICILLIN-POT CLAVULANATE 875-125 MG PO TABS: 1.0000 | ORAL_TABLET | Freq: Two times a day (BID) | ORAL | 0 refills | Status: DC

## 2021-08-16 MED ORDER — LORAZEPAM 2 MG/ML IJ SOLN
INTRAMUSCULAR | Status: AC
  Administered 2021-08-16: 0.5 mg via INTRAVENOUS
  Filled 2021-08-16: qty 1

## 2021-08-16 NOTE — ED Notes (Signed)
Upon assessment patient soiled with urine. Pericare provided, full bed linen changed, and purwick placed. ?

## 2021-08-16 NOTE — Progress Notes (Signed)
? ? ?Manufacturing engineer ?Community Palliative Care Consult Note ?Telephone: [phone]  ?Fax: [phone]  ? ? ?Date of encounter: 08/16/21 ?9:21 AM ?PATIENT NAME: Victoria Sims ?Victoria Sims [address]   ?[phone] (home)  ?DOB: 1940-05-21 ?MRN: [id number] ?PRIMARY CARE PROVIDER:    ?Victoria Rival, Sims,  ?[address] ?[place] [address] ?[phone] ? ?REFERRING PROVIDER:   ?Victoria Sims, [place] ?[place],  [place] [address] ?[phone] ? ?RESPONSIBLE PARTY:    ?Contact Information   ? ? Name Relation Home Work Mobile  ? Sims,Victoria Spouse   [phone]  ? Victoria Sims Sister   [phone]  ? ?  ? ? ? ?I met face to face with patient and spouse I, Victoria Sims, in their home. Palliative Care was asked to follow this patient by consultation request of Victoria Sims to address advance care planning and complex medical decision making. This is a follow up visit ? ?ASSESSMENT, SYMPTOM MANAGEMENT AND PLAN / RECOMMENDATIONS:  ? Periorbital cellulitis/skin lesion of face (left upper eyelid) ?Discussed with PCP Victoria Sims through her nurse options: ?-PCP office visit ?-Palliative Care NP starting antibiotics with coverage for MRSA ?-Referral to Ophthalmology or general surgery for  I & D, possible bx of fluctuant mass ?-ED visit with possible sedation to allow for I & D and IV antibiotics ? ?Last option chosen given pt's hesitancy to allow area to be examined and dementia. ?Possible basal cell carcinoma (differential diagnosis) ? ? Personal hx of MRSA ?Personal hx of sulfa allergy and hx of MRSA ?Would recommend coverage for MRSA. ? ? Moderate Dementia, unspecified type and whether or not behavioral   disturbance present ?Given pt's intermittent agitation with spouse she may benefit from having I & D in controlled environment where sedation may be possible and for pt to receive IV antibiotics if needed depending on  severity of infection. ?Need to address goals of care and code status on next follow up in 2 weeks. ? ?CODE STATUS: ?Full Code ? ? ? ? ?Follow up Palliative Care Visit: Palliative care will continue to follow for complex medical decision making, advance care planning, and clarification of goals. Return 2 weeks or prn. ? ? ? ?This visit was coded based on medical decision making (MDM). ? ?PPS: 50% ? ?HOSPICE ELIGIBILITY/DIAGNOSIS: TBD ? ?Chief Complaint:  ?Palliative is following for chronic medical management in patient with dementia.  She currently has a left eyelid lesion that is draining. ? ?HISTORY OF PRESENT ILLNESS:  Victoria Sims is a 81 y.o. year old female with dementia, ASCVD, GERD, Hypothyroidism with Basedow disease, osteoporosis, hx of breast cancer s/p right lumpectomy, OAB, OA of knees, hx of DVT, urge incontinence, smoker and hx of MRSA with allergy to Sulfa medications.  Spouse states lesion on upper left eyelid came up about a week ago and pt began scratching at it with subsequent rapid increase in size.  He states he has been using warm, wet compresses on it and it has drained some "white, like pus" material.  Pt is not a good historian and can't specify if she is having visual problems or not.  Does report some pain with eye movement but unclear if this is the eye itself or movement of the eye under the lesion.  Spouse does not think she  has had fever and no nausea, vomiting.  She has to have help with all ADLs and is very fearful of anyone  touching or manipulating this area.   ?Pt requires assist with bathing and dressing, has some incontinence of bowel and bladder.  Spouse states degree of dependent edema is stable for pt.  She can be aggressive towards him at times and resistive to care.  Denies falls or eye injury. Sleeping well.  Spoke with nurse for PCP -Victoria Rua, Sims.  They have no openings for her today but after discussion with close proximity to eye and infection, will send  pt to ED [facility] to be evaluated in case she needs conscious sedation to I & d, Bx or IV antibiotics.  Pt flinches from direct exam of eyelid.  She may start to respond to a question but will  and give a non-sensical response.  While I was there, pt refused to allow spouse to take her to the bathroom in her wheelchair until I said it was ok. ? ?History obtained from review of EMR, discussion with primary team, and interview with spouse and/or Victoria Sims.  ?I reviewed available labs, medications, imaging, studies and related documents from the EMR.  Records reviewed and summarized above.  ? ?ROS ?Unreliable historian with severe dementia ? ?Physical Exam: ?Constitutional: NAD ?General: frail appearing, obese  ?EYES: anicteric sclera without injection, right eyelids intact, no discharge from eye ?ENMT: hard of hearing, oral mucous membranes moist ?CV: S1S2, RRR with LUSB murmur, 2-3+ BLE edema ?Pulmonary: CTAB, no increased work of breathing, no cough, room air ?Abdomen: normo-active BS + 4 quadrants, soft and non tender, no ascites ?GU: deferred ?MSK: no sarcopenia, moves all extremities, wc bound ?Skin: warm and dry, has mounded/dime sized area with dark scab centrally on left upper eyelid under palpebral fissure. Entire mass lesion, surrounding eyelid and periorbital skin circumferentially erythematous and tender with pt flinching away from exam. ?Neuro:  noted BLE generalized weakness, significant cognitive impairment ?Psych: mildly anxious affect, A and O to self ?Hem/lymph/immuno: no widespread bruising ? ? ?Thank you for the opportunity to participate in the care of Victoria Sims.  The palliative care team will continue to follow. Please call our office at [phone] if we can be of additional assistance.  ? ?Victoria Conception, Sims -C ? ?COVID-19 PATIENT SCREENING TOOL ?Asked and negative response unless otherwise noted:  ? ?Have you had symptoms of covid, tested positive or been in contact with someone  with symptoms/positive test in the past 5-10 days?  No ?

## 2021-08-16 NOTE — ED Provider Notes (Signed)
?[facility] ?Provider Note ? ?CSN: [id number] ?Arrival date & time: 08/16/21 1129 ? ?Chief Complaint(s) ?Eye Pain ? ?HPI ?Victoria Sims is a 81 y.o. female with PMH Alzheimer's dementia, Graves' disease who presents emergency department for evaluation of left eye lesion.  History obtained from patient's friend and caretaker who states that over the last week this lesion has grown significantly in size.  States that the lesion started off small but the patient has picked at the lesion and it is since gotten significantly larger.  Additional history unable to be obtained secondary to the patient's underlying dementia. ? ? ?Eye Pain ? ? ?Past Medical History ?Past Medical History:  ?Diagnosis Date  ? Alzheimer's dementia ([facility])   ? Breast cancer ([facility]) 2006  ? right  ? Cancer ([facility]) 07/2005  ? right breast/ER/PR positive  ? Cataract   ? and dry eye/spots on retina  ? Cataract   ? surgery on right eye 04/12/14  ? CKD (chronic kidney disease) stage 3, GFR 30-59 ml/min (HCC)   ? Condyloma 01/1998  ? Depression   ? Grave's disease   ? Macular degeneration of both eyes   ? Microhematuria 07/1992  ? negative IVP, [place]  ? MRSA (methicillin resistant Staphylococcus aureus)   ? Personal history of radiation therapy   ? Shingles   ? ?Patient Active Problem List  ? Diagnosis Date Noted  ? Overactive bladder 07/20/2021  ? Hypothyroidism 07/20/2021  ? Palliative care encounter 07/10/2021  ? Cellulitis and abscess of leg 07/10/2021  ? Memory loss 12/10/2019  ? Bilateral primary osteoarthritis of knee 03/20/2017  ? Chest discomfort 09/19/2016  ? Heart palpitations 08/15/2016  ? ASCVD (arteriosclerotic cardiovascular disease) 02/08/2016  ? Claudication ([facility]) 01/04/2016  ? Dyslipidemia 01/04/2016  ? Calcification of coronary artery 01/04/2016  ? Chest pain, unspecified 01/04/2016  ? Cystocele 06/06/2014  ? Acid reflux 03/31/2014  ? H/O malignant neoplasm of breast 03/31/2014  ? H/O infectious disease 03/31/2014  ? HLD  (hyperlipidemia) 03/31/2014  ? OP (osteoporosis) 03/31/2014  ? Smoker 03/31/2014  ? MRSA (methicillin resistant staph aureus) culture positive 12/04/2013  ? History of DVT (deep vein thrombosis) 05/18/2013  ? Cataract 01/20/2013  ? Basedow disease 03/18/2012  ? Urge incontinence 07/11/2011  ? ?Home Medication(s) ?Prior to Admission medications   ?Medication Sig Start Date End Date Taking? Authorizing Provider  ?aspirin 81 MG EC tablet Take 81 mg by mouth daily. 10/06/15   [provider]  ?diclofenac Sodium (VOLTAREN) 1 % GEL Apply 4 g topically at bedtime. 07/20/21   Renee Rival, FNP  ?escitalopram (LEXAPRO) 20 MG tablet Take 40 mg by mouth daily.    [provider]  ?ezetimibe (ZETIA) 10 MG tablet TAKE 1 TABLET BY MOUTH DAILY 11/30/20   Hilty, Nadean Corwin, MD  ?furosemide (LASIX) 20 MG tablet TAKE 1 TABLET BY MOUTH EVERY DAY AS NEEDED FOR SWELLING 04/25/21   Hilty, Nadean Corwin, MD  ?levothyroxine (SYNTHROID) 125 MCG tablet Take 125 mcg by mouth daily. 07/24/21   [provider]  ?levothyroxine (SYNTHROID, LEVOTHROID) 88 MCG tablet Take 88 mcg by mouth daily. 01/30/18   [provider]  ?mirabegron ER (MYRBETRIQ) 50 MG TB24 tablet Take 1 tablet (50 mg total) by mouth daily. 07/20/21   Renee Rival, FNP  ?mirtazapine (REMERON) 15 MG tablet Take 15 mg by mouth at bedtime. 07/21/21   [provider]  ?pantoprazole (PROTONIX) 40 MG tablet Take by mouth.    [provider]  ?  potassium chloride SA (KLOR-CON M) 20 MEQ tablet Take 20 mEq by mouth daily. 07/21/21   [provider]  ?rosuvastatin (CRESTOR) 20 MG tablet TAKE 1 TABLET BY MOUTH EVERY DAY 07/07/19   Hilty, Nadean Corwin, MD  ?                                                                                                                                  ?Past Surgical History ?Past Surgical History:  ?Procedure Laterality Date  ? ABDOMINAL HYSTERECTOMY    ? BREAST BIOPSY Bilateral 1990  ? BREAST  LUMPECTOMY Right   ? CATARACT EXTRACTION  04/12/14  ? right eye  ? CATARACT EXTRACTION  2/16  ? left eye  ? CHOLECYSTECTOMY    ? EYE SURGERY    ? goiter  7/93  ? radioactive iodine  ? INGUINAL HERNIA REPAIR  09/29/2011  ? Procedure: HERNIA REPAIR INGUINAL ADULT;  Surgeon: Rolm Bookbinder, MD;  Location: [facility];  Service: General;  Laterality: Right;  ? TUBAL LIGATION    ? D&C  ? ?Family History ?Family History  ?Problem Relation Age of Onset  ? Hypertension Mother   ? Heart disease Mother   ? Breast cancer Neg Hx   ? ? ?Social History ?Social History  ? ?Tobacco Use  ? Smoking status: Some Days  ?  Packs/day: 0.50  ?  Years: 54.00  ?  Pack years: 27.00  ?  Types: Cigarettes  ? Smokeless tobacco: Never  ?Vaping Use  ? Vaping Use: Never used  ?Substance Use Topics  ? Alcohol use: No  ?  Alcohol/week: 0.0 standard drinks  ? Drug use: No  ? ?Allergies ?Atorvastatin, Gabapentin, Macrobid [nitrofurantoin macrocrystal], and Sulfa antibiotics ? ?Review of Systems ?Review of Systems  ?Unable to perform ROS: Dementia  ?Eyes:  Positive for pain.  ? ?Physical Exam ?Vital Signs  ?I have reviewed the triage vital signs ?BP (!) 130/91   Pulse 80   Temp 97.9 ?F (36.6 ?C) (Oral)   Resp 18   Ht '5\' 1"'$  (1.549 m)   Wt 86.2 kg   SpO2 96%   BMI 35.90 kg/m?  ? ?Physical Exam ?Vitals and nursing note reviewed.  ?Constitutional:   ?   General: She is not in acute distress. ?   Appearance: She is well-developed.  ?HENT:  ?   Head: Normocephalic.  ?   Comments: 2 cm area of tender fluctuant fluid collection in the eyebrow on the left with surrounding erythema extending circumferentially around the orbit, no conjunctival injection ?Eyes:  ?   Conjunctiva/sclera: Conjunctivae normal.  ?Cardiovascular:  ?   Rate and Rhythm: Normal rate and regular rhythm.  ?   Heart sounds: No murmur heard. ?Pulmonary:  ?   Effort: Pulmonary effort is normal. No respiratory distress.  ?   Breath sounds: Normal breath sounds.  ?Abdominal:  ?   Palpations:  Abdomen is soft.  ?  Tenderness: There is no abdominal tenderness.  ?Musculoskeletal:     ?   General: No swelling.  ?   Cervical back: Neck supple.  ?Skin: ?   General: Skin is warm and dry.  ?   Capillary Refill: Capillary refill takes less than 2 seconds.  ?Neurological:  ?   Mental Status: She is alert.  ?Psychiatric:     ?   Mood and Affect: Mood normal.  ? ? ?ED Results and Treatments ?Labs ?(all labs ordered are listed, but only abnormal results are displayed) ?Labs Reviewed  ?I-STAT CHEM 8, ED - Abnormal; Notable for the following components:  ?    Result Value  ? Creatinine, Ser 1.20 (*)   ? Calcium, Ion 0.96 (*)   ? All other components within normal limits  ?                                                                                                                       ? ?Radiology ?CT Maxillofacial W Contrast ? ?Result Date: 08/16/2021 ?CLINICAL DATA:  Facial abscess. Swelling and redness above left eye. EXAM: CT MAXILLOFACIAL WITH CONTRAST TECHNIQUE: Multidetector CT imaging of the maxillofacial structures was performed with intravenous contrast. Multiplanar CT image reconstructions were also generated. RADIATION DOSE REDUCTION: This exam was performed according to the departmental dose-optimization program which includes automated exposure control, adjustment of the mA and/or kV according to patient size and/or use of iterative reconstruction technique. CONTRAST:  59m OMNIPAQUE IOHEXOL 300 MG/ML  SOLN COMPARISON:  CT head 04/25/2021 FINDINGS: Osseous: No fracture or bony destruction. Image quality degraded by extensive motion. Patient not able to hold still for the study. Orbits: Bilateral cataract extraction. No mass or edema within the orbit. Sinuses: Mucosal edema in the maxillary sinus bilaterally. Remaining sinuses clear. Mastoid clear. Soft tissues: Focal soft tissue swelling in the region left eyebrow. There is central fluid collection measuring 13 mm in diameter. This is consistent with  an abscess given the clinical findings. There is mild surrounding edema in the soft tissues which does not extend into the orbit. Limited intracranial: Negative IMPRESSION: 13 mm fluid collection in the soft tissues

## 2021-08-16 NOTE — Telephone Encounter (Signed)
Damaris Hippo RN with [facility] called in stating that she went in to visit pt today and she has a lesion for about a week non healing scab that is was draining but not draining at this time on her left eye under eyebrow, eyelid is red, swollen, and drooping, very tender and sore. Nurse is wanting to know if she should go to ED, referral, or doxy antibiotic. Pt has dementia and the nurse is wanting to know what she should do. Spoke with fola she advised would need to be seen, check with front staff no availability today. Per karen the nurse she is going to tell the pt's husband to take pt to the ER due to the amount of infection, the pt's condition, and that its close to her eye. ?

## 2021-08-16 NOTE — ED Triage Notes (Signed)
Pt has a large bump above left eye with redness and swelling going down her face to her cheek; pt denies any vision changes ?

## 2021-08-18 ENCOUNTER — Encounter: Payer: Self-pay | Admitting: Family Medicine

## 2021-08-18 NOTE — Progress Notes (Signed)
This appointment was cancelled, note was open in error.  Appt was rescheduled for 08/16/21.  Victoria Hippo FNP-C ?

## 2021-08-21 ENCOUNTER — Other Ambulatory Visit: Payer: Self-pay | Admitting: Nurse Practitioner

## 2021-08-29 DIAGNOSIS — L03115 Cellulitis of right lower limb: Secondary | ICD-10-CM | POA: Diagnosis not present

## 2021-08-29 DIAGNOSIS — R52 Pain, unspecified: Secondary | ICD-10-CM | POA: Diagnosis not present

## 2021-08-29 DIAGNOSIS — F17210 Nicotine dependence, cigarettes, uncomplicated: Secondary | ICD-10-CM | POA: Diagnosis not present

## 2021-08-29 DIAGNOSIS — G309 Alzheimer's disease, unspecified: Secondary | ICD-10-CM | POA: Diagnosis not present

## 2021-08-29 DIAGNOSIS — Z79890 Hormone replacement therapy (postmenopausal): Secondary | ICD-10-CM | POA: Diagnosis not present

## 2021-08-29 DIAGNOSIS — R079 Chest pain, unspecified: Secondary | ICD-10-CM | POA: Diagnosis not present

## 2021-08-29 DIAGNOSIS — L03116 Cellulitis of left lower limb: Secondary | ICD-10-CM | POA: Diagnosis not present

## 2021-08-29 DIAGNOSIS — E876 Hypokalemia: Secondary | ICD-10-CM | POA: Diagnosis not present

## 2021-08-29 DIAGNOSIS — E039 Hypothyroidism, unspecified: Secondary | ICD-10-CM | POA: Diagnosis not present

## 2021-08-29 DIAGNOSIS — I890 Lymphedema, not elsewhere classified: Secondary | ICD-10-CM | POA: Diagnosis not present

## 2021-08-29 DIAGNOSIS — Z792 Long term (current) use of antibiotics: Secondary | ICD-10-CM | POA: Diagnosis not present

## 2021-08-29 DIAGNOSIS — Z7982 Long term (current) use of aspirin: Secondary | ICD-10-CM | POA: Diagnosis not present

## 2021-08-29 DIAGNOSIS — Z882 Allergy status to sulfonamides status: Secondary | ICD-10-CM | POA: Diagnosis not present

## 2021-08-29 DIAGNOSIS — R410 Disorientation, unspecified: Secondary | ICD-10-CM | POA: Diagnosis not present

## 2021-08-29 DIAGNOSIS — J439 Emphysema, unspecified: Secondary | ICD-10-CM | POA: Diagnosis not present

## 2021-08-29 DIAGNOSIS — N1832 Chronic kidney disease, stage 3b: Secondary | ICD-10-CM | POA: Diagnosis not present

## 2021-08-29 DIAGNOSIS — Z20822 Contact with and (suspected) exposure to covid-19: Secondary | ICD-10-CM | POA: Diagnosis not present

## 2021-08-29 DIAGNOSIS — R456 Violent behavior: Secondary | ICD-10-CM | POA: Diagnosis not present

## 2021-08-29 DIAGNOSIS — I5032 Chronic diastolic (congestive) heart failure: Secondary | ICD-10-CM | POA: Diagnosis not present

## 2021-08-29 DIAGNOSIS — Z79899 Other long term (current) drug therapy: Secondary | ICD-10-CM | POA: Diagnosis not present

## 2021-08-29 DIAGNOSIS — M7989 Other specified soft tissue disorders: Secondary | ICD-10-CM | POA: Diagnosis not present

## 2021-08-29 DIAGNOSIS — F0280 Dementia in other diseases classified elsewhere without behavioral disturbance: Secondary | ICD-10-CM | POA: Diagnosis not present

## 2021-08-30 DIAGNOSIS — G309 Alzheimer's disease, unspecified: Secondary | ICD-10-CM | POA: Diagnosis not present

## 2021-08-30 DIAGNOSIS — I519 Heart disease, unspecified: Secondary | ICD-10-CM | POA: Diagnosis not present

## 2021-08-30 DIAGNOSIS — E039 Hypothyroidism, unspecified: Secondary | ICD-10-CM | POA: Diagnosis not present

## 2021-08-30 DIAGNOSIS — F0280 Dementia in other diseases classified elsewhere without behavioral disturbance: Secondary | ICD-10-CM | POA: Diagnosis not present

## 2021-08-30 DIAGNOSIS — E785 Hyperlipidemia, unspecified: Secondary | ICD-10-CM | POA: Diagnosis not present

## 2021-08-30 DIAGNOSIS — L03115 Cellulitis of right lower limb: Secondary | ICD-10-CM | POA: Diagnosis not present

## 2021-08-30 DIAGNOSIS — L03116 Cellulitis of left lower limb: Secondary | ICD-10-CM | POA: Diagnosis not present

## 2021-08-30 DIAGNOSIS — Z79899 Other long term (current) drug therapy: Secondary | ICD-10-CM | POA: Diagnosis not present

## 2021-08-30 DIAGNOSIS — N1832 Chronic kidney disease, stage 3b: Secondary | ICD-10-CM | POA: Diagnosis not present

## 2021-08-30 DIAGNOSIS — Z792 Long term (current) use of antibiotics: Secondary | ICD-10-CM | POA: Diagnosis not present

## 2021-08-30 DIAGNOSIS — I517 Cardiomegaly: Secondary | ICD-10-CM | POA: Diagnosis not present

## 2021-08-30 DIAGNOSIS — R600 Localized edema: Secondary | ICD-10-CM | POA: Diagnosis not present

## 2021-08-30 DIAGNOSIS — E876 Hypokalemia: Secondary | ICD-10-CM | POA: Diagnosis not present

## 2021-08-31 DIAGNOSIS — L03115 Cellulitis of right lower limb: Secondary | ICD-10-CM | POA: Diagnosis not present

## 2021-08-31 DIAGNOSIS — L03116 Cellulitis of left lower limb: Secondary | ICD-10-CM | POA: Diagnosis not present

## 2021-08-31 DIAGNOSIS — E876 Hypokalemia: Secondary | ICD-10-CM | POA: Diagnosis not present

## 2021-08-31 DIAGNOSIS — I890 Lymphedema, not elsewhere classified: Secondary | ICD-10-CM | POA: Diagnosis not present

## 2021-08-31 DIAGNOSIS — G309 Alzheimer's disease, unspecified: Secondary | ICD-10-CM | POA: Diagnosis not present

## 2021-08-31 DIAGNOSIS — F0280 Dementia in other diseases classified elsewhere without behavioral disturbance: Secondary | ICD-10-CM | POA: Diagnosis not present

## 2021-08-31 DIAGNOSIS — E039 Hypothyroidism, unspecified: Secondary | ICD-10-CM | POA: Diagnosis not present

## 2021-08-31 DIAGNOSIS — N189 Chronic kidney disease, unspecified: Secondary | ICD-10-CM | POA: Diagnosis not present

## 2021-09-01 ENCOUNTER — Telehealth: Payer: Self-pay | Admitting: *Deleted

## 2021-09-01 NOTE — Telephone Encounter (Signed)
Transition Care Management Follow-up Telephone Call ?Date of discharge and from where: 08-31-21 [facility]  ?How have you been since you were released from the hospital? Pt is doing good  ?Any questions or concerns? Yes ? ?Items Reviewed: ?Did the pt receive and understand the discharge instructions provided? Yes  ?Medications obtained and verified? Yes  ?Other? Yes  ?Any new allergies since your discharge? No  ?Dietary orders reviewed? Yes ?Do you have support at home? Yes  ? ?Home Care and Equipment/Supplies: ?Were home health services ordered? no ?If so, what is the name of the agency? NA  ?Has the agency set up a time to come to the patient's home? not applicable ?Were any new equipment or medical supplies ordered?  No ?What is the name of the medical supply agency? NA ?Were you able to get the supplies/equipment? not applicable ?Do you have any questions related to the use of the equipment or supplies? No ? ?Functional Questionnaire: (I = Independent and D = Dependent) ?ADLs: i ? ?Bathing/Dressing- i ? ?Meal Prep- i ? ?Eating- i ? ?Maintaining continence- i ? ?Transferring/Ambulation- i ? ?Managing Meds- i ? ?Follow up appointments reviewed: ? ?PCP Hospital f/u appt confirmed? Yes  Scheduled to see 09-05-21 on [place] @ 2:40 . ?[facility] f/u appt confirmed? No   ?Are transportation arrangements needed? No  ?If their condition worsens, is the pt aware to call PCP or go to the Emergency Dept.? Yes ?Was the patient provided with contact information for the PCP's office or ED? Yes ?Was to pt encouraged to call back with questions or concerns? Yes  ?

## 2021-09-04 ENCOUNTER — Telehealth: Payer: Self-pay | Admitting: Nurse Practitioner

## 2021-09-04 DIAGNOSIS — Z7401 Bed confinement status: Secondary | ICD-10-CM | POA: Diagnosis not present

## 2021-09-04 DIAGNOSIS — R911 Solitary pulmonary nodule: Secondary | ICD-10-CM | POA: Diagnosis not present

## 2021-09-04 DIAGNOSIS — G309 Alzheimer's disease, unspecified: Secondary | ICD-10-CM | POA: Diagnosis not present

## 2021-09-04 DIAGNOSIS — I890 Lymphedema, not elsewhere classified: Secondary | ICD-10-CM | POA: Diagnosis not present

## 2021-09-04 DIAGNOSIS — E876 Hypokalemia: Secondary | ICD-10-CM | POA: Diagnosis not present

## 2021-09-04 DIAGNOSIS — L03116 Cellulitis of left lower limb: Secondary | ICD-10-CM | POA: Diagnosis not present

## 2021-09-04 DIAGNOSIS — J439 Emphysema, unspecified: Secondary | ICD-10-CM | POA: Diagnosis not present

## 2021-09-04 DIAGNOSIS — Z7982 Long term (current) use of aspirin: Secondary | ICD-10-CM | POA: Diagnosis not present

## 2021-09-04 DIAGNOSIS — E039 Hypothyroidism, unspecified: Secondary | ICD-10-CM | POA: Diagnosis not present

## 2021-09-04 DIAGNOSIS — L03115 Cellulitis of right lower limb: Secondary | ICD-10-CM | POA: Diagnosis not present

## 2021-09-04 DIAGNOSIS — F17210 Nicotine dependence, cigarettes, uncomplicated: Secondary | ICD-10-CM | POA: Diagnosis not present

## 2021-09-04 DIAGNOSIS — N1832 Chronic kidney disease, stage 3b: Secondary | ICD-10-CM | POA: Diagnosis not present

## 2021-09-04 DIAGNOSIS — F0280 Dementia in other diseases classified elsewhere without behavioral disturbance: Secondary | ICD-10-CM | POA: Diagnosis not present

## 2021-09-04 NOTE — Telephone Encounter (Signed)
Victoria Sims with [facility] called stating pt was in [facility] for Cellulitis. Wants to make sure it is okay with PCP for them to see pt. Asked to receive a call back when available.  ? ?Victoria Sims w/ [facility] [phone] ?

## 2021-09-04 NOTE — Telephone Encounter (Signed)
Please advise 

## 2021-09-05 ENCOUNTER — Inpatient Hospital Stay: Payer: Medicare Other | Admitting: Family Medicine

## 2021-09-05 NOTE — Telephone Encounter (Signed)
Called tina no answer left vm  ?

## 2021-09-05 NOTE — Telephone Encounter (Signed)
Called tina no answer left vm called pt and advised of whats going on pt verbalized understanding ?

## 2021-09-06 ENCOUNTER — Telehealth: Payer: Self-pay | Admitting: Nurse Practitioner

## 2021-09-06 NOTE — Telephone Encounter (Signed)
Victoria Sims called back advised of [name]'s message tina states that pt is now non ambulatory she is needing a prescription for a hospital bed. Please advise  ?

## 2021-09-06 NOTE — Telephone Encounter (Signed)
Spoke with Victoria Sims ?

## 2021-09-06 NOTE — Telephone Encounter (Signed)
[name] w. [facility] called in to return call in regard to patient. ? ?Call back [phone] ?

## 2021-09-07 DIAGNOSIS — G309 Alzheimer's disease, unspecified: Secondary | ICD-10-CM | POA: Diagnosis not present

## 2021-09-07 DIAGNOSIS — J439 Emphysema, unspecified: Secondary | ICD-10-CM | POA: Diagnosis not present

## 2021-09-07 DIAGNOSIS — L03116 Cellulitis of left lower limb: Secondary | ICD-10-CM | POA: Diagnosis not present

## 2021-09-07 DIAGNOSIS — L03115 Cellulitis of right lower limb: Secondary | ICD-10-CM | POA: Diagnosis not present

## 2021-09-07 DIAGNOSIS — F0280 Dementia in other diseases classified elsewhere without behavioral disturbance: Secondary | ICD-10-CM | POA: Diagnosis not present

## 2021-09-07 DIAGNOSIS — I890 Lymphedema, not elsewhere classified: Secondary | ICD-10-CM | POA: Diagnosis not present

## 2021-09-07 NOTE — Telephone Encounter (Signed)
Victoria Sims has placed this order ?

## 2021-09-12 DIAGNOSIS — G309 Alzheimer's disease, unspecified: Secondary | ICD-10-CM | POA: Diagnosis not present

## 2021-09-12 DIAGNOSIS — J439 Emphysema, unspecified: Secondary | ICD-10-CM | POA: Diagnosis not present

## 2021-09-12 DIAGNOSIS — L03116 Cellulitis of left lower limb: Secondary | ICD-10-CM | POA: Diagnosis not present

## 2021-09-12 DIAGNOSIS — I890 Lymphedema, not elsewhere classified: Secondary | ICD-10-CM | POA: Diagnosis not present

## 2021-09-12 DIAGNOSIS — L03115 Cellulitis of right lower limb: Secondary | ICD-10-CM | POA: Diagnosis not present

## 2021-09-12 DIAGNOSIS — F0280 Dementia in other diseases classified elsewhere without behavioral disturbance: Secondary | ICD-10-CM | POA: Diagnosis not present

## 2021-09-14 DIAGNOSIS — L03116 Cellulitis of left lower limb: Secondary | ICD-10-CM | POA: Diagnosis not present

## 2021-09-14 DIAGNOSIS — F0280 Dementia in other diseases classified elsewhere without behavioral disturbance: Secondary | ICD-10-CM | POA: Diagnosis not present

## 2021-09-14 DIAGNOSIS — L03115 Cellulitis of right lower limb: Secondary | ICD-10-CM | POA: Diagnosis not present

## 2021-09-14 DIAGNOSIS — J439 Emphysema, unspecified: Secondary | ICD-10-CM | POA: Diagnosis not present

## 2021-09-14 DIAGNOSIS — G309 Alzheimer's disease, unspecified: Secondary | ICD-10-CM | POA: Diagnosis not present

## 2021-09-14 DIAGNOSIS — I890 Lymphedema, not elsewhere classified: Secondary | ICD-10-CM | POA: Diagnosis not present

## 2021-09-17 DIAGNOSIS — R69 Illness, unspecified: Secondary | ICD-10-CM | POA: Diagnosis not present

## 2021-09-19 DIAGNOSIS — I890 Lymphedema, not elsewhere classified: Secondary | ICD-10-CM | POA: Diagnosis not present

## 2021-09-19 DIAGNOSIS — J439 Emphysema, unspecified: Secondary | ICD-10-CM | POA: Diagnosis not present

## 2021-09-19 DIAGNOSIS — G309 Alzheimer's disease, unspecified: Secondary | ICD-10-CM | POA: Diagnosis not present

## 2021-09-19 DIAGNOSIS — F0280 Dementia in other diseases classified elsewhere without behavioral disturbance: Secondary | ICD-10-CM | POA: Diagnosis not present

## 2021-09-19 DIAGNOSIS — L03116 Cellulitis of left lower limb: Secondary | ICD-10-CM | POA: Diagnosis not present

## 2021-09-19 DIAGNOSIS — L03115 Cellulitis of right lower limb: Secondary | ICD-10-CM | POA: Diagnosis not present

## 2021-09-22 ENCOUNTER — Telehealth: Payer: Medicare Other | Admitting: Nurse Practitioner

## 2021-09-23 DIAGNOSIS — L03115 Cellulitis of right lower limb: Secondary | ICD-10-CM | POA: Diagnosis not present

## 2021-09-23 DIAGNOSIS — G309 Alzheimer's disease, unspecified: Secondary | ICD-10-CM | POA: Diagnosis not present

## 2021-09-23 DIAGNOSIS — J439 Emphysema, unspecified: Secondary | ICD-10-CM | POA: Diagnosis not present

## 2021-09-23 DIAGNOSIS — L03116 Cellulitis of left lower limb: Secondary | ICD-10-CM | POA: Diagnosis not present

## 2021-09-23 DIAGNOSIS — I890 Lymphedema, not elsewhere classified: Secondary | ICD-10-CM | POA: Diagnosis not present

## 2021-09-23 DIAGNOSIS — F0280 Dementia in other diseases classified elsewhere without behavioral disturbance: Secondary | ICD-10-CM | POA: Diagnosis not present

## 2021-09-25 ENCOUNTER — Telehealth: Payer: Self-pay | Admitting: Orthopaedic Surgery

## 2021-09-25 NOTE — Telephone Encounter (Signed)
Patient's husband, designated contact, called to request refill - said [facility] relayed to patient to call Dr Luna Glasgow for refill on 'hydroxyz' - uses [facility].  - please review and advise.

## 2021-09-27 NOTE — Telephone Encounter (Signed)
Awaiting call back from patient or husband per call back to patient yesterday, 09/26/21; said he would re-check the bottle and call back.

## 2021-09-28 ENCOUNTER — Telehealth: Payer: Self-pay

## 2021-09-28 ENCOUNTER — Telehealth: Payer: Self-pay | Admitting: Nurse Practitioner

## 2021-09-28 DIAGNOSIS — I890 Lymphedema, not elsewhere classified: Secondary | ICD-10-CM | POA: Diagnosis not present

## 2021-09-28 DIAGNOSIS — F0280 Dementia in other diseases classified elsewhere without behavioral disturbance: Secondary | ICD-10-CM | POA: Diagnosis not present

## 2021-09-28 DIAGNOSIS — G309 Alzheimer's disease, unspecified: Secondary | ICD-10-CM | POA: Diagnosis not present

## 2021-09-28 DIAGNOSIS — J439 Emphysema, unspecified: Secondary | ICD-10-CM | POA: Diagnosis not present

## 2021-09-28 DIAGNOSIS — L03116 Cellulitis of left lower limb: Secondary | ICD-10-CM | POA: Diagnosis not present

## 2021-09-28 DIAGNOSIS — L03115 Cellulitis of right lower limb: Secondary | ICD-10-CM | POA: Diagnosis not present

## 2021-09-28 NOTE — Telephone Encounter (Signed)
Victoria [facility]s [facility]. Need refill on Hydroxyzine 25 mg  1 tablet every eight hour  for itching prn.  Been on too long. If not working need to try Mirtazapine 15 mg at bed time.   Lexapro benn on it for 20 years and now having crying spells need something different.  Pharmacy: [facility]  Call back # for Victoria Sims [phone]

## 2021-09-28 NOTE — Telephone Encounter (Deleted)
Call was disconnected

## 2021-09-28 NOTE — Telephone Encounter (Signed)
error 

## 2021-09-28 NOTE — Telephone Encounter (Signed)
Please advise 

## 2021-09-29 ENCOUNTER — Ambulatory Visit (INDEPENDENT_AMBULATORY_CARE_PROVIDER_SITE_OTHER): Payer: Medicare Other | Admitting: Nurse Practitioner

## 2021-09-29 ENCOUNTER — Encounter: Payer: Self-pay | Admitting: Nurse Practitioner

## 2021-09-29 DIAGNOSIS — F419 Anxiety disorder, unspecified: Secondary | ICD-10-CM

## 2021-09-29 MED ORDER — HYDROXYZINE PAMOATE 25 MG PO CAPS: 25.0000 mg | ORAL_CAPSULE | Freq: Three times a day (TID) | ORAL | 4 refills | Status: DC | PRN

## 2021-09-29 NOTE — Assessment & Plan Note (Addendum)
Hydroxyzine 25 mg every 8 hours as needed refilled for anxiety Continue Lexapro 40 mg daily.

## 2021-09-29 NOTE — Progress Notes (Signed)
Virtual Visit via Telephone Note  I connected with Victoria Sims , husband of Victoria Sims @ on 09/29/21 at 1012am by telephone and verified that I am speaking with the correct person using two identifiers. I spent 6 minutes on the telephone encounter. History provided by the patient's husband Victoria Sims.   Location: Patient: home Provider: office   I discussed the limitations, risks, security and privacy concerns of performing an evaluation and management service by telephone and the availability of in person appointments. I also discussed with the patient that there may be a patient responsible charge related to this service. The patient expressed understanding and agreed to proceed.   History of Present Illness: Anxiety . pt's husband stated that the patient has been more confused lately, having crying spells, she has been taking hydroxyzine '25mg'$  once or twice daily as needed for anxiety which helps her anxiety. Currently on lexapro '40mg'$  daily for depression .they stated that hydroxyzine was been prescribed by another provider. They are requesting for a refill of hydroxyzine today .    Observations/Objective:   Assessment and Plan: Anxiety Hydroxyzine 25 mg every 8 hours as needed refilled for anxiety Continue Lexapro 40 mg daily.    Follow Up Instructions:    I discussed the assessment and treatment plan with the patient. The patient was provided an opportunity to ask questions and all were answered. The patient agreed with the plan and demonstrated an understanding of the instructions.   The patient was advised to call back or seek an in-person evaluation if the symptoms worsen or if the condition fails to improve as anticipated.

## 2021-09-29 NOTE — Telephone Encounter (Signed)
Spoke with tina pt's nurse she states that the hydroxyzine is working for the pt but they need refills due to it being from another provider. Scheduled tele visit for today

## 2021-10-04 DIAGNOSIS — Z7401 Bed confinement status: Secondary | ICD-10-CM | POA: Diagnosis not present

## 2021-10-04 DIAGNOSIS — Z7982 Long term (current) use of aspirin: Secondary | ICD-10-CM | POA: Diagnosis not present

## 2021-10-04 DIAGNOSIS — I890 Lymphedema, not elsewhere classified: Secondary | ICD-10-CM | POA: Diagnosis not present

## 2021-10-04 DIAGNOSIS — E039 Hypothyroidism, unspecified: Secondary | ICD-10-CM | POA: Diagnosis not present

## 2021-10-04 DIAGNOSIS — G309 Alzheimer's disease, unspecified: Secondary | ICD-10-CM | POA: Diagnosis not present

## 2021-10-04 DIAGNOSIS — N1832 Chronic kidney disease, stage 3b: Secondary | ICD-10-CM | POA: Diagnosis not present

## 2021-10-04 DIAGNOSIS — F0280 Dementia in other diseases classified elsewhere without behavioral disturbance: Secondary | ICD-10-CM | POA: Diagnosis not present

## 2021-10-04 DIAGNOSIS — L03115 Cellulitis of right lower limb: Secondary | ICD-10-CM | POA: Diagnosis not present

## 2021-10-04 DIAGNOSIS — J439 Emphysema, unspecified: Secondary | ICD-10-CM | POA: Diagnosis not present

## 2021-10-04 DIAGNOSIS — L03116 Cellulitis of left lower limb: Secondary | ICD-10-CM | POA: Diagnosis not present

## 2021-10-04 DIAGNOSIS — R911 Solitary pulmonary nodule: Secondary | ICD-10-CM | POA: Diagnosis not present

## 2021-10-04 DIAGNOSIS — E876 Hypokalemia: Secondary | ICD-10-CM | POA: Diagnosis not present

## 2021-10-04 DIAGNOSIS — F17210 Nicotine dependence, cigarettes, uncomplicated: Secondary | ICD-10-CM | POA: Diagnosis not present

## 2021-10-08 ENCOUNTER — Other Ambulatory Visit: Payer: Self-pay | Admitting: Internal Medicine

## 2021-10-09 ENCOUNTER — Telehealth: Payer: Self-pay | Admitting: Nurse Practitioner

## 2021-10-09 NOTE — Telephone Encounter (Signed)
Please advise 

## 2021-10-09 NOTE — Telephone Encounter (Signed)
Colletta [place] with [facility] called stating that the pt is needing palative care &  social worker. She is wanting to know if this can be ordered? If so, can you please call her so that she is able to put in the orders?    Lavone Neri nurse with [facility], call back # [phone]

## 2021-10-10 ENCOUNTER — Other Ambulatory Visit: Payer: Medicare Other

## 2021-10-10 ENCOUNTER — Telehealth: Payer: Self-pay

## 2021-10-10 DIAGNOSIS — Z515 Encounter for palliative care: Secondary | ICD-10-CM

## 2021-10-10 NOTE — Telephone Encounter (Signed)
See previous note

## 2021-10-10 NOTE — Progress Notes (Signed)
COMMUNITY PALLIATIVE CARE SW NOTE  PATIENT NAME: SYMPHONY DEMURO DOB: 1940/10/19 MRN: [id number]  PRIMARY CARE PROVIDER: Renee Rival, FNP  RESPONSIBLE PARTY:  Acct ID - Guarantor Home Phone Work Phone Relationship Acct Type  [id number] Barbette [phone]  Self P/F     [place], [place], [place] [address]   SOCIAL WORK TELEPHONIC ENCOUNTER  PC SW completed a telephonic encounter with patient's husband-Wade. Alveta Heimlich advised that patient need more assistance with ADL's as she is having more confusion. Alveta Heimlich states that currently he is patient's primary caregiver and does everything for her. SW discussed patient's insurance coverage (Medicare & Medicaid) and the services available for in-home care. SW offered to completed a PCS application or provide resources for paid in-home care. Alveta Heimlich stated he was working with Colletta Maryland and Otila Kluver with [facility] and they were working on getting a Education officer, museum. SW reiterated to him that she was Education officer, museum and could assist them with resources. Alveta Heimlich advised that he wanted to talk with Colletta Maryland and Otila Kluver and will get back to SW. SW will also with reach out to [place] or [place] at [place] to clarify identified needs.  No other concerns noted.   [address] [place], [place]

## 2021-10-10 NOTE — Telephone Encounter (Signed)
Called Colletta Maryland to give ok to put in orders no answer left vm

## 2021-10-11 DIAGNOSIS — I890 Lymphedema, not elsewhere classified: Secondary | ICD-10-CM | POA: Diagnosis not present

## 2021-10-11 DIAGNOSIS — J439 Emphysema, unspecified: Secondary | ICD-10-CM | POA: Diagnosis not present

## 2021-10-11 DIAGNOSIS — L03115 Cellulitis of right lower limb: Secondary | ICD-10-CM | POA: Diagnosis not present

## 2021-10-11 DIAGNOSIS — L03116 Cellulitis of left lower limb: Secondary | ICD-10-CM | POA: Diagnosis not present

## 2021-10-11 DIAGNOSIS — G309 Alzheimer's disease, unspecified: Secondary | ICD-10-CM | POA: Diagnosis not present

## 2021-10-11 DIAGNOSIS — F0280 Dementia in other diseases classified elsewhere without behavioral disturbance: Secondary | ICD-10-CM | POA: Diagnosis not present

## 2021-10-11 NOTE — Telephone Encounter (Signed)
Spoke with stephanie gave ok to put in orders

## 2021-10-16 ENCOUNTER — Telehealth: Payer: Self-pay | Admitting: Nurse Practitioner

## 2021-10-16 DIAGNOSIS — I890 Lymphedema, not elsewhere classified: Secondary | ICD-10-CM | POA: Diagnosis not present

## 2021-10-16 DIAGNOSIS — L03116 Cellulitis of left lower limb: Secondary | ICD-10-CM | POA: Diagnosis not present

## 2021-10-16 DIAGNOSIS — L03115 Cellulitis of right lower limb: Secondary | ICD-10-CM | POA: Diagnosis not present

## 2021-10-16 DIAGNOSIS — J439 Emphysema, unspecified: Secondary | ICD-10-CM | POA: Diagnosis not present

## 2021-10-16 DIAGNOSIS — G309 Alzheimer's disease, unspecified: Secondary | ICD-10-CM | POA: Diagnosis not present

## 2021-10-16 DIAGNOSIS — F0280 Dementia in other diseases classified elsewhere without behavioral disturbance: Secondary | ICD-10-CM | POA: Diagnosis not present

## 2021-10-16 NOTE — Telephone Encounter (Signed)
Please advise 

## 2021-10-16 NOTE — Telephone Encounter (Signed)
Victoria Sims with [facility], states looks like abscess on rt elbow. States it red, swollen, warm & draining thick white stuff. Wants to know what can be done? Is hard to get pt to office to be seen.    [address]      [place] with [facility], [phone]

## 2021-10-17 NOTE — Telephone Encounter (Signed)
Called stephanie no answer left vm

## 2021-10-17 NOTE — Telephone Encounter (Signed)
Spoke with wade pt's husband scheduled tele for tomorrow

## 2021-10-18 ENCOUNTER — Encounter: Payer: Self-pay | Admitting: Nurse Practitioner

## 2021-10-18 ENCOUNTER — Ambulatory Visit (INDEPENDENT_AMBULATORY_CARE_PROVIDER_SITE_OTHER): Payer: Medicare Other | Admitting: Nurse Practitioner

## 2021-10-18 DIAGNOSIS — L0390 Cellulitis, unspecified: Secondary | ICD-10-CM | POA: Insufficient documentation

## 2021-10-18 MED ORDER — DOXYCYCLINE HYCLATE 100 MG PO TABS: 100.0000 mg | ORAL_TABLET | Freq: Two times a day (BID) | ORAL | 0 refills | Status: DC

## 2021-10-18 NOTE — Assessment & Plan Note (Addendum)
Was treated with Antibiotics for cellulitis of right leg  2 months ago Due to recent skin infections and history of MRSA, Rx doxycycline 100 mg twice daily for 7 days Continue daily wound care dressing, keep site clean and dry

## 2021-10-18 NOTE — Progress Notes (Signed)
Virtual Visit via Telephone Note  I connected with Victoria Sims @ on 10/18/21 at 4:50PM by telephone and verified that I am speaking with the correct person using two identifiers.   Mr. Victoria Sims patient's husband gave history today.  I spent 8 minutes on this telephone encounter  Location: Patient: home  Provider: OFFICE   I discussed the limitations, risks, security and privacy concerns of performing an evaluation and management service by telephone and the availability of in person appointments. I also discussed with the patient that there may be a patient responsible charge related to this service. The patient expressed understanding and agreed to proceed.   History of Present Illness: Ms. Victoria Sims with past medical history of Alzheimer's dementia, Graves' disease, MRSA, hyperlipidemia, presents with complaints of abcess on right elbow, abcess was first noticed 3 days ago , has yellowish drainage, denies fever , chills , malaise.  Patient has not taken anything for her abscess, home health RN has been dressing the site.    Observations/Objective:   Assessment and Plan: Wound cellulitis Was treated with Antibiotics for cellulitis of right leg  2 months ago Due to recent skin infections and history of MRSA, Rx doxycycline 100 mg twice daily for 7 days Continue daily wound care dressing, keep site clean and dry   Follow Up Instructions:    I discussed the assessment and treatment plan with the patient. The patient was provided an opportunity to ask questions and all were answered. The patient agreed with the plan and demonstrated an understanding of the instructions.   The patient was advised to call back or seek an in-person evaluation if the symptoms worsen or if the condition fails to improve as anticipated.

## 2021-10-24 ENCOUNTER — Encounter: Payer: Self-pay | Admitting: Nurse Practitioner

## 2021-10-24 ENCOUNTER — Ambulatory Visit (INDEPENDENT_AMBULATORY_CARE_PROVIDER_SITE_OTHER): Payer: Medicare Other | Admitting: Nurse Practitioner

## 2021-10-24 ENCOUNTER — Ambulatory Visit (INDEPENDENT_AMBULATORY_CARE_PROVIDER_SITE_OTHER): Payer: Medicare Other | Admitting: *Deleted

## 2021-10-24 DIAGNOSIS — E039 Hypothyroidism, unspecified: Secondary | ICD-10-CM | POA: Diagnosis not present

## 2021-10-24 DIAGNOSIS — E785 Hyperlipidemia, unspecified: Secondary | ICD-10-CM | POA: Diagnosis not present

## 2021-10-24 DIAGNOSIS — Z515 Encounter for palliative care: Secondary | ICD-10-CM | POA: Diagnosis not present

## 2021-10-24 DIAGNOSIS — L0390 Cellulitis, unspecified: Secondary | ICD-10-CM | POA: Diagnosis not present

## 2021-10-24 DIAGNOSIS — Z0000 Encounter for general adult medical examination without abnormal findings: Secondary | ICD-10-CM | POA: Diagnosis not present

## 2021-10-24 DIAGNOSIS — D649 Anemia, unspecified: Secondary | ICD-10-CM | POA: Insufficient documentation

## 2021-10-24 NOTE — Progress Notes (Signed)
Subjective:   Victoria Sims is a 81 y.o. female who presents for Medicare Annual (Subsequent) preventive examination.  I connected with  Victoria Sims on 10/24/21 by a audio enabled telemedicine application and verified that I am speaking with the correct person using two identifiers.  Patient Location: Home  Provider Location: Office/Clinic  I discussed the limitations of evaluation and management by telemedicine. The patient expressed understanding and agreed to proceed.  Review of Systems     Ms. Victoria Sims , Thank you for taking time to come for your Medicare Wellness Visit. I appreciate your ongoing commitment to your health goals. Please review the following plan we discussed and let me know if I can assist you in the future.   These are the goals we discussed:  Goals   None     This is a list of the screening recommended for you and due dates:  Health Maintenance  Topic Date Due   Pneumonia Vaccine (1 - PCV) Never done   Hepatitis C Screening: USPSTF Recommendation to screen - Ages 41-79 yo.  Never done   Tetanus Vaccine  Never done   Zoster (Shingles) Vaccine (1 of 2) Never done   COVID-19 Vaccine (1) 11/03/2021*   Flu Shot  12/05/2021   DEXA scan (bone density measurement)  Completed   HPV Vaccine  Aged Out  *Topic was postponed. The date shown is not the original due date.          Objective:    There were no vitals filed for this visit. There is no height or weight on file to calculate BMI.     08/16/2021   12:17 PM 05/19/2021   10:43 AM 09/29/2011    2:00 PM 09/26/2011    8:02 PM  Advanced Directives  Does Patient Have a Medical Advance Directive? No Yes Patient does not have advance directive;Patient would not like information Patient does not have advance directive;Patient would not like information  Copy of Ramos in Chart?  No - copy requested    Pre-existing out of facility DNR order (yellow form or pink MOST form)   No  No    Current Medications (verified) Outpatient Encounter Medications as of 10/24/2021  Medication Sig   aspirin 81 MG EC tablet Take 81 mg by mouth daily.   Cholecalciferol (VITAMIN D-1000 MAX ST) 25 MCG (1000 UT) tablet Take by mouth.   diclofenac Sodium (VOLTAREN) 1 % GEL Apply 4 g topically at bedtime.   Docusate Sodium (DSS) 100 MG CAPS Take by mouth.   doxycycline (VIBRA-TABS) 100 MG tablet Take 1 tablet (100 mg total) by mouth 2 (two) times daily.   escitalopram (LEXAPRO) 20 MG tablet Take 40 mg by mouth daily.   ezetimibe (ZETIA) 10 MG tablet TAKE 1 TABLET BY MOUTH DAILY   furosemide (LASIX) 20 MG tablet TAKE 1 TABLET BY MOUTH EVERY DAY AS NEEDED FOR SWELLING NEED TO SCHEDULE VISIT WITH DR HILTY   hydrOXYzine (VISTARIL) 25 MG capsule Take 1 capsule (25 mg total) by mouth every 8 (eight) hours as needed.   levothyroxine (SYNTHROID) 125 MCG tablet Take 125 mcg by mouth daily.   levothyroxine (SYNTHROID, LEVOTHROID) 88 MCG tablet Take 88 mcg by mouth daily. (Patient not taking: Reported on 09/29/2021)   mirabegron ER (MYRBETRIQ) 50 MG TB24 tablet Take 1 tablet (50 mg total) by mouth daily.   mirtazapine (REMERON) 15 MG tablet TAKE 1 TABLET BY MOUTH AT BEDTIME   pantoprazole (PROTONIX)  40 MG tablet Take by mouth.   potassium chloride SA (KLOR-CON M) 20 MEQ tablet TAKE 1 TABLET BY MOUTH ONCE A DAY WITH FOOD (Patient not taking: Reported on 10/18/2021)   rosuvastatin (CRESTOR) 20 MG tablet TAKE 1 TABLET BY MOUTH EVERY DAY   No facility-administered encounter medications on file as of 10/24/2021.    Allergies (verified) Atorvastatin, Gabapentin, Macrobid [nitrofurantoin macrocrystal], and Sulfa antibiotics   History: Past Medical History:  Diagnosis Date   Alzheimer's dementia ([place])    Breast cancer ([place]) 2006   right   Cancer ([place]) 07/2005   right breast/ER/PR positive   Cataract    and dry eye/spots on retina   Cataract    surgery on right eye 04/12/14   CKD (chronic kidney  disease) stage 3, GFR 30-59 ml/min ([place])    Condyloma 01/1998   Depression    Grave's disease    Macular degeneration of both eyes    Microhematuria 07/1992   negative IVP, [place]   MRSA (methicillin resistant Staphylococcus aureus)    Personal history of radiation therapy    Shingles    Past Surgical History:  Procedure Laterality Date   ABDOMINAL HYSTERECTOMY     BREAST BIOPSY Bilateral 1990   BREAST LUMPECTOMY Right    CATARACT EXTRACTION  04/12/14   right eye   CATARACT EXTRACTION  2/16   left eye   CHOLECYSTECTOMY     EYE SURGERY     goiter  7/93   radioactive iodine   INGUINAL HERNIA REPAIR  09/29/2011   Procedure: HERNIA REPAIR INGUINAL ADULT;  Surgeon: Rolm Bookbinder, MD;  Location: [facility];  Service: General;  Laterality: Right;   TUBAL LIGATION     D&C   Family History  Problem Relation Age of Onset   Hypertension Mother    Heart disease Mother    Breast cancer Neg Hx    Social History   Socioeconomic History   Marital status: Married    Spouse name: Not on file   Number of children: Not on file   Years of education: Not on file   Highest education level: Not on file  Occupational History   Not on file  Tobacco Use   Smoking status: Former    Packs/day: 0.50    Years: 54.00    Total pack years: 27.00    Types: Cigarettes    Quit date: 09/2021    Years since quitting: 0.1   Smokeless tobacco: Never  Vaping Use   Vaping Use: Never used  Substance and Sexual Activity   Alcohol use: No    Alcohol/week: 0.0 standard drinks of alcohol   Drug use: No   Sexual activity: Yes    Partners: Male    Birth control/protection: Surgical    Comment: hysterectomy  Other Topics Concern   Not on file  Social History Narrative   Lives with her husband    Social Determinants of Health   Financial Resource Strain: Not on file  Food Insecurity: Not on file  Transportation Needs: Not on file  Physical Activity: Not on file  Stress: Not on file  Social  Connections: Not on file    Tobacco Counseling Counseling given: Not Answered   Clinical Intake:                 Diabetic?no         Activities of Daily Living     No data to display  Patient Care Team: Renee Rival, FNP as PCP - General (Nurse Practitioner) Pixie Casino, MD as PCP - Cardiology (Cardiology)  Indicate any recent Medical Services you may have received from other than [facility] providers in the past year (date may be approximate).     Assessment:   This is a routine wellness examination for Gradie.  Hearing/Vision screen No results found.  Dietary issues and exercise activities discussed:     Goals Addressed   None   Depression Screen    10/18/2021    3:39 PM 09/29/2021    9:35 AM 07/20/2021    2:41 PM  PHQ 2/9 Scores  PHQ - 2 Score 0 3 0    Fall Risk    10/18/2021    3:38 PM 09/29/2021    9:35 AM 07/20/2021    2:41 PM  Fall Risk   Falls in the past year? 0 0 0  Number falls in past yr: 0 0 0  Injury with Fall? 0 0 0  Risk for fall due to : No Fall Risks No Fall Risks No Fall Risks  Follow up Falls evaluation completed Falls evaluation completed Falls evaluation completed    [place]:  Any stairs in or around the home? No  If so, are there any without handrails? No  Home free of loose throw rugs in walkways, pet beds, electrical cords, etc? Yes  Adequate lighting in your home to reduce risk of falls? Yes   ASSISTIVE DEVICES UTILIZED TO PREVENT FALLS:  Life alert? No  Use of a cane, walker or w/c? No  Grab bars in the bathroom? No  Shower chair or bench in shower? No  Elevated toilet seat or a handicapped toilet? No   Cognitive Function:    12/10/2019    7:40 AM  MMSE - Mini Mental State Exam  Orientation to time 2  Orientation to Place 3  Registration 3  Attention/ Calculation 5  Recall 2  Language- name 2 objects 2  Language- repeat 0  Language- follow 3  step command 2  Language- read & follow direction 1  Write a sentence 1  Copy design 0  Total score 21        Immunizations There is no immunization history for the selected administration types on file for this patient.  TDAP status: Due, Education has been provided regarding the importance of this vaccine. Advised may receive this vaccine at local pharmacy or Health Dept. Aware to provide a copy of the vaccination record if obtained from local pharmacy or Health Dept. Verbalized acceptance and understanding.  Flu Vaccine status: Up to date  Pneumococcal vaccine status: Due, Education has been provided regarding the importance of this vaccine. Advised may receive this vaccine at local pharmacy or Health Dept. Aware to provide a copy of the vaccination record if obtained from local pharmacy or Health Dept. Verbalized acceptance and understanding.  Covid-19 vaccine status: Completed vaccines  Qualifies for Shingles Vaccine? Yes   Zostavax completed No   Shingrix Completed?: No.    Education has been provided regarding the importance of this vaccine. Patient has been advised to call insurance company to determine out of pocket expense if they have not yet received this vaccine. Advised may also receive vaccine at local pharmacy or Health Dept. Verbalized acceptance and understanding.  Screening Tests Health Maintenance  Topic Date Due   Pneumonia Vaccine 69+ Years old (1 - PCV) Never done   Hepatitis C  Screening  Never done   TETANUS/TDAP  Never done   Zoster Vaccines- Shingrix (1 of 2) Never done   COVID-19 Vaccine (1) 11/03/2021 (Originally 08/18/1941)   INFLUENZA VACCINE  12/05/2021   DEXA SCAN  Completed   HPV VACCINES  Aged Out    Health Maintenance  Health Maintenance Due  Topic Date Due   Pneumonia Vaccine 53+ Years old (1 - PCV) Never done   Hepatitis C Screening  Never done   TETANUS/TDAP  Never done   Zoster Vaccines- Shingrix (1 of 2) Never done    Colorectal  cancer screening: No longer required.   Mammogram status: No longer required due to age.  Bone Density status: Completed 2009. Results reflect: Bone density results: OSTEOPOROSIS. Repeat every 3 years.  Lung Cancer Screening: (Low Dose CT Chest recommended if Age 33-80 years, 30 pack-year currently smoking OR have quit w/in 15years.) does not qualify.   Lung Cancer Screening Referral: NA  Additional Screening:  Hepatitis C Screening: does qualify; Completed   Vision Screening: Recommended annual ophthalmology exams for early detection of glaucoma and other disorders of the eye. Is the patient up to date with their annual eye exam?  No  Who is the provider or what is the name of the office in which the patient attends annual eye exams? Patient is bed ridden If pt is not established with a provider, would they like to be referred to a provider to establish care? No .   Dental Screening: Recommended annual dental exams for proper oral hygiene  Community Resource Referral / Chronic Care Management: CRR required this visit?  No   CCM required this visit?  No      Plan:     I have personally reviewed and noted the following in the patient's chart:   Medical and social history Use of alcohol, tobacco or illicit drugs  Current medications and supplements including opioid prescriptions.  Functional ability and status Nutritional status Physical activity Advanced directives List of other physicians Hospitalizations, surgeries, and ER visits in previous 12 months Vitals Screenings to include cognitive, depression, and falls Referrals and appointments  In addition, I have reviewed and discussed with patient certain preventive protocols, quality metrics, and best practice recommendations. A written personalized care plan for preventive services as well as general preventive health recommendations were provided to patient.     Shelda Altes, CMA   10/24/2021   Nurse Notes:  Ms.  Weilbacher , Thank you for taking time to come for your Medicare Wellness Visit. I appreciate your ongoing commitment to your health goals. Please review the following plan we discussed and let me know if I can assist you in the future.   These are the goals we discussed:  Goals   None     This is a list of the screening recommended for you and due dates:  Health Maintenance  Topic Date Due   Pneumonia Vaccine (1 - PCV) Never done   Hepatitis C Screening: USPSTF Recommendation to screen - Ages 3-79 yo.  Never done   Tetanus Vaccine  Never done   Zoster (Shingles) Vaccine (1 of 2) Never done   COVID-19 Vaccine (1) 11/03/2021*   Flu Shot  12/05/2021   DEXA scan (bone density measurement)  Completed   HPV Vaccine  Aged Out  *Topic was postponed. The date shown is not the original due date.

## 2021-10-24 NOTE — Assessment & Plan Note (Signed)
On rosuvastatin 20 mg daily Check lipid panel Avoid fried fatty foods

## 2021-10-24 NOTE — Assessment & Plan Note (Addendum)
Anemia noted on mos recent labs  HGB 10.3 MCH 25.9 MCHC 30.1 Check iron panel

## 2021-10-24 NOTE — Assessment & Plan Note (Signed)
Continues to receive care from palliative care team. Appreciate collaboration with palliative care team. Will Check with palliative care team  to see if labs can be drawn by them

## 2021-10-24 NOTE — Assessment & Plan Note (Signed)
Currently on levothyroxine 100 mcg, 1 tablet daily Check TSH

## 2021-10-24 NOTE — Assessment & Plan Note (Addendum)
Patient husband confirmed that her right elbow wound is healing well Need to complete current course of doxycycline 100 mg twice daily daily discussed

## 2021-10-24 NOTE — Patient Instructions (Signed)
Victoria Sims , Thank you for taking time to come for your Medicare Wellness Visit. I appreciate your ongoing commitment to your health goals. Please review the following plan we discussed and let me know if I can assist you in the future.   Screening recommendations/referrals: Recommended yearly ophthalmology/optometry visit for glaucoma screening and checkup Recommended yearly dental visit for hygiene and checkup  Vaccinations: Influenza vaccine: completed Pneumococcal vaccine: due now Tdap vaccine: due now Shingles vaccine: due now    Advanced directives: declined information  Conditions/risks identified: Falls  Next appointment: 1 year    Preventive Care 11 Years and Older, Female Preventive care refers to lifestyle choices and visits with your health care provider that can promote health and wellness. What does preventive care include? A yearly physical exam. This is also called an annual well check. Dental exams once or twice a year. Routine eye exams. Ask your health care provider how often you should have your eyes checked. Personal lifestyle choices, including: Daily care of your teeth and gums. Regular physical activity. Eating a healthy diet. Avoiding tobacco and drug use. Limiting alcohol use. Practicing safe sex. Taking low-dose aspirin every day. Taking vitamin and mineral supplements as recommended by your health care provider. What happens during an annual well check? The services and screenings done by your health care provider during your annual well check will depend on your age, overall health, lifestyle risk factors, and family history of disease. Counseling  Your health care provider may ask you questions about your: Alcohol use. Tobacco use. Drug use. Emotional well-being. Home and relationship well-being. Sexual activity. Eating habits. History of falls. Memory and ability to understand (cognition). Work and work Statistician. Reproductive  health. Screening  You may have the following tests or measurements: Height, weight, and BMI. Blood pressure. Lipid and cholesterol levels. These may be checked every 5 years, or more frequently if you are over 42 years old. Skin check. Lung cancer screening. You may have this screening every year starting at age 51 if you have a 30-pack-year history of smoking and currently smoke or have quit within the past 15 years. Fecal occult blood test (FOBT) of the stool. You may have this test every year starting at age 30. Flexible sigmoidoscopy or colonoscopy. You may have a sigmoidoscopy every 5 years or a colonoscopy every 10 years starting at age 9. Hepatitis C blood test. Hepatitis B blood test. Sexually transmitted disease (STD) testing. Diabetes screening. This is done by checking your blood sugar (glucose) after you have not eaten for a while (fasting). You may have this done every 1-3 years. Bone density scan. This is done to screen for osteoporosis. You may have this done starting at age 38. Mammogram. This may be done every 1-2 years. Talk to your health care provider about how often you should have regular mammograms. Talk with your health care provider about your test results, treatment options, and if necessary, the need for more tests. Vaccines  Your health care provider may recommend certain vaccines, such as: Influenza vaccine. This is recommended every year. Tetanus, diphtheria, and acellular pertussis (Tdap, Td) vaccine. You may need a Td booster every 10 years. Zoster vaccine. You may need this after age 36. Pneumococcal 13-valent conjugate (PCV13) vaccine. One dose is recommended after age 59. Pneumococcal polysaccharide (PPSV23) vaccine. One dose is recommended after age 35. Talk to your health care provider about which screenings and vaccines you need and how often you need them. This information is not intended  to replace advice given to you by your health care provider.  Make sure you discuss any questions you have with your health care provider. Document Released: 05/20/2015 Document Revised: 01/11/2016 Document Reviewed: 02/22/2015 Elsevier Interactive Patient Education  2017 [place] Prevention in the Home Falls can cause injuries. They can happen to people of all ages. There are many things you can do to make your home safe and to help prevent falls. What can I do on the outside of my home? Regularly fix the edges of walkways and driveways and fix any cracks. Remove anything that might make you trip as you walk through a door, such as a raised step or threshold. Trim any bushes or trees on the path to your home. Use bright outdoor lighting. Clear any walking paths of anything that might make someone trip, such as rocks or tools. Regularly check to see if handrails are loose or broken. Make sure that both sides of any steps have handrails. Any raised decks and porches should have guardrails on the edges. Have any leaves, snow, or ice cleared regularly. Use sand or salt on walking paths during winter. Clean up any spills in your garage right away. This includes oil or grease spills. What can I do in the bathroom? Use night lights. Install grab bars by the toilet and in the tub and shower. Do not use towel bars as grab bars. Use non-skid mats or decals in the tub or shower. If you need to sit down in the shower, use a plastic, non-slip stool. Keep the floor dry. Clean up any water that spills on the floor as soon as it happens. Remove soap buildup in the tub or shower regularly. Attach bath mats securely with double-sided non-slip rug tape. Do not have throw rugs and other things on the floor that can make you trip. What can I do in the bedroom? Use night lights. Make sure that you have a light by your bed that is easy to reach. Do not use any sheets or blankets that are too big for your bed. They should not hang down onto the floor. Have a  firm chair that has side arms. You can use this for support while you get dressed. Do not have throw rugs and other things on the floor that can make you trip. What can I do in the kitchen? Clean up any spills right away. Avoid walking on wet floors. Keep items that you use a lot in easy-to-reach places. If you need to reach something above you, use a strong step stool that has a grab bar. Keep electrical cords out of the way. Do not use floor polish or wax that makes floors slippery. If you must use wax, use non-skid floor wax. Do not have throw rugs and other things on the floor that can make you trip. What can I do with my stairs? Do not leave any items on the stairs. Make sure that there are handrails on both sides of the stairs and use them. Fix handrails that are broken or loose. Make sure that handrails are as long as the stairways. Check any carpeting to make sure that it is firmly attached to the stairs. Fix any carpet that is loose or worn. Avoid having throw rugs at the top or bottom of the stairs. If you do have throw rugs, attach them to the floor with carpet tape. Make sure that you have a light switch at the top of the stairs and  the bottom of the stairs. If you do not have them, ask someone to add them for you. What else can I do to help prevent falls? Wear shoes that: Do not have high heels. Have rubber bottoms. Are comfortable and fit you well. Are closed at the toe. Do not wear sandals. If you use a stepladder: Make sure that it is fully opened. Do not climb a closed stepladder. Make sure that both sides of the stepladder are locked into place. Ask someone to hold it for you, if possible. Clearly mark and make sure that you can see: Any grab bars or handrails. First and last steps. Where the edge of each step is. Use tools that help you move around (mobility aids) if they are needed. These include: Canes. Walkers. Scooters. Crutches. Turn on the lights when you  go into a dark area. Replace any light bulbs as soon as they burn out. Set up your furniture so you have a clear path. Avoid moving your furniture around. If any of your floors are uneven, fix them. If there are any pets around you, be aware of where they are. Review your medicines with your doctor. Some medicines can make you feel dizzy. This can increase your chance of falling. Ask your doctor what other things that you can do to help prevent falls. This information is not intended to replace advice given to you by your health care provider. Make sure you discuss any questions you have with your health care provider. Document Released: 02/17/2009 Document Revised: 09/29/2015 Document Reviewed: 05/28/2014 Elsevier Interactive Patient Education  2017 [employer].

## 2021-10-24 NOTE — Progress Notes (Signed)
Virtual Visit via Telephone Note  I connected with Victoria Sims @ on 10/24/21 at  2:20 PM EDT by telephone and verified that I am speaking with the correct person using two identifiers.  [name] , patient's husband assisted with giving history today.  I spent 12 minutes on this telephone encounter  Location: Patient: home Provider: office   I discussed the limitations, risks, security and privacy concerns of performing an evaluation and management service by telephone and the availability of in person appointments. I also discussed with the patient that there may be a patient responsible charge related to this service. The patient expressed understanding and agreed to proceed.   History of Present Illness: Victoria Sims with past medical history of hypertension, hypothyroidism, hyperlipidemia, memory loss, anxiety presents for follow-up of her chronic medical conditions .  Patient is currently bedbound  at home,since the past 2 months she is being followed by palliative care at home palliative care nurse visits twice a week, checking vital signs.     Patient's husband stated that wound on the elbow is healing well, still taking doxycycline 100 mg twice daily.  No complaints of fever, chills.  Hypothyroidism.  Currently taking levothyroxine 100 mcg tablet once daily.  Anxiety.  Currently on Lexapro 40 mg daily, hydroxyzine 25 mg 3 times daily.  Patient remains confused, her husband states that she has intermittent anxiety.  Hyperlipidemia.  Currently on rosuvastatin 20 mg daily.    Observations/Objective:   Assessment and Plan: Hypothyroidism Currently on levothyroxine 100 mcg, 1 tablet daily Check TSH  Anemia Anemia noted on mos recent labs  HGB 10.3 MCH 25.9 MCHC 30.1 Check iron panel  Dyslipidemia On rosuvastatin 20 mg daily Check lipid panel Avoid fried fatty foods  Wound cellulitis Patient husband confirmed that her right elbow wound is healing well Need to  complete current course of doxycycline 100 mg twice daily daily discussed  Palliative care encounter Continues to receive care from palliative care team. Appreciate collaboration with palliative care team. Will Check with palliative care team  to see if labs can be drawn by them    Follow Up Instructions:    I discussed the assessment and treatment plan with the patient. The patient was provided an opportunity to ask questions and all were answered. The patient agreed with the plan and demonstrated an understanding of the instructions.   The patient was advised to call back or seek an in-person evaluation if the symptoms worsen or if the condition fails to improve as anticipated.

## 2021-10-25 DIAGNOSIS — I890 Lymphedema, not elsewhere classified: Secondary | ICD-10-CM | POA: Diagnosis not present

## 2021-10-25 DIAGNOSIS — L03115 Cellulitis of right lower limb: Secondary | ICD-10-CM | POA: Diagnosis not present

## 2021-10-25 DIAGNOSIS — J439 Emphysema, unspecified: Secondary | ICD-10-CM | POA: Diagnosis not present

## 2021-10-25 DIAGNOSIS — G309 Alzheimer's disease, unspecified: Secondary | ICD-10-CM | POA: Diagnosis not present

## 2021-10-25 DIAGNOSIS — F0280 Dementia in other diseases classified elsewhere without behavioral disturbance: Secondary | ICD-10-CM | POA: Diagnosis not present

## 2021-10-25 DIAGNOSIS — L03116 Cellulitis of left lower limb: Secondary | ICD-10-CM | POA: Diagnosis not present

## 2021-11-01 DIAGNOSIS — L03115 Cellulitis of right lower limb: Secondary | ICD-10-CM | POA: Diagnosis not present

## 2021-11-01 DIAGNOSIS — G309 Alzheimer's disease, unspecified: Secondary | ICD-10-CM | POA: Diagnosis not present

## 2021-11-01 DIAGNOSIS — I890 Lymphedema, not elsewhere classified: Secondary | ICD-10-CM | POA: Diagnosis not present

## 2021-11-01 DIAGNOSIS — F0280 Dementia in other diseases classified elsewhere without behavioral disturbance: Secondary | ICD-10-CM | POA: Diagnosis not present

## 2021-11-01 DIAGNOSIS — J439 Emphysema, unspecified: Secondary | ICD-10-CM | POA: Diagnosis not present

## 2021-11-01 DIAGNOSIS — L03116 Cellulitis of left lower limb: Secondary | ICD-10-CM | POA: Diagnosis not present

## 2021-11-02 DIAGNOSIS — L03116 Cellulitis of left lower limb: Secondary | ICD-10-CM | POA: Diagnosis not present

## 2021-11-02 DIAGNOSIS — L03115 Cellulitis of right lower limb: Secondary | ICD-10-CM | POA: Diagnosis not present

## 2021-11-02 DIAGNOSIS — I890 Lymphedema, not elsewhere classified: Secondary | ICD-10-CM | POA: Diagnosis not present

## 2021-11-02 DIAGNOSIS — J439 Emphysema, unspecified: Secondary | ICD-10-CM | POA: Diagnosis not present

## 2021-11-02 DIAGNOSIS — G309 Alzheimer's disease, unspecified: Secondary | ICD-10-CM | POA: Diagnosis not present

## 2021-11-02 DIAGNOSIS — F0280 Dementia in other diseases classified elsewhere without behavioral disturbance: Secondary | ICD-10-CM | POA: Diagnosis not present

## 2021-11-03 ENCOUNTER — Telehealth: Payer: Self-pay

## 2021-11-03 DIAGNOSIS — L03116 Cellulitis of left lower limb: Secondary | ICD-10-CM | POA: Diagnosis not present

## 2021-11-03 DIAGNOSIS — E039 Hypothyroidism, unspecified: Secondary | ICD-10-CM | POA: Diagnosis not present

## 2021-11-03 DIAGNOSIS — J439 Emphysema, unspecified: Secondary | ICD-10-CM | POA: Diagnosis not present

## 2021-11-03 DIAGNOSIS — G309 Alzheimer's disease, unspecified: Secondary | ICD-10-CM | POA: Diagnosis not present

## 2021-11-03 DIAGNOSIS — Z7982 Long term (current) use of aspirin: Secondary | ICD-10-CM | POA: Diagnosis not present

## 2021-11-03 DIAGNOSIS — Z7401 Bed confinement status: Secondary | ICD-10-CM | POA: Diagnosis not present

## 2021-11-03 DIAGNOSIS — N1832 Chronic kidney disease, stage 3b: Secondary | ICD-10-CM | POA: Diagnosis not present

## 2021-11-03 DIAGNOSIS — F0280 Dementia in other diseases classified elsewhere without behavioral disturbance: Secondary | ICD-10-CM | POA: Diagnosis not present

## 2021-11-03 DIAGNOSIS — R911 Solitary pulmonary nodule: Secondary | ICD-10-CM | POA: Diagnosis not present

## 2021-11-03 DIAGNOSIS — L03115 Cellulitis of right lower limb: Secondary | ICD-10-CM | POA: Diagnosis not present

## 2021-11-03 DIAGNOSIS — E876 Hypokalemia: Secondary | ICD-10-CM | POA: Diagnosis not present

## 2021-11-03 DIAGNOSIS — I890 Lymphedema, not elsewhere classified: Secondary | ICD-10-CM | POA: Diagnosis not present

## 2021-11-03 DIAGNOSIS — F17210 Nicotine dependence, cigarettes, uncomplicated: Secondary | ICD-10-CM | POA: Diagnosis not present

## 2021-11-03 NOTE — Telephone Encounter (Signed)
Sandy from [facility] called and left a voicemail asked if nurse please return call at [phone] option 2.

## 2021-11-03 NOTE — Telephone Encounter (Signed)
Called no answer vm not set up

## 2021-11-03 NOTE — Telephone Encounter (Signed)
Sandi called back states that she needed an order for the pt to have a home health aide. Verbal given per Kristin Bruins. Sandi said that the pt no longer needs any social work orders at this time and that if something changes she will contact us.

## 2021-11-06 ENCOUNTER — Other Ambulatory Visit: Payer: Self-pay | Admitting: Nurse Practitioner

## 2021-11-06 DIAGNOSIS — L03115 Cellulitis of right lower limb: Secondary | ICD-10-CM | POA: Diagnosis not present

## 2021-11-06 DIAGNOSIS — G309 Alzheimer's disease, unspecified: Secondary | ICD-10-CM | POA: Diagnosis not present

## 2021-11-06 DIAGNOSIS — L03116 Cellulitis of left lower limb: Secondary | ICD-10-CM | POA: Diagnosis not present

## 2021-11-06 DIAGNOSIS — J439 Emphysema, unspecified: Secondary | ICD-10-CM | POA: Diagnosis not present

## 2021-11-06 DIAGNOSIS — F0280 Dementia in other diseases classified elsewhere without behavioral disturbance: Secondary | ICD-10-CM | POA: Diagnosis not present

## 2021-11-06 DIAGNOSIS — I890 Lymphedema, not elsewhere classified: Secondary | ICD-10-CM | POA: Diagnosis not present

## 2021-11-06 DIAGNOSIS — E785 Hyperlipidemia, unspecified: Secondary | ICD-10-CM

## 2021-11-08 DIAGNOSIS — J439 Emphysema, unspecified: Secondary | ICD-10-CM | POA: Diagnosis not present

## 2021-11-08 DIAGNOSIS — I890 Lymphedema, not elsewhere classified: Secondary | ICD-10-CM | POA: Diagnosis not present

## 2021-11-08 DIAGNOSIS — L03115 Cellulitis of right lower limb: Secondary | ICD-10-CM | POA: Diagnosis not present

## 2021-11-08 DIAGNOSIS — L03116 Cellulitis of left lower limb: Secondary | ICD-10-CM | POA: Diagnosis not present

## 2021-11-08 DIAGNOSIS — G309 Alzheimer's disease, unspecified: Secondary | ICD-10-CM | POA: Diagnosis not present

## 2021-11-08 DIAGNOSIS — F0280 Dementia in other diseases classified elsewhere without behavioral disturbance: Secondary | ICD-10-CM | POA: Diagnosis not present

## 2021-11-10 DIAGNOSIS — L03115 Cellulitis of right lower limb: Secondary | ICD-10-CM | POA: Diagnosis not present

## 2021-11-10 DIAGNOSIS — J439 Emphysema, unspecified: Secondary | ICD-10-CM | POA: Diagnosis not present

## 2021-11-10 DIAGNOSIS — L03116 Cellulitis of left lower limb: Secondary | ICD-10-CM | POA: Diagnosis not present

## 2021-11-10 DIAGNOSIS — I890 Lymphedema, not elsewhere classified: Secondary | ICD-10-CM | POA: Diagnosis not present

## 2021-11-10 DIAGNOSIS — G309 Alzheimer's disease, unspecified: Secondary | ICD-10-CM | POA: Diagnosis not present

## 2021-11-10 DIAGNOSIS — F0280 Dementia in other diseases classified elsewhere without behavioral disturbance: Secondary | ICD-10-CM | POA: Diagnosis not present

## 2021-11-13 DIAGNOSIS — F0280 Dementia in other diseases classified elsewhere without behavioral disturbance: Secondary | ICD-10-CM | POA: Diagnosis not present

## 2021-11-13 DIAGNOSIS — L03116 Cellulitis of left lower limb: Secondary | ICD-10-CM | POA: Diagnosis not present

## 2021-11-13 DIAGNOSIS — G309 Alzheimer's disease, unspecified: Secondary | ICD-10-CM | POA: Diagnosis not present

## 2021-11-13 DIAGNOSIS — I890 Lymphedema, not elsewhere classified: Secondary | ICD-10-CM | POA: Diagnosis not present

## 2021-11-13 DIAGNOSIS — L03115 Cellulitis of right lower limb: Secondary | ICD-10-CM | POA: Diagnosis not present

## 2021-11-13 DIAGNOSIS — J439 Emphysema, unspecified: Secondary | ICD-10-CM | POA: Diagnosis not present

## 2021-11-14 DIAGNOSIS — F0280 Dementia in other diseases classified elsewhere without behavioral disturbance: Secondary | ICD-10-CM | POA: Diagnosis not present

## 2021-11-14 DIAGNOSIS — L03115 Cellulitis of right lower limb: Secondary | ICD-10-CM | POA: Diagnosis not present

## 2021-11-14 DIAGNOSIS — L03116 Cellulitis of left lower limb: Secondary | ICD-10-CM | POA: Diagnosis not present

## 2021-11-14 DIAGNOSIS — G309 Alzheimer's disease, unspecified: Secondary | ICD-10-CM | POA: Diagnosis not present

## 2021-11-14 DIAGNOSIS — J439 Emphysema, unspecified: Secondary | ICD-10-CM | POA: Diagnosis not present

## 2021-11-14 DIAGNOSIS — I890 Lymphedema, not elsewhere classified: Secondary | ICD-10-CM | POA: Diagnosis not present

## 2021-11-15 ENCOUNTER — Other Ambulatory Visit: Payer: Self-pay | Admitting: Nurse Practitioner

## 2021-11-17 DIAGNOSIS — I890 Lymphedema, not elsewhere classified: Secondary | ICD-10-CM | POA: Diagnosis not present

## 2021-11-17 DIAGNOSIS — F0280 Dementia in other diseases classified elsewhere without behavioral disturbance: Secondary | ICD-10-CM | POA: Diagnosis not present

## 2021-11-17 DIAGNOSIS — L03116 Cellulitis of left lower limb: Secondary | ICD-10-CM | POA: Diagnosis not present

## 2021-11-17 DIAGNOSIS — L03115 Cellulitis of right lower limb: Secondary | ICD-10-CM | POA: Diagnosis not present

## 2021-11-17 DIAGNOSIS — G309 Alzheimer's disease, unspecified: Secondary | ICD-10-CM | POA: Diagnosis not present

## 2021-11-17 DIAGNOSIS — J439 Emphysema, unspecified: Secondary | ICD-10-CM | POA: Diagnosis not present

## 2021-11-20 DIAGNOSIS — G309 Alzheimer's disease, unspecified: Secondary | ICD-10-CM | POA: Diagnosis not present

## 2021-11-20 DIAGNOSIS — J439 Emphysema, unspecified: Secondary | ICD-10-CM | POA: Diagnosis not present

## 2021-11-20 DIAGNOSIS — L03116 Cellulitis of left lower limb: Secondary | ICD-10-CM | POA: Diagnosis not present

## 2021-11-20 DIAGNOSIS — L03115 Cellulitis of right lower limb: Secondary | ICD-10-CM | POA: Diagnosis not present

## 2021-11-20 DIAGNOSIS — I890 Lymphedema, not elsewhere classified: Secondary | ICD-10-CM | POA: Diagnosis not present

## 2021-11-20 DIAGNOSIS — F0280 Dementia in other diseases classified elsewhere without behavioral disturbance: Secondary | ICD-10-CM | POA: Diagnosis not present

## 2021-11-23 DIAGNOSIS — G309 Alzheimer's disease, unspecified: Secondary | ICD-10-CM | POA: Diagnosis not present

## 2021-11-23 DIAGNOSIS — L03115 Cellulitis of right lower limb: Secondary | ICD-10-CM | POA: Diagnosis not present

## 2021-11-23 DIAGNOSIS — F0280 Dementia in other diseases classified elsewhere without behavioral disturbance: Secondary | ICD-10-CM | POA: Diagnosis not present

## 2021-11-23 DIAGNOSIS — I890 Lymphedema, not elsewhere classified: Secondary | ICD-10-CM | POA: Diagnosis not present

## 2021-11-23 DIAGNOSIS — J439 Emphysema, unspecified: Secondary | ICD-10-CM | POA: Diagnosis not present

## 2021-11-23 DIAGNOSIS — L03116 Cellulitis of left lower limb: Secondary | ICD-10-CM | POA: Diagnosis not present

## 2021-11-27 DIAGNOSIS — L03116 Cellulitis of left lower limb: Secondary | ICD-10-CM | POA: Diagnosis not present

## 2021-11-27 DIAGNOSIS — L03115 Cellulitis of right lower limb: Secondary | ICD-10-CM | POA: Diagnosis not present

## 2021-11-27 DIAGNOSIS — G309 Alzheimer's disease, unspecified: Secondary | ICD-10-CM | POA: Diagnosis not present

## 2021-11-27 DIAGNOSIS — F0280 Dementia in other diseases classified elsewhere without behavioral disturbance: Secondary | ICD-10-CM | POA: Diagnosis not present

## 2021-11-27 DIAGNOSIS — J439 Emphysema, unspecified: Secondary | ICD-10-CM | POA: Diagnosis not present

## 2021-11-27 DIAGNOSIS — I890 Lymphedema, not elsewhere classified: Secondary | ICD-10-CM | POA: Diagnosis not present

## 2021-11-29 DIAGNOSIS — G309 Alzheimer's disease, unspecified: Secondary | ICD-10-CM | POA: Diagnosis not present

## 2021-11-29 DIAGNOSIS — L03115 Cellulitis of right lower limb: Secondary | ICD-10-CM | POA: Diagnosis not present

## 2021-11-29 DIAGNOSIS — L03116 Cellulitis of left lower limb: Secondary | ICD-10-CM | POA: Diagnosis not present

## 2021-11-29 DIAGNOSIS — J439 Emphysema, unspecified: Secondary | ICD-10-CM | POA: Diagnosis not present

## 2021-11-29 DIAGNOSIS — F0280 Dementia in other diseases classified elsewhere without behavioral disturbance: Secondary | ICD-10-CM | POA: Diagnosis not present

## 2021-11-29 DIAGNOSIS — I890 Lymphedema, not elsewhere classified: Secondary | ICD-10-CM | POA: Diagnosis not present

## 2021-12-01 DIAGNOSIS — G309 Alzheimer's disease, unspecified: Secondary | ICD-10-CM | POA: Diagnosis not present

## 2021-12-01 DIAGNOSIS — F0280 Dementia in other diseases classified elsewhere without behavioral disturbance: Secondary | ICD-10-CM | POA: Diagnosis not present

## 2021-12-01 DIAGNOSIS — L03116 Cellulitis of left lower limb: Secondary | ICD-10-CM | POA: Diagnosis not present

## 2021-12-01 DIAGNOSIS — J439 Emphysema, unspecified: Secondary | ICD-10-CM | POA: Diagnosis not present

## 2021-12-01 DIAGNOSIS — I890 Lymphedema, not elsewhere classified: Secondary | ICD-10-CM | POA: Diagnosis not present

## 2021-12-01 DIAGNOSIS — L03115 Cellulitis of right lower limb: Secondary | ICD-10-CM | POA: Diagnosis not present

## 2021-12-03 DIAGNOSIS — J439 Emphysema, unspecified: Secondary | ICD-10-CM | POA: Diagnosis not present

## 2021-12-03 DIAGNOSIS — G309 Alzheimer's disease, unspecified: Secondary | ICD-10-CM | POA: Diagnosis not present

## 2021-12-03 DIAGNOSIS — F17210 Nicotine dependence, cigarettes, uncomplicated: Secondary | ICD-10-CM | POA: Diagnosis not present

## 2021-12-03 DIAGNOSIS — F0280 Dementia in other diseases classified elsewhere without behavioral disturbance: Secondary | ICD-10-CM | POA: Diagnosis not present

## 2021-12-03 DIAGNOSIS — Z7982 Long term (current) use of aspirin: Secondary | ICD-10-CM | POA: Diagnosis not present

## 2021-12-03 DIAGNOSIS — E039 Hypothyroidism, unspecified: Secondary | ICD-10-CM | POA: Diagnosis not present

## 2021-12-03 DIAGNOSIS — N1832 Chronic kidney disease, stage 3b: Secondary | ICD-10-CM | POA: Diagnosis not present

## 2021-12-03 DIAGNOSIS — L03116 Cellulitis of left lower limb: Secondary | ICD-10-CM | POA: Diagnosis not present

## 2021-12-03 DIAGNOSIS — L03115 Cellulitis of right lower limb: Secondary | ICD-10-CM | POA: Diagnosis not present

## 2021-12-03 DIAGNOSIS — R911 Solitary pulmonary nodule: Secondary | ICD-10-CM | POA: Diagnosis not present

## 2021-12-03 DIAGNOSIS — E876 Hypokalemia: Secondary | ICD-10-CM | POA: Diagnosis not present

## 2021-12-03 DIAGNOSIS — Z7401 Bed confinement status: Secondary | ICD-10-CM | POA: Diagnosis not present

## 2021-12-03 DIAGNOSIS — I890 Lymphedema, not elsewhere classified: Secondary | ICD-10-CM | POA: Diagnosis not present

## 2021-12-05 ENCOUNTER — Other Ambulatory Visit: Payer: Self-pay | Admitting: Internal Medicine

## 2021-12-05 DIAGNOSIS — L03116 Cellulitis of left lower limb: Secondary | ICD-10-CM | POA: Diagnosis not present

## 2021-12-05 DIAGNOSIS — F0280 Dementia in other diseases classified elsewhere without behavioral disturbance: Secondary | ICD-10-CM | POA: Diagnosis not present

## 2021-12-05 DIAGNOSIS — J439 Emphysema, unspecified: Secondary | ICD-10-CM | POA: Diagnosis not present

## 2021-12-05 DIAGNOSIS — I890 Lymphedema, not elsewhere classified: Secondary | ICD-10-CM | POA: Diagnosis not present

## 2021-12-05 DIAGNOSIS — G309 Alzheimer's disease, unspecified: Secondary | ICD-10-CM | POA: Diagnosis not present

## 2021-12-05 DIAGNOSIS — L03115 Cellulitis of right lower limb: Secondary | ICD-10-CM | POA: Diagnosis not present

## 2021-12-07 DIAGNOSIS — L03116 Cellulitis of left lower limb: Secondary | ICD-10-CM | POA: Diagnosis not present

## 2021-12-07 DIAGNOSIS — L03115 Cellulitis of right lower limb: Secondary | ICD-10-CM | POA: Diagnosis not present

## 2021-12-07 DIAGNOSIS — I890 Lymphedema, not elsewhere classified: Secondary | ICD-10-CM | POA: Diagnosis not present

## 2021-12-07 DIAGNOSIS — G309 Alzheimer's disease, unspecified: Secondary | ICD-10-CM | POA: Diagnosis not present

## 2021-12-07 DIAGNOSIS — F0280 Dementia in other diseases classified elsewhere without behavioral disturbance: Secondary | ICD-10-CM | POA: Diagnosis not present

## 2021-12-07 DIAGNOSIS — J439 Emphysema, unspecified: Secondary | ICD-10-CM | POA: Diagnosis not present

## 2021-12-12 DIAGNOSIS — L03116 Cellulitis of left lower limb: Secondary | ICD-10-CM | POA: Diagnosis not present

## 2021-12-12 DIAGNOSIS — F0280 Dementia in other diseases classified elsewhere without behavioral disturbance: Secondary | ICD-10-CM | POA: Diagnosis not present

## 2021-12-12 DIAGNOSIS — G309 Alzheimer's disease, unspecified: Secondary | ICD-10-CM | POA: Diagnosis not present

## 2021-12-12 DIAGNOSIS — L03115 Cellulitis of right lower limb: Secondary | ICD-10-CM | POA: Diagnosis not present

## 2021-12-12 DIAGNOSIS — I890 Lymphedema, not elsewhere classified: Secondary | ICD-10-CM | POA: Diagnosis not present

## 2021-12-12 DIAGNOSIS — J439 Emphysema, unspecified: Secondary | ICD-10-CM | POA: Diagnosis not present

## 2021-12-15 DIAGNOSIS — F0280 Dementia in other diseases classified elsewhere without behavioral disturbance: Secondary | ICD-10-CM | POA: Diagnosis not present

## 2021-12-15 DIAGNOSIS — L03115 Cellulitis of right lower limb: Secondary | ICD-10-CM | POA: Diagnosis not present

## 2021-12-15 DIAGNOSIS — L03116 Cellulitis of left lower limb: Secondary | ICD-10-CM | POA: Diagnosis not present

## 2021-12-15 DIAGNOSIS — J439 Emphysema, unspecified: Secondary | ICD-10-CM | POA: Diagnosis not present

## 2021-12-15 DIAGNOSIS — I890 Lymphedema, not elsewhere classified: Secondary | ICD-10-CM | POA: Diagnosis not present

## 2021-12-15 DIAGNOSIS — G309 Alzheimer's disease, unspecified: Secondary | ICD-10-CM | POA: Diagnosis not present

## 2021-12-19 ENCOUNTER — Telehealth: Payer: Self-pay | Admitting: Nurse Practitioner

## 2021-12-19 DIAGNOSIS — J439 Emphysema, unspecified: Secondary | ICD-10-CM | POA: Diagnosis not present

## 2021-12-19 DIAGNOSIS — L03116 Cellulitis of left lower limb: Secondary | ICD-10-CM | POA: Diagnosis not present

## 2021-12-19 DIAGNOSIS — G309 Alzheimer's disease, unspecified: Secondary | ICD-10-CM | POA: Diagnosis not present

## 2021-12-19 DIAGNOSIS — I890 Lymphedema, not elsewhere classified: Secondary | ICD-10-CM | POA: Diagnosis not present

## 2021-12-19 DIAGNOSIS — L03115 Cellulitis of right lower limb: Secondary | ICD-10-CM | POA: Diagnosis not present

## 2021-12-19 DIAGNOSIS — F0280 Dementia in other diseases classified elsewhere without behavioral disturbance: Secondary | ICD-10-CM | POA: Diagnosis not present

## 2021-12-19 NOTE — Telephone Encounter (Signed)
Please advise 

## 2021-12-19 NOTE — Telephone Encounter (Signed)
Sharyn Lull with [facility] [phone]  Called stating pt family thinks she has a possible UTI &increased confusion. She is wanting to know if she can please get verbal orders for a UA and in & out cath?

## 2021-12-20 DIAGNOSIS — N390 Urinary tract infection, site not specified: Secondary | ICD-10-CM | POA: Diagnosis not present

## 2021-12-20 DIAGNOSIS — I890 Lymphedema, not elsewhere classified: Secondary | ICD-10-CM | POA: Diagnosis not present

## 2021-12-20 DIAGNOSIS — G309 Alzheimer's disease, unspecified: Secondary | ICD-10-CM | POA: Diagnosis not present

## 2021-12-20 DIAGNOSIS — J439 Emphysema, unspecified: Secondary | ICD-10-CM | POA: Diagnosis not present

## 2021-12-20 DIAGNOSIS — F0280 Dementia in other diseases classified elsewhere without behavioral disturbance: Secondary | ICD-10-CM | POA: Diagnosis not present

## 2021-12-20 DIAGNOSIS — L03116 Cellulitis of left lower limb: Secondary | ICD-10-CM | POA: Diagnosis not present

## 2021-12-20 DIAGNOSIS — L03115 Cellulitis of right lower limb: Secondary | ICD-10-CM | POA: Diagnosis not present

## 2021-12-20 NOTE — Telephone Encounter (Signed)
Spoke with Victoria Sims gave verbal

## 2021-12-21 DIAGNOSIS — G309 Alzheimer's disease, unspecified: Secondary | ICD-10-CM | POA: Diagnosis not present

## 2021-12-21 DIAGNOSIS — L03115 Cellulitis of right lower limb: Secondary | ICD-10-CM | POA: Diagnosis not present

## 2021-12-21 DIAGNOSIS — I890 Lymphedema, not elsewhere classified: Secondary | ICD-10-CM | POA: Diagnosis not present

## 2021-12-21 DIAGNOSIS — J439 Emphysema, unspecified: Secondary | ICD-10-CM | POA: Diagnosis not present

## 2021-12-21 DIAGNOSIS — F0280 Dementia in other diseases classified elsewhere without behavioral disturbance: Secondary | ICD-10-CM | POA: Diagnosis not present

## 2021-12-21 DIAGNOSIS — L03116 Cellulitis of left lower limb: Secondary | ICD-10-CM | POA: Diagnosis not present

## 2021-12-26 DIAGNOSIS — J439 Emphysema, unspecified: Secondary | ICD-10-CM | POA: Diagnosis not present

## 2021-12-26 DIAGNOSIS — F0280 Dementia in other diseases classified elsewhere without behavioral disturbance: Secondary | ICD-10-CM | POA: Diagnosis not present

## 2021-12-26 DIAGNOSIS — L03116 Cellulitis of left lower limb: Secondary | ICD-10-CM | POA: Diagnosis not present

## 2021-12-26 DIAGNOSIS — L03115 Cellulitis of right lower limb: Secondary | ICD-10-CM | POA: Diagnosis not present

## 2021-12-26 DIAGNOSIS — G309 Alzheimer's disease, unspecified: Secondary | ICD-10-CM | POA: Diagnosis not present

## 2021-12-26 DIAGNOSIS — I890 Lymphedema, not elsewhere classified: Secondary | ICD-10-CM | POA: Diagnosis not present

## 2021-12-28 DIAGNOSIS — I890 Lymphedema, not elsewhere classified: Secondary | ICD-10-CM | POA: Diagnosis not present

## 2021-12-28 DIAGNOSIS — L03116 Cellulitis of left lower limb: Secondary | ICD-10-CM | POA: Diagnosis not present

## 2021-12-28 DIAGNOSIS — F0280 Dementia in other diseases classified elsewhere without behavioral disturbance: Secondary | ICD-10-CM | POA: Diagnosis not present

## 2021-12-28 DIAGNOSIS — G309 Alzheimer's disease, unspecified: Secondary | ICD-10-CM | POA: Diagnosis not present

## 2021-12-28 DIAGNOSIS — L03115 Cellulitis of right lower limb: Secondary | ICD-10-CM | POA: Diagnosis not present

## 2021-12-28 DIAGNOSIS — J439 Emphysema, unspecified: Secondary | ICD-10-CM | POA: Diagnosis not present

## 2021-12-29 DIAGNOSIS — J439 Emphysema, unspecified: Secondary | ICD-10-CM | POA: Diagnosis not present

## 2021-12-29 DIAGNOSIS — L03116 Cellulitis of left lower limb: Secondary | ICD-10-CM | POA: Diagnosis not present

## 2021-12-29 DIAGNOSIS — I890 Lymphedema, not elsewhere classified: Secondary | ICD-10-CM | POA: Diagnosis not present

## 2021-12-29 DIAGNOSIS — F0280 Dementia in other diseases classified elsewhere without behavioral disturbance: Secondary | ICD-10-CM | POA: Diagnosis not present

## 2021-12-29 DIAGNOSIS — G309 Alzheimer's disease, unspecified: Secondary | ICD-10-CM | POA: Diagnosis not present

## 2021-12-29 DIAGNOSIS — L03115 Cellulitis of right lower limb: Secondary | ICD-10-CM | POA: Diagnosis not present

## 2022-01-02 DIAGNOSIS — Z7401 Bed confinement status: Secondary | ICD-10-CM | POA: Diagnosis not present

## 2022-01-02 DIAGNOSIS — L89626 Pressure-induced deep tissue damage of left heel: Secondary | ICD-10-CM | POA: Diagnosis not present

## 2022-01-02 DIAGNOSIS — G309 Alzheimer's disease, unspecified: Secondary | ICD-10-CM | POA: Diagnosis not present

## 2022-01-02 DIAGNOSIS — F0280 Dementia in other diseases classified elsewhere without behavioral disturbance: Secondary | ICD-10-CM | POA: Diagnosis not present

## 2022-01-02 DIAGNOSIS — N1832 Chronic kidney disease, stage 3b: Secondary | ICD-10-CM | POA: Diagnosis not present

## 2022-01-02 DIAGNOSIS — I890 Lymphedema, not elsewhere classified: Secondary | ICD-10-CM | POA: Diagnosis not present

## 2022-01-02 DIAGNOSIS — E876 Hypokalemia: Secondary | ICD-10-CM | POA: Diagnosis not present

## 2022-01-02 DIAGNOSIS — Z7982 Long term (current) use of aspirin: Secondary | ICD-10-CM | POA: Diagnosis not present

## 2022-01-02 DIAGNOSIS — R911 Solitary pulmonary nodule: Secondary | ICD-10-CM | POA: Diagnosis not present

## 2022-01-02 DIAGNOSIS — J439 Emphysema, unspecified: Secondary | ICD-10-CM | POA: Diagnosis not present

## 2022-01-02 DIAGNOSIS — E039 Hypothyroidism, unspecified: Secondary | ICD-10-CM | POA: Diagnosis not present

## 2022-01-02 DIAGNOSIS — F17210 Nicotine dependence, cigarettes, uncomplicated: Secondary | ICD-10-CM | POA: Diagnosis not present

## 2022-01-05 DIAGNOSIS — I890 Lymphedema, not elsewhere classified: Secondary | ICD-10-CM | POA: Diagnosis not present

## 2022-01-05 DIAGNOSIS — J439 Emphysema, unspecified: Secondary | ICD-10-CM | POA: Diagnosis not present

## 2022-01-05 DIAGNOSIS — G309 Alzheimer's disease, unspecified: Secondary | ICD-10-CM | POA: Diagnosis not present

## 2022-01-05 DIAGNOSIS — E039 Hypothyroidism, unspecified: Secondary | ICD-10-CM | POA: Diagnosis not present

## 2022-01-05 DIAGNOSIS — L89626 Pressure-induced deep tissue damage of left heel: Secondary | ICD-10-CM | POA: Diagnosis not present

## 2022-01-05 DIAGNOSIS — F0280 Dementia in other diseases classified elsewhere without behavioral disturbance: Secondary | ICD-10-CM | POA: Diagnosis not present

## 2022-01-09 DIAGNOSIS — L89626 Pressure-induced deep tissue damage of left heel: Secondary | ICD-10-CM | POA: Diagnosis not present

## 2022-01-09 DIAGNOSIS — G309 Alzheimer's disease, unspecified: Secondary | ICD-10-CM | POA: Diagnosis not present

## 2022-01-09 DIAGNOSIS — E039 Hypothyroidism, unspecified: Secondary | ICD-10-CM | POA: Diagnosis not present

## 2022-01-09 DIAGNOSIS — F0280 Dementia in other diseases classified elsewhere without behavioral disturbance: Secondary | ICD-10-CM | POA: Diagnosis not present

## 2022-01-09 DIAGNOSIS — I890 Lymphedema, not elsewhere classified: Secondary | ICD-10-CM | POA: Diagnosis not present

## 2022-01-09 DIAGNOSIS — J439 Emphysema, unspecified: Secondary | ICD-10-CM | POA: Diagnosis not present

## 2022-01-10 DIAGNOSIS — L89626 Pressure-induced deep tissue damage of left heel: Secondary | ICD-10-CM | POA: Diagnosis not present

## 2022-01-10 DIAGNOSIS — J439 Emphysema, unspecified: Secondary | ICD-10-CM | POA: Diagnosis not present

## 2022-01-10 DIAGNOSIS — I890 Lymphedema, not elsewhere classified: Secondary | ICD-10-CM | POA: Diagnosis not present

## 2022-01-10 DIAGNOSIS — G309 Alzheimer's disease, unspecified: Secondary | ICD-10-CM | POA: Diagnosis not present

## 2022-01-10 DIAGNOSIS — F0280 Dementia in other diseases classified elsewhere without behavioral disturbance: Secondary | ICD-10-CM | POA: Diagnosis not present

## 2022-01-10 DIAGNOSIS — E039 Hypothyroidism, unspecified: Secondary | ICD-10-CM | POA: Diagnosis not present

## 2022-01-12 DIAGNOSIS — L89626 Pressure-induced deep tissue damage of left heel: Secondary | ICD-10-CM | POA: Diagnosis not present

## 2022-01-12 DIAGNOSIS — G309 Alzheimer's disease, unspecified: Secondary | ICD-10-CM | POA: Diagnosis not present

## 2022-01-12 DIAGNOSIS — J439 Emphysema, unspecified: Secondary | ICD-10-CM | POA: Diagnosis not present

## 2022-01-12 DIAGNOSIS — E039 Hypothyroidism, unspecified: Secondary | ICD-10-CM | POA: Diagnosis not present

## 2022-01-12 DIAGNOSIS — F0280 Dementia in other diseases classified elsewhere without behavioral disturbance: Secondary | ICD-10-CM | POA: Diagnosis not present

## 2022-01-12 DIAGNOSIS — I890 Lymphedema, not elsewhere classified: Secondary | ICD-10-CM | POA: Diagnosis not present

## 2022-01-15 DIAGNOSIS — G309 Alzheimer's disease, unspecified: Secondary | ICD-10-CM | POA: Diagnosis not present

## 2022-01-15 DIAGNOSIS — E039 Hypothyroidism, unspecified: Secondary | ICD-10-CM | POA: Diagnosis not present

## 2022-01-15 DIAGNOSIS — F0280 Dementia in other diseases classified elsewhere without behavioral disturbance: Secondary | ICD-10-CM | POA: Diagnosis not present

## 2022-01-15 DIAGNOSIS — J439 Emphysema, unspecified: Secondary | ICD-10-CM | POA: Diagnosis not present

## 2022-01-15 DIAGNOSIS — L89626 Pressure-induced deep tissue damage of left heel: Secondary | ICD-10-CM | POA: Diagnosis not present

## 2022-01-15 DIAGNOSIS — I890 Lymphedema, not elsewhere classified: Secondary | ICD-10-CM | POA: Diagnosis not present

## 2022-01-16 DIAGNOSIS — J439 Emphysema, unspecified: Secondary | ICD-10-CM | POA: Diagnosis not present

## 2022-01-16 DIAGNOSIS — F0280 Dementia in other diseases classified elsewhere without behavioral disturbance: Secondary | ICD-10-CM | POA: Diagnosis not present

## 2022-01-16 DIAGNOSIS — L89626 Pressure-induced deep tissue damage of left heel: Secondary | ICD-10-CM | POA: Diagnosis not present

## 2022-01-16 DIAGNOSIS — I890 Lymphedema, not elsewhere classified: Secondary | ICD-10-CM | POA: Diagnosis not present

## 2022-01-16 DIAGNOSIS — E039 Hypothyroidism, unspecified: Secondary | ICD-10-CM | POA: Diagnosis not present

## 2022-01-16 DIAGNOSIS — G309 Alzheimer's disease, unspecified: Secondary | ICD-10-CM | POA: Diagnosis not present

## 2022-01-17 DIAGNOSIS — G309 Alzheimer's disease, unspecified: Secondary | ICD-10-CM | POA: Diagnosis not present

## 2022-01-17 DIAGNOSIS — L89626 Pressure-induced deep tissue damage of left heel: Secondary | ICD-10-CM | POA: Diagnosis not present

## 2022-01-17 DIAGNOSIS — I890 Lymphedema, not elsewhere classified: Secondary | ICD-10-CM | POA: Diagnosis not present

## 2022-01-17 DIAGNOSIS — J439 Emphysema, unspecified: Secondary | ICD-10-CM | POA: Diagnosis not present

## 2022-01-17 DIAGNOSIS — E039 Hypothyroidism, unspecified: Secondary | ICD-10-CM | POA: Diagnosis not present

## 2022-01-17 DIAGNOSIS — F0280 Dementia in other diseases classified elsewhere without behavioral disturbance: Secondary | ICD-10-CM | POA: Diagnosis not present

## 2022-01-22 DIAGNOSIS — G309 Alzheimer's disease, unspecified: Secondary | ICD-10-CM | POA: Diagnosis not present

## 2022-01-22 DIAGNOSIS — L89626 Pressure-induced deep tissue damage of left heel: Secondary | ICD-10-CM | POA: Diagnosis not present

## 2022-01-22 DIAGNOSIS — F0280 Dementia in other diseases classified elsewhere without behavioral disturbance: Secondary | ICD-10-CM | POA: Diagnosis not present

## 2022-01-22 DIAGNOSIS — E039 Hypothyroidism, unspecified: Secondary | ICD-10-CM | POA: Diagnosis not present

## 2022-01-22 DIAGNOSIS — I890 Lymphedema, not elsewhere classified: Secondary | ICD-10-CM | POA: Diagnosis not present

## 2022-01-22 DIAGNOSIS — J439 Emphysema, unspecified: Secondary | ICD-10-CM | POA: Diagnosis not present

## 2022-01-29 DIAGNOSIS — F0280 Dementia in other diseases classified elsewhere without behavioral disturbance: Secondary | ICD-10-CM | POA: Diagnosis not present

## 2022-01-29 DIAGNOSIS — G309 Alzheimer's disease, unspecified: Secondary | ICD-10-CM | POA: Diagnosis not present

## 2022-01-29 DIAGNOSIS — E039 Hypothyroidism, unspecified: Secondary | ICD-10-CM | POA: Diagnosis not present

## 2022-01-29 DIAGNOSIS — I890 Lymphedema, not elsewhere classified: Secondary | ICD-10-CM | POA: Diagnosis not present

## 2022-01-29 DIAGNOSIS — L89626 Pressure-induced deep tissue damage of left heel: Secondary | ICD-10-CM | POA: Diagnosis not present

## 2022-01-29 DIAGNOSIS — J439 Emphysema, unspecified: Secondary | ICD-10-CM | POA: Diagnosis not present

## 2022-01-30 DIAGNOSIS — J439 Emphysema, unspecified: Secondary | ICD-10-CM | POA: Diagnosis not present

## 2022-01-30 DIAGNOSIS — I890 Lymphedema, not elsewhere classified: Secondary | ICD-10-CM | POA: Diagnosis not present

## 2022-01-30 DIAGNOSIS — L89626 Pressure-induced deep tissue damage of left heel: Secondary | ICD-10-CM | POA: Diagnosis not present

## 2022-01-30 DIAGNOSIS — F0280 Dementia in other diseases classified elsewhere without behavioral disturbance: Secondary | ICD-10-CM | POA: Diagnosis not present

## 2022-01-30 DIAGNOSIS — E039 Hypothyroidism, unspecified: Secondary | ICD-10-CM | POA: Diagnosis not present

## 2022-01-30 DIAGNOSIS — G309 Alzheimer's disease, unspecified: Secondary | ICD-10-CM | POA: Diagnosis not present

## 2022-02-01 DIAGNOSIS — E876 Hypokalemia: Secondary | ICD-10-CM | POA: Diagnosis not present

## 2022-02-01 DIAGNOSIS — E039 Hypothyroidism, unspecified: Secondary | ICD-10-CM | POA: Diagnosis not present

## 2022-02-01 DIAGNOSIS — J439 Emphysema, unspecified: Secondary | ICD-10-CM | POA: Diagnosis not present

## 2022-02-01 DIAGNOSIS — G309 Alzheimer's disease, unspecified: Secondary | ICD-10-CM | POA: Diagnosis not present

## 2022-02-01 DIAGNOSIS — F0280 Dementia in other diseases classified elsewhere without behavioral disturbance: Secondary | ICD-10-CM | POA: Diagnosis not present

## 2022-02-01 DIAGNOSIS — I890 Lymphedema, not elsewhere classified: Secondary | ICD-10-CM | POA: Diagnosis not present

## 2022-02-01 DIAGNOSIS — L89626 Pressure-induced deep tissue damage of left heel: Secondary | ICD-10-CM | POA: Diagnosis not present

## 2022-02-01 DIAGNOSIS — F17210 Nicotine dependence, cigarettes, uncomplicated: Secondary | ICD-10-CM | POA: Diagnosis not present

## 2022-02-01 DIAGNOSIS — R911 Solitary pulmonary nodule: Secondary | ICD-10-CM | POA: Diagnosis not present

## 2022-02-01 DIAGNOSIS — N1832 Chronic kidney disease, stage 3b: Secondary | ICD-10-CM | POA: Diagnosis not present

## 2022-02-01 DIAGNOSIS — Z7401 Bed confinement status: Secondary | ICD-10-CM | POA: Diagnosis not present

## 2022-02-01 DIAGNOSIS — Z7982 Long term (current) use of aspirin: Secondary | ICD-10-CM | POA: Diagnosis not present

## 2022-02-05 ENCOUNTER — Other Ambulatory Visit: Payer: Self-pay | Admitting: Nurse Practitioner

## 2022-02-05 DIAGNOSIS — G309 Alzheimer's disease, unspecified: Secondary | ICD-10-CM | POA: Diagnosis not present

## 2022-02-05 DIAGNOSIS — I890 Lymphedema, not elsewhere classified: Secondary | ICD-10-CM | POA: Diagnosis not present

## 2022-02-05 DIAGNOSIS — F0280 Dementia in other diseases classified elsewhere without behavioral disturbance: Secondary | ICD-10-CM | POA: Diagnosis not present

## 2022-02-05 DIAGNOSIS — E039 Hypothyroidism, unspecified: Secondary | ICD-10-CM | POA: Diagnosis not present

## 2022-02-05 DIAGNOSIS — J439 Emphysema, unspecified: Secondary | ICD-10-CM | POA: Diagnosis not present

## 2022-02-05 DIAGNOSIS — L89626 Pressure-induced deep tissue damage of left heel: Secondary | ICD-10-CM | POA: Diagnosis not present

## 2022-02-06 DIAGNOSIS — I890 Lymphedema, not elsewhere classified: Secondary | ICD-10-CM | POA: Diagnosis not present

## 2022-02-06 DIAGNOSIS — E039 Hypothyroidism, unspecified: Secondary | ICD-10-CM | POA: Diagnosis not present

## 2022-02-06 DIAGNOSIS — F0280 Dementia in other diseases classified elsewhere without behavioral disturbance: Secondary | ICD-10-CM | POA: Diagnosis not present

## 2022-02-06 DIAGNOSIS — G309 Alzheimer's disease, unspecified: Secondary | ICD-10-CM | POA: Diagnosis not present

## 2022-02-06 DIAGNOSIS — J439 Emphysema, unspecified: Secondary | ICD-10-CM | POA: Diagnosis not present

## 2022-02-06 DIAGNOSIS — L89626 Pressure-induced deep tissue damage of left heel: Secondary | ICD-10-CM | POA: Diagnosis not present

## 2022-02-08 DIAGNOSIS — G309 Alzheimer's disease, unspecified: Secondary | ICD-10-CM | POA: Diagnosis not present

## 2022-02-08 DIAGNOSIS — E039 Hypothyroidism, unspecified: Secondary | ICD-10-CM | POA: Diagnosis not present

## 2022-02-08 DIAGNOSIS — J439 Emphysema, unspecified: Secondary | ICD-10-CM | POA: Diagnosis not present

## 2022-02-08 DIAGNOSIS — L89626 Pressure-induced deep tissue damage of left heel: Secondary | ICD-10-CM | POA: Diagnosis not present

## 2022-02-08 DIAGNOSIS — F0280 Dementia in other diseases classified elsewhere without behavioral disturbance: Secondary | ICD-10-CM | POA: Diagnosis not present

## 2022-02-08 DIAGNOSIS — I890 Lymphedema, not elsewhere classified: Secondary | ICD-10-CM | POA: Diagnosis not present

## 2022-02-12 DIAGNOSIS — G309 Alzheimer's disease, unspecified: Secondary | ICD-10-CM | POA: Diagnosis not present

## 2022-02-12 DIAGNOSIS — I890 Lymphedema, not elsewhere classified: Secondary | ICD-10-CM | POA: Diagnosis not present

## 2022-02-12 DIAGNOSIS — J439 Emphysema, unspecified: Secondary | ICD-10-CM | POA: Diagnosis not present

## 2022-02-12 DIAGNOSIS — F0280 Dementia in other diseases classified elsewhere without behavioral disturbance: Secondary | ICD-10-CM | POA: Diagnosis not present

## 2022-02-12 DIAGNOSIS — E039 Hypothyroidism, unspecified: Secondary | ICD-10-CM | POA: Diagnosis not present

## 2022-02-12 DIAGNOSIS — L89626 Pressure-induced deep tissue damage of left heel: Secondary | ICD-10-CM | POA: Diagnosis not present

## 2022-02-13 DIAGNOSIS — L89626 Pressure-induced deep tissue damage of left heel: Secondary | ICD-10-CM | POA: Diagnosis not present

## 2022-02-13 DIAGNOSIS — G309 Alzheimer's disease, unspecified: Secondary | ICD-10-CM | POA: Diagnosis not present

## 2022-02-13 DIAGNOSIS — E039 Hypothyroidism, unspecified: Secondary | ICD-10-CM | POA: Diagnosis not present

## 2022-02-13 DIAGNOSIS — J439 Emphysema, unspecified: Secondary | ICD-10-CM | POA: Diagnosis not present

## 2022-02-13 DIAGNOSIS — F0280 Dementia in other diseases classified elsewhere without behavioral disturbance: Secondary | ICD-10-CM | POA: Diagnosis not present

## 2022-02-13 DIAGNOSIS — I890 Lymphedema, not elsewhere classified: Secondary | ICD-10-CM | POA: Diagnosis not present

## 2022-02-15 DIAGNOSIS — J439 Emphysema, unspecified: Secondary | ICD-10-CM | POA: Diagnosis not present

## 2022-02-15 DIAGNOSIS — G309 Alzheimer's disease, unspecified: Secondary | ICD-10-CM | POA: Diagnosis not present

## 2022-02-15 DIAGNOSIS — F0280 Dementia in other diseases classified elsewhere without behavioral disturbance: Secondary | ICD-10-CM | POA: Diagnosis not present

## 2022-02-15 DIAGNOSIS — E039 Hypothyroidism, unspecified: Secondary | ICD-10-CM | POA: Diagnosis not present

## 2022-02-15 DIAGNOSIS — I890 Lymphedema, not elsewhere classified: Secondary | ICD-10-CM | POA: Diagnosis not present

## 2022-02-15 DIAGNOSIS — L89626 Pressure-induced deep tissue damage of left heel: Secondary | ICD-10-CM | POA: Diagnosis not present

## 2022-02-20 DIAGNOSIS — E039 Hypothyroidism, unspecified: Secondary | ICD-10-CM | POA: Diagnosis not present

## 2022-02-20 DIAGNOSIS — I890 Lymphedema, not elsewhere classified: Secondary | ICD-10-CM | POA: Diagnosis not present

## 2022-02-20 DIAGNOSIS — G309 Alzheimer's disease, unspecified: Secondary | ICD-10-CM | POA: Diagnosis not present

## 2022-02-20 DIAGNOSIS — L89626 Pressure-induced deep tissue damage of left heel: Secondary | ICD-10-CM | POA: Diagnosis not present

## 2022-02-20 DIAGNOSIS — J439 Emphysema, unspecified: Secondary | ICD-10-CM | POA: Diagnosis not present

## 2022-02-20 DIAGNOSIS — F0280 Dementia in other diseases classified elsewhere without behavioral disturbance: Secondary | ICD-10-CM | POA: Diagnosis not present

## 2022-02-22 DIAGNOSIS — I890 Lymphedema, not elsewhere classified: Secondary | ICD-10-CM | POA: Diagnosis not present

## 2022-02-22 DIAGNOSIS — J439 Emphysema, unspecified: Secondary | ICD-10-CM | POA: Diagnosis not present

## 2022-02-22 DIAGNOSIS — L89626 Pressure-induced deep tissue damage of left heel: Secondary | ICD-10-CM | POA: Diagnosis not present

## 2022-02-22 DIAGNOSIS — E039 Hypothyroidism, unspecified: Secondary | ICD-10-CM | POA: Diagnosis not present

## 2022-02-22 DIAGNOSIS — G309 Alzheimer's disease, unspecified: Secondary | ICD-10-CM | POA: Diagnosis not present

## 2022-02-22 DIAGNOSIS — F0280 Dementia in other diseases classified elsewhere without behavioral disturbance: Secondary | ICD-10-CM | POA: Diagnosis not present

## 2022-02-26 DIAGNOSIS — G309 Alzheimer's disease, unspecified: Secondary | ICD-10-CM | POA: Diagnosis not present

## 2022-02-26 DIAGNOSIS — I890 Lymphedema, not elsewhere classified: Secondary | ICD-10-CM | POA: Diagnosis not present

## 2022-02-26 DIAGNOSIS — F0280 Dementia in other diseases classified elsewhere without behavioral disturbance: Secondary | ICD-10-CM | POA: Diagnosis not present

## 2022-02-26 DIAGNOSIS — L89626 Pressure-induced deep tissue damage of left heel: Secondary | ICD-10-CM | POA: Diagnosis not present

## 2022-02-26 DIAGNOSIS — J439 Emphysema, unspecified: Secondary | ICD-10-CM | POA: Diagnosis not present

## 2022-02-26 DIAGNOSIS — E039 Hypothyroidism, unspecified: Secondary | ICD-10-CM | POA: Diagnosis not present

## 2022-03-01 DIAGNOSIS — E039 Hypothyroidism, unspecified: Secondary | ICD-10-CM | POA: Diagnosis not present

## 2022-03-01 DIAGNOSIS — G309 Alzheimer's disease, unspecified: Secondary | ICD-10-CM | POA: Diagnosis not present

## 2022-03-01 DIAGNOSIS — I890 Lymphedema, not elsewhere classified: Secondary | ICD-10-CM | POA: Diagnosis not present

## 2022-03-01 DIAGNOSIS — L89626 Pressure-induced deep tissue damage of left heel: Secondary | ICD-10-CM | POA: Diagnosis not present

## 2022-03-01 DIAGNOSIS — J439 Emphysema, unspecified: Secondary | ICD-10-CM | POA: Diagnosis not present

## 2022-03-01 DIAGNOSIS — F0280 Dementia in other diseases classified elsewhere without behavioral disturbance: Secondary | ICD-10-CM | POA: Diagnosis not present

## 2022-03-03 DIAGNOSIS — R911 Solitary pulmonary nodule: Secondary | ICD-10-CM | POA: Diagnosis not present

## 2022-03-03 DIAGNOSIS — E876 Hypokalemia: Secondary | ICD-10-CM | POA: Diagnosis not present

## 2022-03-03 DIAGNOSIS — E039 Hypothyroidism, unspecified: Secondary | ICD-10-CM | POA: Diagnosis not present

## 2022-03-03 DIAGNOSIS — Z7401 Bed confinement status: Secondary | ICD-10-CM | POA: Diagnosis not present

## 2022-03-03 DIAGNOSIS — N1832 Chronic kidney disease, stage 3b: Secondary | ICD-10-CM | POA: Diagnosis not present

## 2022-03-03 DIAGNOSIS — G309 Alzheimer's disease, unspecified: Secondary | ICD-10-CM | POA: Diagnosis not present

## 2022-03-03 DIAGNOSIS — I890 Lymphedema, not elsewhere classified: Secondary | ICD-10-CM | POA: Diagnosis not present

## 2022-03-03 DIAGNOSIS — J439 Emphysema, unspecified: Secondary | ICD-10-CM | POA: Diagnosis not present

## 2022-03-03 DIAGNOSIS — Z7982 Long term (current) use of aspirin: Secondary | ICD-10-CM | POA: Diagnosis not present

## 2022-03-03 DIAGNOSIS — F17210 Nicotine dependence, cigarettes, uncomplicated: Secondary | ICD-10-CM | POA: Diagnosis not present

## 2022-03-03 DIAGNOSIS — F0280 Dementia in other diseases classified elsewhere without behavioral disturbance: Secondary | ICD-10-CM | POA: Diagnosis not present

## 2022-03-03 DIAGNOSIS — L89626 Pressure-induced deep tissue damage of left heel: Secondary | ICD-10-CM | POA: Diagnosis not present

## 2022-03-07 DIAGNOSIS — F0280 Dementia in other diseases classified elsewhere without behavioral disturbance: Secondary | ICD-10-CM | POA: Diagnosis not present

## 2022-03-07 DIAGNOSIS — J439 Emphysema, unspecified: Secondary | ICD-10-CM | POA: Diagnosis not present

## 2022-03-07 DIAGNOSIS — I890 Lymphedema, not elsewhere classified: Secondary | ICD-10-CM | POA: Diagnosis not present

## 2022-03-07 DIAGNOSIS — G309 Alzheimer's disease, unspecified: Secondary | ICD-10-CM | POA: Diagnosis not present

## 2022-03-07 DIAGNOSIS — L89626 Pressure-induced deep tissue damage of left heel: Secondary | ICD-10-CM | POA: Diagnosis not present

## 2022-03-07 DIAGNOSIS — E039 Hypothyroidism, unspecified: Secondary | ICD-10-CM | POA: Diagnosis not present

## 2022-03-08 DIAGNOSIS — I890 Lymphedema, not elsewhere classified: Secondary | ICD-10-CM | POA: Diagnosis not present

## 2022-03-08 DIAGNOSIS — G309 Alzheimer's disease, unspecified: Secondary | ICD-10-CM | POA: Diagnosis not present

## 2022-03-08 DIAGNOSIS — L89626 Pressure-induced deep tissue damage of left heel: Secondary | ICD-10-CM | POA: Diagnosis not present

## 2022-03-08 DIAGNOSIS — F0280 Dementia in other diseases classified elsewhere without behavioral disturbance: Secondary | ICD-10-CM | POA: Diagnosis not present

## 2022-03-08 DIAGNOSIS — E039 Hypothyroidism, unspecified: Secondary | ICD-10-CM | POA: Diagnosis not present

## 2022-03-08 DIAGNOSIS — J439 Emphysema, unspecified: Secondary | ICD-10-CM | POA: Diagnosis not present

## 2022-03-09 DIAGNOSIS — G309 Alzheimer's disease, unspecified: Secondary | ICD-10-CM | POA: Diagnosis not present

## 2022-03-09 DIAGNOSIS — L89626 Pressure-induced deep tissue damage of left heel: Secondary | ICD-10-CM | POA: Diagnosis not present

## 2022-03-09 DIAGNOSIS — I890 Lymphedema, not elsewhere classified: Secondary | ICD-10-CM | POA: Diagnosis not present

## 2022-03-09 DIAGNOSIS — J439 Emphysema, unspecified: Secondary | ICD-10-CM | POA: Diagnosis not present

## 2022-03-09 DIAGNOSIS — F0280 Dementia in other diseases classified elsewhere without behavioral disturbance: Secondary | ICD-10-CM | POA: Diagnosis not present

## 2022-03-09 DIAGNOSIS — E039 Hypothyroidism, unspecified: Secondary | ICD-10-CM | POA: Diagnosis not present

## 2022-03-12 DIAGNOSIS — I890 Lymphedema, not elsewhere classified: Secondary | ICD-10-CM | POA: Diagnosis not present

## 2022-03-12 DIAGNOSIS — E039 Hypothyroidism, unspecified: Secondary | ICD-10-CM | POA: Diagnosis not present

## 2022-03-12 DIAGNOSIS — G309 Alzheimer's disease, unspecified: Secondary | ICD-10-CM | POA: Diagnosis not present

## 2022-03-12 DIAGNOSIS — F0280 Dementia in other diseases classified elsewhere without behavioral disturbance: Secondary | ICD-10-CM | POA: Diagnosis not present

## 2022-03-12 DIAGNOSIS — J439 Emphysema, unspecified: Secondary | ICD-10-CM | POA: Diagnosis not present

## 2022-03-12 DIAGNOSIS — L89626 Pressure-induced deep tissue damage of left heel: Secondary | ICD-10-CM | POA: Diagnosis not present

## 2022-03-13 DIAGNOSIS — E039 Hypothyroidism, unspecified: Secondary | ICD-10-CM | POA: Diagnosis not present

## 2022-03-13 DIAGNOSIS — I890 Lymphedema, not elsewhere classified: Secondary | ICD-10-CM | POA: Diagnosis not present

## 2022-03-13 DIAGNOSIS — L89626 Pressure-induced deep tissue damage of left heel: Secondary | ICD-10-CM | POA: Diagnosis not present

## 2022-03-13 DIAGNOSIS — G309 Alzheimer's disease, unspecified: Secondary | ICD-10-CM | POA: Diagnosis not present

## 2022-03-13 DIAGNOSIS — J439 Emphysema, unspecified: Secondary | ICD-10-CM | POA: Diagnosis not present

## 2022-03-13 DIAGNOSIS — F0280 Dementia in other diseases classified elsewhere without behavioral disturbance: Secondary | ICD-10-CM | POA: Diagnosis not present

## 2022-03-15 DIAGNOSIS — G309 Alzheimer's disease, unspecified: Secondary | ICD-10-CM | POA: Diagnosis not present

## 2022-03-15 DIAGNOSIS — L89626 Pressure-induced deep tissue damage of left heel: Secondary | ICD-10-CM | POA: Diagnosis not present

## 2022-03-15 DIAGNOSIS — F0280 Dementia in other diseases classified elsewhere without behavioral disturbance: Secondary | ICD-10-CM | POA: Diagnosis not present

## 2022-03-15 DIAGNOSIS — J439 Emphysema, unspecified: Secondary | ICD-10-CM | POA: Diagnosis not present

## 2022-03-15 DIAGNOSIS — I890 Lymphedema, not elsewhere classified: Secondary | ICD-10-CM | POA: Diagnosis not present

## 2022-03-15 DIAGNOSIS — E039 Hypothyroidism, unspecified: Secondary | ICD-10-CM | POA: Diagnosis not present

## 2022-03-16 ENCOUNTER — Other Ambulatory Visit: Payer: Self-pay | Admitting: Nurse Practitioner

## 2022-03-16 DIAGNOSIS — F419 Anxiety disorder, unspecified: Secondary | ICD-10-CM

## 2022-03-19 DIAGNOSIS — G309 Alzheimer's disease, unspecified: Secondary | ICD-10-CM | POA: Diagnosis not present

## 2022-03-19 DIAGNOSIS — L89626 Pressure-induced deep tissue damage of left heel: Secondary | ICD-10-CM | POA: Diagnosis not present

## 2022-03-19 DIAGNOSIS — I890 Lymphedema, not elsewhere classified: Secondary | ICD-10-CM | POA: Diagnosis not present

## 2022-03-19 DIAGNOSIS — J439 Emphysema, unspecified: Secondary | ICD-10-CM | POA: Diagnosis not present

## 2022-03-19 DIAGNOSIS — E039 Hypothyroidism, unspecified: Secondary | ICD-10-CM | POA: Diagnosis not present

## 2022-03-19 DIAGNOSIS — F0280 Dementia in other diseases classified elsewhere without behavioral disturbance: Secondary | ICD-10-CM | POA: Diagnosis not present

## 2022-03-22 DIAGNOSIS — E039 Hypothyroidism, unspecified: Secondary | ICD-10-CM | POA: Diagnosis not present

## 2022-03-22 DIAGNOSIS — L89626 Pressure-induced deep tissue damage of left heel: Secondary | ICD-10-CM | POA: Diagnosis not present

## 2022-03-22 DIAGNOSIS — F0280 Dementia in other diseases classified elsewhere without behavioral disturbance: Secondary | ICD-10-CM | POA: Diagnosis not present

## 2022-03-22 DIAGNOSIS — I890 Lymphedema, not elsewhere classified: Secondary | ICD-10-CM | POA: Diagnosis not present

## 2022-03-22 DIAGNOSIS — J439 Emphysema, unspecified: Secondary | ICD-10-CM | POA: Diagnosis not present

## 2022-03-22 DIAGNOSIS — G309 Alzheimer's disease, unspecified: Secondary | ICD-10-CM | POA: Diagnosis not present

## 2022-03-27 DIAGNOSIS — G309 Alzheimer's disease, unspecified: Secondary | ICD-10-CM | POA: Diagnosis not present

## 2022-03-27 DIAGNOSIS — E039 Hypothyroidism, unspecified: Secondary | ICD-10-CM | POA: Diagnosis not present

## 2022-03-27 DIAGNOSIS — I890 Lymphedema, not elsewhere classified: Secondary | ICD-10-CM | POA: Diagnosis not present

## 2022-03-27 DIAGNOSIS — F0280 Dementia in other diseases classified elsewhere without behavioral disturbance: Secondary | ICD-10-CM | POA: Diagnosis not present

## 2022-03-27 DIAGNOSIS — L89626 Pressure-induced deep tissue damage of left heel: Secondary | ICD-10-CM | POA: Diagnosis not present

## 2022-03-27 DIAGNOSIS — J439 Emphysema, unspecified: Secondary | ICD-10-CM | POA: Diagnosis not present

## 2022-03-28 DIAGNOSIS — L89626 Pressure-induced deep tissue damage of left heel: Secondary | ICD-10-CM | POA: Diagnosis not present

## 2022-03-28 DIAGNOSIS — E039 Hypothyroidism, unspecified: Secondary | ICD-10-CM | POA: Diagnosis not present

## 2022-03-28 DIAGNOSIS — F0280 Dementia in other diseases classified elsewhere without behavioral disturbance: Secondary | ICD-10-CM | POA: Diagnosis not present

## 2022-03-28 DIAGNOSIS — G309 Alzheimer's disease, unspecified: Secondary | ICD-10-CM | POA: Diagnosis not present

## 2022-03-28 DIAGNOSIS — I890 Lymphedema, not elsewhere classified: Secondary | ICD-10-CM | POA: Diagnosis not present

## 2022-03-28 DIAGNOSIS — J439 Emphysema, unspecified: Secondary | ICD-10-CM | POA: Diagnosis not present

## 2022-04-02 DIAGNOSIS — E039 Hypothyroidism, unspecified: Secondary | ICD-10-CM | POA: Diagnosis not present

## 2022-04-02 DIAGNOSIS — F0280 Dementia in other diseases classified elsewhere without behavioral disturbance: Secondary | ICD-10-CM | POA: Diagnosis not present

## 2022-04-02 DIAGNOSIS — E876 Hypokalemia: Secondary | ICD-10-CM | POA: Diagnosis not present

## 2022-04-02 DIAGNOSIS — G309 Alzheimer's disease, unspecified: Secondary | ICD-10-CM | POA: Diagnosis not present

## 2022-04-02 DIAGNOSIS — I890 Lymphedema, not elsewhere classified: Secondary | ICD-10-CM | POA: Diagnosis not present

## 2022-04-02 DIAGNOSIS — L89626 Pressure-induced deep tissue damage of left heel: Secondary | ICD-10-CM | POA: Diagnosis not present

## 2022-04-02 DIAGNOSIS — R911 Solitary pulmonary nodule: Secondary | ICD-10-CM | POA: Diagnosis not present

## 2022-04-02 DIAGNOSIS — F17210 Nicotine dependence, cigarettes, uncomplicated: Secondary | ICD-10-CM | POA: Diagnosis not present

## 2022-04-02 DIAGNOSIS — J439 Emphysema, unspecified: Secondary | ICD-10-CM | POA: Diagnosis not present

## 2022-04-02 DIAGNOSIS — Z7982 Long term (current) use of aspirin: Secondary | ICD-10-CM | POA: Diagnosis not present

## 2022-04-02 DIAGNOSIS — N1832 Chronic kidney disease, stage 3b: Secondary | ICD-10-CM | POA: Diagnosis not present

## 2022-04-02 DIAGNOSIS — Z7401 Bed confinement status: Secondary | ICD-10-CM | POA: Diagnosis not present

## 2022-04-04 DIAGNOSIS — J439 Emphysema, unspecified: Secondary | ICD-10-CM | POA: Diagnosis not present

## 2022-04-04 DIAGNOSIS — F0280 Dementia in other diseases classified elsewhere without behavioral disturbance: Secondary | ICD-10-CM | POA: Diagnosis not present

## 2022-04-04 DIAGNOSIS — E039 Hypothyroidism, unspecified: Secondary | ICD-10-CM | POA: Diagnosis not present

## 2022-04-04 DIAGNOSIS — I890 Lymphedema, not elsewhere classified: Secondary | ICD-10-CM | POA: Diagnosis not present

## 2022-04-04 DIAGNOSIS — G309 Alzheimer's disease, unspecified: Secondary | ICD-10-CM | POA: Diagnosis not present

## 2022-04-04 DIAGNOSIS — L89626 Pressure-induced deep tissue damage of left heel: Secondary | ICD-10-CM | POA: Diagnosis not present

## 2022-04-09 DIAGNOSIS — E039 Hypothyroidism, unspecified: Secondary | ICD-10-CM | POA: Diagnosis not present

## 2022-04-09 DIAGNOSIS — F0280 Dementia in other diseases classified elsewhere without behavioral disturbance: Secondary | ICD-10-CM | POA: Diagnosis not present

## 2022-04-09 DIAGNOSIS — L89626 Pressure-induced deep tissue damage of left heel: Secondary | ICD-10-CM | POA: Diagnosis not present

## 2022-04-09 DIAGNOSIS — I890 Lymphedema, not elsewhere classified: Secondary | ICD-10-CM | POA: Diagnosis not present

## 2022-04-09 DIAGNOSIS — G309 Alzheimer's disease, unspecified: Secondary | ICD-10-CM | POA: Diagnosis not present

## 2022-04-09 DIAGNOSIS — J439 Emphysema, unspecified: Secondary | ICD-10-CM | POA: Diagnosis not present

## 2022-04-12 DIAGNOSIS — I890 Lymphedema, not elsewhere classified: Secondary | ICD-10-CM | POA: Diagnosis not present

## 2022-04-12 DIAGNOSIS — F0280 Dementia in other diseases classified elsewhere without behavioral disturbance: Secondary | ICD-10-CM | POA: Diagnosis not present

## 2022-04-12 DIAGNOSIS — E039 Hypothyroidism, unspecified: Secondary | ICD-10-CM | POA: Diagnosis not present

## 2022-04-12 DIAGNOSIS — J439 Emphysema, unspecified: Secondary | ICD-10-CM | POA: Diagnosis not present

## 2022-04-12 DIAGNOSIS — G309 Alzheimer's disease, unspecified: Secondary | ICD-10-CM | POA: Diagnosis not present

## 2022-04-12 DIAGNOSIS — L89626 Pressure-induced deep tissue damage of left heel: Secondary | ICD-10-CM | POA: Diagnosis not present

## 2022-04-13 DIAGNOSIS — J439 Emphysema, unspecified: Secondary | ICD-10-CM | POA: Diagnosis not present

## 2022-04-13 DIAGNOSIS — E039 Hypothyroidism, unspecified: Secondary | ICD-10-CM | POA: Diagnosis not present

## 2022-04-13 DIAGNOSIS — L89626 Pressure-induced deep tissue damage of left heel: Secondary | ICD-10-CM | POA: Diagnosis not present

## 2022-04-13 DIAGNOSIS — G309 Alzheimer's disease, unspecified: Secondary | ICD-10-CM | POA: Diagnosis not present

## 2022-04-13 DIAGNOSIS — I890 Lymphedema, not elsewhere classified: Secondary | ICD-10-CM | POA: Diagnosis not present

## 2022-04-13 DIAGNOSIS — F0280 Dementia in other diseases classified elsewhere without behavioral disturbance: Secondary | ICD-10-CM | POA: Diagnosis not present

## 2022-04-16 DIAGNOSIS — E039 Hypothyroidism, unspecified: Secondary | ICD-10-CM | POA: Diagnosis not present

## 2022-04-16 DIAGNOSIS — J439 Emphysema, unspecified: Secondary | ICD-10-CM | POA: Diagnosis not present

## 2022-04-16 DIAGNOSIS — L89626 Pressure-induced deep tissue damage of left heel: Secondary | ICD-10-CM | POA: Diagnosis not present

## 2022-04-16 DIAGNOSIS — G309 Alzheimer's disease, unspecified: Secondary | ICD-10-CM | POA: Diagnosis not present

## 2022-04-16 DIAGNOSIS — F0280 Dementia in other diseases classified elsewhere without behavioral disturbance: Secondary | ICD-10-CM | POA: Diagnosis not present

## 2022-04-16 DIAGNOSIS — I890 Lymphedema, not elsewhere classified: Secondary | ICD-10-CM | POA: Diagnosis not present

## 2022-04-17 DIAGNOSIS — J439 Emphysema, unspecified: Secondary | ICD-10-CM | POA: Diagnosis not present

## 2022-04-17 DIAGNOSIS — I890 Lymphedema, not elsewhere classified: Secondary | ICD-10-CM | POA: Diagnosis not present

## 2022-04-17 DIAGNOSIS — F0280 Dementia in other diseases classified elsewhere without behavioral disturbance: Secondary | ICD-10-CM | POA: Diagnosis not present

## 2022-04-17 DIAGNOSIS — G309 Alzheimer's disease, unspecified: Secondary | ICD-10-CM | POA: Diagnosis not present

## 2022-04-17 DIAGNOSIS — L89626 Pressure-induced deep tissue damage of left heel: Secondary | ICD-10-CM | POA: Diagnosis not present

## 2022-04-17 DIAGNOSIS — E039 Hypothyroidism, unspecified: Secondary | ICD-10-CM | POA: Diagnosis not present

## 2022-04-20 DIAGNOSIS — G309 Alzheimer's disease, unspecified: Secondary | ICD-10-CM | POA: Diagnosis not present

## 2022-04-20 DIAGNOSIS — E039 Hypothyroidism, unspecified: Secondary | ICD-10-CM | POA: Diagnosis not present

## 2022-04-20 DIAGNOSIS — F0280 Dementia in other diseases classified elsewhere without behavioral disturbance: Secondary | ICD-10-CM | POA: Diagnosis not present

## 2022-04-20 DIAGNOSIS — L89626 Pressure-induced deep tissue damage of left heel: Secondary | ICD-10-CM | POA: Diagnosis not present

## 2022-04-20 DIAGNOSIS — I890 Lymphedema, not elsewhere classified: Secondary | ICD-10-CM | POA: Diagnosis not present

## 2022-04-20 DIAGNOSIS — J439 Emphysema, unspecified: Secondary | ICD-10-CM | POA: Diagnosis not present

## 2022-04-24 DIAGNOSIS — E039 Hypothyroidism, unspecified: Secondary | ICD-10-CM | POA: Diagnosis not present

## 2022-04-24 DIAGNOSIS — L89626 Pressure-induced deep tissue damage of left heel: Secondary | ICD-10-CM | POA: Diagnosis not present

## 2022-04-24 DIAGNOSIS — G309 Alzheimer's disease, unspecified: Secondary | ICD-10-CM | POA: Diagnosis not present

## 2022-04-24 DIAGNOSIS — F0280 Dementia in other diseases classified elsewhere without behavioral disturbance: Secondary | ICD-10-CM | POA: Diagnosis not present

## 2022-04-24 DIAGNOSIS — I890 Lymphedema, not elsewhere classified: Secondary | ICD-10-CM | POA: Diagnosis not present

## 2022-04-24 DIAGNOSIS — J439 Emphysema, unspecified: Secondary | ICD-10-CM | POA: Diagnosis not present

## 2022-04-26 DIAGNOSIS — L89626 Pressure-induced deep tissue damage of left heel: Secondary | ICD-10-CM | POA: Diagnosis not present

## 2022-04-26 DIAGNOSIS — J439 Emphysema, unspecified: Secondary | ICD-10-CM | POA: Diagnosis not present

## 2022-04-26 DIAGNOSIS — G309 Alzheimer's disease, unspecified: Secondary | ICD-10-CM | POA: Diagnosis not present

## 2022-04-26 DIAGNOSIS — I890 Lymphedema, not elsewhere classified: Secondary | ICD-10-CM | POA: Diagnosis not present

## 2022-04-26 DIAGNOSIS — F0280 Dementia in other diseases classified elsewhere without behavioral disturbance: Secondary | ICD-10-CM | POA: Diagnosis not present

## 2022-04-26 DIAGNOSIS — E039 Hypothyroidism, unspecified: Secondary | ICD-10-CM | POA: Diagnosis not present

## 2022-04-27 DIAGNOSIS — F0280 Dementia in other diseases classified elsewhere without behavioral disturbance: Secondary | ICD-10-CM | POA: Diagnosis not present

## 2022-04-27 DIAGNOSIS — G309 Alzheimer's disease, unspecified: Secondary | ICD-10-CM | POA: Diagnosis not present

## 2022-04-27 DIAGNOSIS — J439 Emphysema, unspecified: Secondary | ICD-10-CM | POA: Diagnosis not present

## 2022-04-27 DIAGNOSIS — I890 Lymphedema, not elsewhere classified: Secondary | ICD-10-CM | POA: Diagnosis not present

## 2022-04-27 DIAGNOSIS — E039 Hypothyroidism, unspecified: Secondary | ICD-10-CM | POA: Diagnosis not present

## 2022-04-27 DIAGNOSIS — L89626 Pressure-induced deep tissue damage of left heel: Secondary | ICD-10-CM | POA: Diagnosis not present

## 2022-05-02 DIAGNOSIS — F0280 Dementia in other diseases classified elsewhere without behavioral disturbance: Secondary | ICD-10-CM | POA: Diagnosis not present

## 2022-05-02 DIAGNOSIS — J439 Emphysema, unspecified: Secondary | ICD-10-CM | POA: Diagnosis not present

## 2022-05-02 DIAGNOSIS — E039 Hypothyroidism, unspecified: Secondary | ICD-10-CM | POA: Diagnosis not present

## 2022-05-02 DIAGNOSIS — G309 Alzheimer's disease, unspecified: Secondary | ICD-10-CM | POA: Diagnosis not present

## 2022-05-02 DIAGNOSIS — F17210 Nicotine dependence, cigarettes, uncomplicated: Secondary | ICD-10-CM | POA: Diagnosis not present

## 2022-05-02 DIAGNOSIS — Z7982 Long term (current) use of aspirin: Secondary | ICD-10-CM | POA: Diagnosis not present

## 2022-05-02 DIAGNOSIS — I890 Lymphedema, not elsewhere classified: Secondary | ICD-10-CM | POA: Diagnosis not present

## 2022-05-02 DIAGNOSIS — Z7401 Bed confinement status: Secondary | ICD-10-CM | POA: Diagnosis not present

## 2022-05-02 DIAGNOSIS — E876 Hypokalemia: Secondary | ICD-10-CM | POA: Diagnosis not present

## 2022-05-02 DIAGNOSIS — L89891 Pressure ulcer of other site, stage 1: Secondary | ICD-10-CM | POA: Diagnosis not present

## 2022-05-02 DIAGNOSIS — R911 Solitary pulmonary nodule: Secondary | ICD-10-CM | POA: Diagnosis not present

## 2022-05-02 DIAGNOSIS — N1832 Chronic kidney disease, stage 3b: Secondary | ICD-10-CM | POA: Diagnosis not present

## 2022-05-02 DIAGNOSIS — L89626 Pressure-induced deep tissue damage of left heel: Secondary | ICD-10-CM | POA: Diagnosis not present

## 2022-05-03 DIAGNOSIS — G309 Alzheimer's disease, unspecified: Secondary | ICD-10-CM | POA: Diagnosis not present

## 2022-05-03 DIAGNOSIS — J439 Emphysema, unspecified: Secondary | ICD-10-CM | POA: Diagnosis not present

## 2022-05-03 DIAGNOSIS — I890 Lymphedema, not elsewhere classified: Secondary | ICD-10-CM | POA: Diagnosis not present

## 2022-05-03 DIAGNOSIS — F0280 Dementia in other diseases classified elsewhere without behavioral disturbance: Secondary | ICD-10-CM | POA: Diagnosis not present

## 2022-05-03 DIAGNOSIS — L89891 Pressure ulcer of other site, stage 1: Secondary | ICD-10-CM | POA: Diagnosis not present

## 2022-05-03 DIAGNOSIS — L89626 Pressure-induced deep tissue damage of left heel: Secondary | ICD-10-CM | POA: Diagnosis not present

## 2022-05-06 ENCOUNTER — Other Ambulatory Visit: Payer: Self-pay | Admitting: Family Medicine

## 2022-05-09 DIAGNOSIS — F0280 Dementia in other diseases classified elsewhere without behavioral disturbance: Secondary | ICD-10-CM | POA: Diagnosis not present

## 2022-05-09 DIAGNOSIS — L89626 Pressure-induced deep tissue damage of left heel: Secondary | ICD-10-CM | POA: Diagnosis not present

## 2022-05-09 DIAGNOSIS — I890 Lymphedema, not elsewhere classified: Secondary | ICD-10-CM | POA: Diagnosis not present

## 2022-05-09 DIAGNOSIS — J439 Emphysema, unspecified: Secondary | ICD-10-CM | POA: Diagnosis not present

## 2022-05-09 DIAGNOSIS — G309 Alzheimer's disease, unspecified: Secondary | ICD-10-CM | POA: Diagnosis not present

## 2022-05-09 DIAGNOSIS — L89891 Pressure ulcer of other site, stage 1: Secondary | ICD-10-CM | POA: Diagnosis not present

## 2022-05-10 DIAGNOSIS — L89626 Pressure-induced deep tissue damage of left heel: Secondary | ICD-10-CM | POA: Diagnosis not present

## 2022-05-10 DIAGNOSIS — J439 Emphysema, unspecified: Secondary | ICD-10-CM | POA: Diagnosis not present

## 2022-05-10 DIAGNOSIS — G309 Alzheimer's disease, unspecified: Secondary | ICD-10-CM | POA: Diagnosis not present

## 2022-05-10 DIAGNOSIS — L89891 Pressure ulcer of other site, stage 1: Secondary | ICD-10-CM | POA: Diagnosis not present

## 2022-05-10 DIAGNOSIS — I890 Lymphedema, not elsewhere classified: Secondary | ICD-10-CM | POA: Diagnosis not present

## 2022-05-10 DIAGNOSIS — F0280 Dementia in other diseases classified elsewhere without behavioral disturbance: Secondary | ICD-10-CM | POA: Diagnosis not present

## 2022-05-11 DIAGNOSIS — L89626 Pressure-induced deep tissue damage of left heel: Secondary | ICD-10-CM | POA: Diagnosis not present

## 2022-05-11 DIAGNOSIS — J439 Emphysema, unspecified: Secondary | ICD-10-CM | POA: Diagnosis not present

## 2022-05-11 DIAGNOSIS — G309 Alzheimer's disease, unspecified: Secondary | ICD-10-CM | POA: Diagnosis not present

## 2022-05-11 DIAGNOSIS — I890 Lymphedema, not elsewhere classified: Secondary | ICD-10-CM | POA: Diagnosis not present

## 2022-05-11 DIAGNOSIS — L89891 Pressure ulcer of other site, stage 1: Secondary | ICD-10-CM | POA: Diagnosis not present

## 2022-05-11 DIAGNOSIS — F0280 Dementia in other diseases classified elsewhere without behavioral disturbance: Secondary | ICD-10-CM | POA: Diagnosis not present

## 2022-05-16 DIAGNOSIS — G309 Alzheimer's disease, unspecified: Secondary | ICD-10-CM | POA: Diagnosis not present

## 2022-05-16 DIAGNOSIS — L89891 Pressure ulcer of other site, stage 1: Secondary | ICD-10-CM | POA: Diagnosis not present

## 2022-05-16 DIAGNOSIS — J439 Emphysema, unspecified: Secondary | ICD-10-CM | POA: Diagnosis not present

## 2022-05-16 DIAGNOSIS — F0280 Dementia in other diseases classified elsewhere without behavioral disturbance: Secondary | ICD-10-CM | POA: Diagnosis not present

## 2022-05-16 DIAGNOSIS — I890 Lymphedema, not elsewhere classified: Secondary | ICD-10-CM | POA: Diagnosis not present

## 2022-05-16 DIAGNOSIS — L89626 Pressure-induced deep tissue damage of left heel: Secondary | ICD-10-CM | POA: Diagnosis not present

## 2022-05-17 ENCOUNTER — Other Ambulatory Visit: Payer: Self-pay

## 2022-05-17 ENCOUNTER — Other Ambulatory Visit: Payer: Self-pay | Admitting: Family Medicine

## 2022-05-17 DIAGNOSIS — L89626 Pressure-induced deep tissue damage of left heel: Secondary | ICD-10-CM | POA: Diagnosis not present

## 2022-05-17 DIAGNOSIS — G309 Alzheimer's disease, unspecified: Secondary | ICD-10-CM | POA: Diagnosis not present

## 2022-05-17 DIAGNOSIS — J439 Emphysema, unspecified: Secondary | ICD-10-CM | POA: Diagnosis not present

## 2022-05-17 DIAGNOSIS — E039 Hypothyroidism, unspecified: Secondary | ICD-10-CM

## 2022-05-17 DIAGNOSIS — L89891 Pressure ulcer of other site, stage 1: Secondary | ICD-10-CM | POA: Diagnosis not present

## 2022-05-17 DIAGNOSIS — I890 Lymphedema, not elsewhere classified: Secondary | ICD-10-CM | POA: Diagnosis not present

## 2022-05-17 DIAGNOSIS — F0280 Dementia in other diseases classified elsewhere without behavioral disturbance: Secondary | ICD-10-CM | POA: Diagnosis not present

## 2022-05-17 MED ORDER — LEVOTHYROXINE SODIUM 100 MCG PO TABS: 100.0000 ug | ORAL_TABLET | Freq: Every day | ORAL | 0 refills | Status: DC

## 2022-05-18 DIAGNOSIS — F0280 Dementia in other diseases classified elsewhere without behavioral disturbance: Secondary | ICD-10-CM | POA: Diagnosis not present

## 2022-05-18 DIAGNOSIS — L89891 Pressure ulcer of other site, stage 1: Secondary | ICD-10-CM | POA: Diagnosis not present

## 2022-05-18 DIAGNOSIS — J439 Emphysema, unspecified: Secondary | ICD-10-CM | POA: Diagnosis not present

## 2022-05-18 DIAGNOSIS — G309 Alzheimer's disease, unspecified: Secondary | ICD-10-CM | POA: Diagnosis not present

## 2022-05-18 DIAGNOSIS — L89626 Pressure-induced deep tissue damage of left heel: Secondary | ICD-10-CM | POA: Diagnosis not present

## 2022-05-18 DIAGNOSIS — I890 Lymphedema, not elsewhere classified: Secondary | ICD-10-CM | POA: Diagnosis not present

## 2022-05-21 ENCOUNTER — Encounter: Payer: Self-pay | Admitting: Family Medicine

## 2022-05-21 ENCOUNTER — Telehealth (INDEPENDENT_AMBULATORY_CARE_PROVIDER_SITE_OTHER): Payer: Medicare Other | Admitting: Family Medicine

## 2022-05-21 VITALS — Ht 61.0 in

## 2022-05-21 DIAGNOSIS — E038 Other specified hypothyroidism: Secondary | ICD-10-CM

## 2022-05-21 DIAGNOSIS — L89891 Pressure ulcer of other site, stage 1: Secondary | ICD-10-CM | POA: Diagnosis not present

## 2022-05-21 DIAGNOSIS — G309 Alzheimer's disease, unspecified: Secondary | ICD-10-CM | POA: Diagnosis not present

## 2022-05-21 DIAGNOSIS — J439 Emphysema, unspecified: Secondary | ICD-10-CM | POA: Diagnosis not present

## 2022-05-21 DIAGNOSIS — I890 Lymphedema, not elsewhere classified: Secondary | ICD-10-CM | POA: Diagnosis not present

## 2022-05-21 DIAGNOSIS — F0280 Dementia in other diseases classified elsewhere without behavioral disturbance: Secondary | ICD-10-CM | POA: Diagnosis not present

## 2022-05-21 DIAGNOSIS — L89626 Pressure-induced deep tissue damage of left heel: Secondary | ICD-10-CM | POA: Diagnosis not present

## 2022-05-21 NOTE — Progress Notes (Signed)
Virtual Visit via Video Note  I connected with Victoria Sims on 05/21/22 at 11:40 AM EST by a video enabled telemedicine application and verified that I am speaking with the correct person using two identifiers.  Location: Patient: Home Provider: Clinic  I discussed the limitations, risks, security, and privacy concerns of performing an evaluation and management service by video and the availability of in person appointments. I also discussed with the patient that there may be a patient responsible charge related to this service. The patient expressed understanding and agreed to proceed.  Subjective: PCP: Victoria Monday, FNP  Chief Complaint  Patient presents with   Follow-up    3 month f/u.    HPI The patient has dementia, and her husband was the primary historian during this video visit.  The patient's husband reported the patient is bedridden and is unable to come in the clinic for labs.  Hypothyroidism: She takes Synthroid 100 mcg daily.  Denies aches and pain Anxiety: She takes Lexapro 40 mg daily, no reports of suicidal thoughts and ideation Hyperlipidemia: She takes rosuvastatin 20 mg daily and ezetimibe 10 mg daily.  No complaints or concerns voiced Gastric reflux: She takes Protonix 40 mg daily.  No complaints or concerns voiced Urge incontinence: She takes Wellbutrin 50 mg daily, no concerns voiced  ROS: Per HPI  Current Outpatient Medications:    aspirin 81 MG EC tablet, Take 81 mg by mouth daily., Disp: , Rfl: 99   Cholecalciferol (VITAMIN D-1000 MAX ST) 25 MCG (1000 UT) tablet, Take by mouth., Disp: , Rfl:    diclofenac Sodium (VOLTAREN) 1 % GEL, Apply 4 g topically at bedtime., Disp: 4 g, Rfl: 0   Docusate Sodium (DSS) 100 MG CAPS, Take by mouth., Disp: , Rfl:    doxycycline (VIBRA-TABS) 100 MG tablet, Take 1 tablet (100 mg total) by mouth 2 (two) times daily., Disp: 14 tablet, Rfl: 0   escitalopram (LEXAPRO) 20 MG tablet, Take 40 mg by mouth daily., Disp: ,  Rfl:    ezetimibe (ZETIA) 10 MG tablet, TAKE 1 TABLET BY MOUTH DAILY, Disp: 90 tablet, Rfl: 1   furosemide (LASIX) 20 MG tablet, TAKE 1 TABLET BY MOUTH EVERY DAY AS NEEDED FOR SWELLING NEED TO SCHEDULE VISIT WITH DR HILTY, Disp: 60 tablet, Rfl: 1   hydrOXYzine (VISTARIL) 25 MG capsule, TAKE ONE CAPSULE BY MOUTH EVERY 8 HOURS AS NEEDED, Disp: 60 capsule, Rfl: 4   levothyroxine (SYNTHROID) 100 MCG tablet, Take 1 tablet (100 mcg total) by mouth daily., Disp: 10 tablet, Rfl: 0   mirabegron ER (MYRBETRIQ) 50 MG TB24 tablet, Take 1 tablet (50 mg total) by mouth daily., Disp: 30 tablet, Rfl: 3   mirtazapine (REMERON) 15 MG tablet, TAKE 1 TABLET BY MOUTH AT BEDTIME, Disp: 30 tablet, Rfl: 5   pantoprazole (PROTONIX) 40 MG tablet, Take by mouth., Disp: , Rfl:    potassium chloride SA (KLOR-CON M) 20 MEQ tablet, TAKE 1 TABLET BY MOUTH ONCE A DAY WITH FOOD, Disp: 30 tablet, Rfl: 5   rosuvastatin (CRESTOR) 20 MG tablet, TAKE 1 TABLET BY MOUTH EVERY DAY, Disp: 90 tablet, Rfl: 3  Observations/Objective: Physical Exam  Assessment and Plan: Encouraged to continue treatment regiment The patient's husband noted that she does not need refills today Encourage the patient to return to clinic for labs   Follow Up Instructions: Return in about 3 months (around 08/20/2022).   I discussed the assessment and treatment plan with the patient. The patient was provided an  opportunity to ask questions, and all were answered. The patient agreed with the plan and demonstrated an understanding of the instructions.   The patient was advised to call back or seek an in-person evaluation if the symptoms worsen or if the condition fails to improve as anticipated.  The above assessment and management plan was discussed with the patient. The patient verbalized understanding of and has agreed to the management plan.   Victoria Monday, FNP

## 2022-05-24 DIAGNOSIS — L89891 Pressure ulcer of other site, stage 1: Secondary | ICD-10-CM | POA: Diagnosis not present

## 2022-05-24 DIAGNOSIS — G309 Alzheimer's disease, unspecified: Secondary | ICD-10-CM | POA: Diagnosis not present

## 2022-05-24 DIAGNOSIS — F0280 Dementia in other diseases classified elsewhere without behavioral disturbance: Secondary | ICD-10-CM | POA: Diagnosis not present

## 2022-05-24 DIAGNOSIS — L89626 Pressure-induced deep tissue damage of left heel: Secondary | ICD-10-CM | POA: Diagnosis not present

## 2022-05-24 DIAGNOSIS — J439 Emphysema, unspecified: Secondary | ICD-10-CM | POA: Diagnosis not present

## 2022-05-24 DIAGNOSIS — I890 Lymphedema, not elsewhere classified: Secondary | ICD-10-CM | POA: Diagnosis not present

## 2022-05-28 DIAGNOSIS — L89626 Pressure-induced deep tissue damage of left heel: Secondary | ICD-10-CM | POA: Diagnosis not present

## 2022-05-28 DIAGNOSIS — I890 Lymphedema, not elsewhere classified: Secondary | ICD-10-CM | POA: Diagnosis not present

## 2022-05-28 DIAGNOSIS — L89891 Pressure ulcer of other site, stage 1: Secondary | ICD-10-CM | POA: Diagnosis not present

## 2022-05-28 DIAGNOSIS — F0280 Dementia in other diseases classified elsewhere without behavioral disturbance: Secondary | ICD-10-CM | POA: Diagnosis not present

## 2022-05-28 DIAGNOSIS — G309 Alzheimer's disease, unspecified: Secondary | ICD-10-CM | POA: Diagnosis not present

## 2022-05-28 DIAGNOSIS — J439 Emphysema, unspecified: Secondary | ICD-10-CM | POA: Diagnosis not present

## 2022-05-29 DIAGNOSIS — F0280 Dementia in other diseases classified elsewhere without behavioral disturbance: Secondary | ICD-10-CM | POA: Diagnosis not present

## 2022-05-29 DIAGNOSIS — G309 Alzheimer's disease, unspecified: Secondary | ICD-10-CM | POA: Diagnosis not present

## 2022-05-29 DIAGNOSIS — L89626 Pressure-induced deep tissue damage of left heel: Secondary | ICD-10-CM | POA: Diagnosis not present

## 2022-05-29 DIAGNOSIS — J439 Emphysema, unspecified: Secondary | ICD-10-CM | POA: Diagnosis not present

## 2022-05-29 DIAGNOSIS — L89891 Pressure ulcer of other site, stage 1: Secondary | ICD-10-CM | POA: Diagnosis not present

## 2022-05-29 DIAGNOSIS — I890 Lymphedema, not elsewhere classified: Secondary | ICD-10-CM | POA: Diagnosis not present

## 2022-06-01 ENCOUNTER — Other Ambulatory Visit: Payer: Self-pay | Admitting: Family Medicine

## 2022-06-01 DIAGNOSIS — J439 Emphysema, unspecified: Secondary | ICD-10-CM | POA: Diagnosis not present

## 2022-06-01 DIAGNOSIS — L89891 Pressure ulcer of other site, stage 1: Secondary | ICD-10-CM | POA: Diagnosis not present

## 2022-06-01 DIAGNOSIS — F0280 Dementia in other diseases classified elsewhere without behavioral disturbance: Secondary | ICD-10-CM | POA: Diagnosis not present

## 2022-06-01 DIAGNOSIS — E876 Hypokalemia: Secondary | ICD-10-CM | POA: Diagnosis not present

## 2022-06-01 DIAGNOSIS — Z7401 Bed confinement status: Secondary | ICD-10-CM | POA: Diagnosis not present

## 2022-06-01 DIAGNOSIS — R911 Solitary pulmonary nodule: Secondary | ICD-10-CM | POA: Diagnosis not present

## 2022-06-01 DIAGNOSIS — N1832 Chronic kidney disease, stage 3b: Secondary | ICD-10-CM | POA: Diagnosis not present

## 2022-06-01 DIAGNOSIS — F17210 Nicotine dependence, cigarettes, uncomplicated: Secondary | ICD-10-CM | POA: Diagnosis not present

## 2022-06-01 DIAGNOSIS — E039 Hypothyroidism, unspecified: Secondary | ICD-10-CM | POA: Diagnosis not present

## 2022-06-01 DIAGNOSIS — G309 Alzheimer's disease, unspecified: Secondary | ICD-10-CM | POA: Diagnosis not present

## 2022-06-01 DIAGNOSIS — Z7982 Long term (current) use of aspirin: Secondary | ICD-10-CM | POA: Diagnosis not present

## 2022-06-01 DIAGNOSIS — I890 Lymphedema, not elsewhere classified: Secondary | ICD-10-CM | POA: Diagnosis not present

## 2022-06-01 DIAGNOSIS — L89626 Pressure-induced deep tissue damage of left heel: Secondary | ICD-10-CM | POA: Diagnosis not present

## 2022-06-05 DIAGNOSIS — F0280 Dementia in other diseases classified elsewhere without behavioral disturbance: Secondary | ICD-10-CM | POA: Diagnosis not present

## 2022-06-05 DIAGNOSIS — J439 Emphysema, unspecified: Secondary | ICD-10-CM | POA: Diagnosis not present

## 2022-06-05 DIAGNOSIS — G309 Alzheimer's disease, unspecified: Secondary | ICD-10-CM | POA: Diagnosis not present

## 2022-06-05 DIAGNOSIS — I890 Lymphedema, not elsewhere classified: Secondary | ICD-10-CM | POA: Diagnosis not present

## 2022-06-05 DIAGNOSIS — L89626 Pressure-induced deep tissue damage of left heel: Secondary | ICD-10-CM | POA: Diagnosis not present

## 2022-06-05 DIAGNOSIS — L89891 Pressure ulcer of other site, stage 1: Secondary | ICD-10-CM | POA: Diagnosis not present

## 2022-06-06 ENCOUNTER — Other Ambulatory Visit: Payer: Self-pay | Admitting: Internal Medicine

## 2022-06-06 DIAGNOSIS — L89626 Pressure-induced deep tissue damage of left heel: Secondary | ICD-10-CM | POA: Diagnosis not present

## 2022-06-06 DIAGNOSIS — L89891 Pressure ulcer of other site, stage 1: Secondary | ICD-10-CM | POA: Diagnosis not present

## 2022-06-06 DIAGNOSIS — F0280 Dementia in other diseases classified elsewhere without behavioral disturbance: Secondary | ICD-10-CM | POA: Diagnosis not present

## 2022-06-06 DIAGNOSIS — G309 Alzheimer's disease, unspecified: Secondary | ICD-10-CM | POA: Diagnosis not present

## 2022-06-06 DIAGNOSIS — I890 Lymphedema, not elsewhere classified: Secondary | ICD-10-CM | POA: Diagnosis not present

## 2022-06-06 DIAGNOSIS — J439 Emphysema, unspecified: Secondary | ICD-10-CM | POA: Diagnosis not present

## 2022-06-08 DIAGNOSIS — G309 Alzheimer's disease, unspecified: Secondary | ICD-10-CM | POA: Diagnosis not present

## 2022-06-08 DIAGNOSIS — J439 Emphysema, unspecified: Secondary | ICD-10-CM | POA: Diagnosis not present

## 2022-06-08 DIAGNOSIS — F0280 Dementia in other diseases classified elsewhere without behavioral disturbance: Secondary | ICD-10-CM | POA: Diagnosis not present

## 2022-06-08 DIAGNOSIS — L89891 Pressure ulcer of other site, stage 1: Secondary | ICD-10-CM | POA: Diagnosis not present

## 2022-06-08 DIAGNOSIS — L89626 Pressure-induced deep tissue damage of left heel: Secondary | ICD-10-CM | POA: Diagnosis not present

## 2022-06-08 DIAGNOSIS — I890 Lymphedema, not elsewhere classified: Secondary | ICD-10-CM | POA: Diagnosis not present

## 2022-06-13 ENCOUNTER — Other Ambulatory Visit: Payer: Self-pay | Admitting: Family Medicine

## 2022-06-13 DIAGNOSIS — J439 Emphysema, unspecified: Secondary | ICD-10-CM | POA: Diagnosis not present

## 2022-06-13 DIAGNOSIS — F0280 Dementia in other diseases classified elsewhere without behavioral disturbance: Secondary | ICD-10-CM | POA: Diagnosis not present

## 2022-06-13 DIAGNOSIS — G309 Alzheimer's disease, unspecified: Secondary | ICD-10-CM | POA: Diagnosis not present

## 2022-06-13 DIAGNOSIS — L89891 Pressure ulcer of other site, stage 1: Secondary | ICD-10-CM | POA: Diagnosis not present

## 2022-06-13 DIAGNOSIS — Z515 Encounter for palliative care: Secondary | ICD-10-CM

## 2022-06-13 DIAGNOSIS — I890 Lymphedema, not elsewhere classified: Secondary | ICD-10-CM | POA: Diagnosis not present

## 2022-06-13 DIAGNOSIS — E038 Other specified hypothyroidism: Secondary | ICD-10-CM

## 2022-06-13 DIAGNOSIS — L89626 Pressure-induced deep tissue damage of left heel: Secondary | ICD-10-CM | POA: Diagnosis not present

## 2022-06-13 DIAGNOSIS — E785 Hyperlipidemia, unspecified: Secondary | ICD-10-CM

## 2022-06-13 NOTE — Telephone Encounter (Signed)
Ref placed to [facility]

## 2022-06-14 ENCOUNTER — Other Ambulatory Visit: Payer: Self-pay | Admitting: Family Medicine

## 2022-06-14 ENCOUNTER — Telehealth: Payer: Self-pay

## 2022-06-14 DIAGNOSIS — E039 Hypothyroidism, unspecified: Secondary | ICD-10-CM

## 2022-06-14 DIAGNOSIS — L89891 Pressure ulcer of other site, stage 1: Secondary | ICD-10-CM | POA: Diagnosis not present

## 2022-06-14 DIAGNOSIS — J439 Emphysema, unspecified: Secondary | ICD-10-CM | POA: Diagnosis not present

## 2022-06-14 DIAGNOSIS — I890 Lymphedema, not elsewhere classified: Secondary | ICD-10-CM | POA: Diagnosis not present

## 2022-06-14 DIAGNOSIS — G309 Alzheimer's disease, unspecified: Secondary | ICD-10-CM | POA: Diagnosis not present

## 2022-06-14 DIAGNOSIS — F0280 Dementia in other diseases classified elsewhere without behavioral disturbance: Secondary | ICD-10-CM | POA: Diagnosis not present

## 2022-06-14 DIAGNOSIS — L89626 Pressure-induced deep tissue damage of left heel: Secondary | ICD-10-CM | POA: Diagnosis not present

## 2022-06-14 NOTE — Telephone Encounter (Signed)
   Telephone encounter was:  Successful.  06/14/2022 Name: Victoria Sims MRN: [id number] DOB: 1941/01/31  Victoria Sims is a 82 y.o. year old female who is a primary care patient of Alvira Monday, [employer] . The community resource team was consulted for assistance with Transportation Needs   Care guide performed the following interventions: Spoke wih patient's spouse [name] about sending a [id number] referral to [place] transportation. Mr. [name] consented to referral being sent. Informed Mr. [name] that someone from [facility] should contact him in the next few days.  Follow Up Plan:  No further follow up planned at this time. The patient has been provided with needed resources.  [place] Resource Care Guide   ??[name]  ?? [phone]   Website: [email or web address]  [email or web address]

## 2022-06-15 DIAGNOSIS — G309 Alzheimer's disease, unspecified: Secondary | ICD-10-CM | POA: Diagnosis not present

## 2022-06-15 DIAGNOSIS — L89891 Pressure ulcer of other site, stage 1: Secondary | ICD-10-CM | POA: Diagnosis not present

## 2022-06-15 DIAGNOSIS — I890 Lymphedema, not elsewhere classified: Secondary | ICD-10-CM | POA: Diagnosis not present

## 2022-06-15 DIAGNOSIS — J439 Emphysema, unspecified: Secondary | ICD-10-CM | POA: Diagnosis not present

## 2022-06-15 DIAGNOSIS — L89626 Pressure-induced deep tissue damage of left heel: Secondary | ICD-10-CM | POA: Diagnosis not present

## 2022-06-15 DIAGNOSIS — F0280 Dementia in other diseases classified elsewhere without behavioral disturbance: Secondary | ICD-10-CM | POA: Diagnosis not present

## 2022-06-19 DIAGNOSIS — G309 Alzheimer's disease, unspecified: Secondary | ICD-10-CM | POA: Diagnosis not present

## 2022-06-19 DIAGNOSIS — J439 Emphysema, unspecified: Secondary | ICD-10-CM | POA: Diagnosis not present

## 2022-06-19 DIAGNOSIS — I890 Lymphedema, not elsewhere classified: Secondary | ICD-10-CM | POA: Diagnosis not present

## 2022-06-19 DIAGNOSIS — L89626 Pressure-induced deep tissue damage of left heel: Secondary | ICD-10-CM | POA: Diagnosis not present

## 2022-06-19 DIAGNOSIS — L89891 Pressure ulcer of other site, stage 1: Secondary | ICD-10-CM | POA: Diagnosis not present

## 2022-06-19 DIAGNOSIS — F0280 Dementia in other diseases classified elsewhere without behavioral disturbance: Secondary | ICD-10-CM | POA: Diagnosis not present

## 2022-06-21 DIAGNOSIS — J439 Emphysema, unspecified: Secondary | ICD-10-CM | POA: Diagnosis not present

## 2022-06-21 DIAGNOSIS — L89891 Pressure ulcer of other site, stage 1: Secondary | ICD-10-CM | POA: Diagnosis not present

## 2022-06-21 DIAGNOSIS — F0280 Dementia in other diseases classified elsewhere without behavioral disturbance: Secondary | ICD-10-CM | POA: Diagnosis not present

## 2022-06-21 DIAGNOSIS — G309 Alzheimer's disease, unspecified: Secondary | ICD-10-CM | POA: Diagnosis not present

## 2022-06-21 DIAGNOSIS — L89626 Pressure-induced deep tissue damage of left heel: Secondary | ICD-10-CM | POA: Diagnosis not present

## 2022-06-21 DIAGNOSIS — I890 Lymphedema, not elsewhere classified: Secondary | ICD-10-CM | POA: Diagnosis not present

## 2022-06-27 DIAGNOSIS — J439 Emphysema, unspecified: Secondary | ICD-10-CM | POA: Diagnosis not present

## 2022-06-27 DIAGNOSIS — G309 Alzheimer's disease, unspecified: Secondary | ICD-10-CM | POA: Diagnosis not present

## 2022-06-27 DIAGNOSIS — F0280 Dementia in other diseases classified elsewhere without behavioral disturbance: Secondary | ICD-10-CM | POA: Diagnosis not present

## 2022-06-27 DIAGNOSIS — L89891 Pressure ulcer of other site, stage 1: Secondary | ICD-10-CM | POA: Diagnosis not present

## 2022-06-27 DIAGNOSIS — L89626 Pressure-induced deep tissue damage of left heel: Secondary | ICD-10-CM | POA: Diagnosis not present

## 2022-06-27 DIAGNOSIS — I890 Lymphedema, not elsewhere classified: Secondary | ICD-10-CM | POA: Diagnosis not present

## 2022-06-29 DIAGNOSIS — G309 Alzheimer's disease, unspecified: Secondary | ICD-10-CM | POA: Diagnosis not present

## 2022-06-29 DIAGNOSIS — L89891 Pressure ulcer of other site, stage 1: Secondary | ICD-10-CM | POA: Diagnosis not present

## 2022-06-29 DIAGNOSIS — I890 Lymphedema, not elsewhere classified: Secondary | ICD-10-CM | POA: Diagnosis not present

## 2022-06-29 DIAGNOSIS — J439 Emphysema, unspecified: Secondary | ICD-10-CM | POA: Diagnosis not present

## 2022-06-29 DIAGNOSIS — L89626 Pressure-induced deep tissue damage of left heel: Secondary | ICD-10-CM | POA: Diagnosis not present

## 2022-06-29 DIAGNOSIS — F0280 Dementia in other diseases classified elsewhere without behavioral disturbance: Secondary | ICD-10-CM | POA: Diagnosis not present

## 2022-07-01 DIAGNOSIS — Z7982 Long term (current) use of aspirin: Secondary | ICD-10-CM | POA: Diagnosis not present

## 2022-07-01 DIAGNOSIS — L89626 Pressure-induced deep tissue damage of left heel: Secondary | ICD-10-CM | POA: Diagnosis not present

## 2022-07-01 DIAGNOSIS — R911 Solitary pulmonary nodule: Secondary | ICD-10-CM | POA: Diagnosis not present

## 2022-07-01 DIAGNOSIS — F0280 Dementia in other diseases classified elsewhere without behavioral disturbance: Secondary | ICD-10-CM | POA: Diagnosis not present

## 2022-07-01 DIAGNOSIS — I890 Lymphedema, not elsewhere classified: Secondary | ICD-10-CM | POA: Diagnosis not present

## 2022-07-01 DIAGNOSIS — E876 Hypokalemia: Secondary | ICD-10-CM | POA: Diagnosis not present

## 2022-07-01 DIAGNOSIS — G309 Alzheimer's disease, unspecified: Secondary | ICD-10-CM | POA: Diagnosis not present

## 2022-07-01 DIAGNOSIS — E039 Hypothyroidism, unspecified: Secondary | ICD-10-CM | POA: Diagnosis not present

## 2022-07-01 DIAGNOSIS — N1832 Chronic kidney disease, stage 3b: Secondary | ICD-10-CM | POA: Diagnosis not present

## 2022-07-01 DIAGNOSIS — Z7401 Bed confinement status: Secondary | ICD-10-CM | POA: Diagnosis not present

## 2022-07-01 DIAGNOSIS — F17210 Nicotine dependence, cigarettes, uncomplicated: Secondary | ICD-10-CM | POA: Diagnosis not present

## 2022-07-01 DIAGNOSIS — J439 Emphysema, unspecified: Secondary | ICD-10-CM | POA: Diagnosis not present

## 2022-07-02 DIAGNOSIS — E039 Hypothyroidism, unspecified: Secondary | ICD-10-CM | POA: Diagnosis not present

## 2022-07-02 DIAGNOSIS — J439 Emphysema, unspecified: Secondary | ICD-10-CM | POA: Diagnosis not present

## 2022-07-02 DIAGNOSIS — I890 Lymphedema, not elsewhere classified: Secondary | ICD-10-CM | POA: Diagnosis not present

## 2022-07-02 DIAGNOSIS — L89626 Pressure-induced deep tissue damage of left heel: Secondary | ICD-10-CM | POA: Diagnosis not present

## 2022-07-02 DIAGNOSIS — F0280 Dementia in other diseases classified elsewhere without behavioral disturbance: Secondary | ICD-10-CM | POA: Diagnosis not present

## 2022-07-02 DIAGNOSIS — G309 Alzheimer's disease, unspecified: Secondary | ICD-10-CM | POA: Diagnosis not present

## 2022-07-04 DIAGNOSIS — F0280 Dementia in other diseases classified elsewhere without behavioral disturbance: Secondary | ICD-10-CM | POA: Diagnosis not present

## 2022-07-04 DIAGNOSIS — G309 Alzheimer's disease, unspecified: Secondary | ICD-10-CM | POA: Diagnosis not present

## 2022-07-04 DIAGNOSIS — I890 Lymphedema, not elsewhere classified: Secondary | ICD-10-CM | POA: Diagnosis not present

## 2022-07-04 DIAGNOSIS — E039 Hypothyroidism, unspecified: Secondary | ICD-10-CM | POA: Diagnosis not present

## 2022-07-04 DIAGNOSIS — L89626 Pressure-induced deep tissue damage of left heel: Secondary | ICD-10-CM | POA: Diagnosis not present

## 2022-07-04 DIAGNOSIS — J439 Emphysema, unspecified: Secondary | ICD-10-CM | POA: Diagnosis not present

## 2022-07-06 DIAGNOSIS — L89626 Pressure-induced deep tissue damage of left heel: Secondary | ICD-10-CM | POA: Diagnosis not present

## 2022-07-06 DIAGNOSIS — F0280 Dementia in other diseases classified elsewhere without behavioral disturbance: Secondary | ICD-10-CM | POA: Diagnosis not present

## 2022-07-06 DIAGNOSIS — J439 Emphysema, unspecified: Secondary | ICD-10-CM | POA: Diagnosis not present

## 2022-07-06 DIAGNOSIS — G309 Alzheimer's disease, unspecified: Secondary | ICD-10-CM | POA: Diagnosis not present

## 2022-07-06 DIAGNOSIS — I890 Lymphedema, not elsewhere classified: Secondary | ICD-10-CM | POA: Diagnosis not present

## 2022-07-06 DIAGNOSIS — E039 Hypothyroidism, unspecified: Secondary | ICD-10-CM | POA: Diagnosis not present

## 2022-07-08 ENCOUNTER — Other Ambulatory Visit: Payer: Self-pay | Admitting: Family Medicine

## 2022-07-08 DIAGNOSIS — E039 Hypothyroidism, unspecified: Secondary | ICD-10-CM

## 2022-07-09 DIAGNOSIS — I890 Lymphedema, not elsewhere classified: Secondary | ICD-10-CM | POA: Diagnosis not present

## 2022-07-09 DIAGNOSIS — J439 Emphysema, unspecified: Secondary | ICD-10-CM | POA: Diagnosis not present

## 2022-07-09 DIAGNOSIS — F0280 Dementia in other diseases classified elsewhere without behavioral disturbance: Secondary | ICD-10-CM | POA: Diagnosis not present

## 2022-07-09 DIAGNOSIS — G309 Alzheimer's disease, unspecified: Secondary | ICD-10-CM | POA: Diagnosis not present

## 2022-07-09 DIAGNOSIS — E039 Hypothyroidism, unspecified: Secondary | ICD-10-CM | POA: Diagnosis not present

## 2022-07-09 DIAGNOSIS — L89626 Pressure-induced deep tissue damage of left heel: Secondary | ICD-10-CM | POA: Diagnosis not present

## 2022-07-10 DIAGNOSIS — F0280 Dementia in other diseases classified elsewhere without behavioral disturbance: Secondary | ICD-10-CM | POA: Diagnosis not present

## 2022-07-10 DIAGNOSIS — L89626 Pressure-induced deep tissue damage of left heel: Secondary | ICD-10-CM | POA: Diagnosis not present

## 2022-07-10 DIAGNOSIS — J439 Emphysema, unspecified: Secondary | ICD-10-CM | POA: Diagnosis not present

## 2022-07-10 DIAGNOSIS — G309 Alzheimer's disease, unspecified: Secondary | ICD-10-CM | POA: Diagnosis not present

## 2022-07-10 DIAGNOSIS — I890 Lymphedema, not elsewhere classified: Secondary | ICD-10-CM | POA: Diagnosis not present

## 2022-07-10 DIAGNOSIS — E039 Hypothyroidism, unspecified: Secondary | ICD-10-CM | POA: Diagnosis not present

## 2022-07-12 DIAGNOSIS — L89626 Pressure-induced deep tissue damage of left heel: Secondary | ICD-10-CM | POA: Diagnosis not present

## 2022-07-12 DIAGNOSIS — G309 Alzheimer's disease, unspecified: Secondary | ICD-10-CM | POA: Diagnosis not present

## 2022-07-12 DIAGNOSIS — E039 Hypothyroidism, unspecified: Secondary | ICD-10-CM | POA: Diagnosis not present

## 2022-07-12 DIAGNOSIS — J439 Emphysema, unspecified: Secondary | ICD-10-CM | POA: Diagnosis not present

## 2022-07-12 DIAGNOSIS — F0280 Dementia in other diseases classified elsewhere without behavioral disturbance: Secondary | ICD-10-CM | POA: Diagnosis not present

## 2022-07-12 DIAGNOSIS — I890 Lymphedema, not elsewhere classified: Secondary | ICD-10-CM | POA: Diagnosis not present

## 2022-07-17 DIAGNOSIS — F0280 Dementia in other diseases classified elsewhere without behavioral disturbance: Secondary | ICD-10-CM | POA: Diagnosis not present

## 2022-07-17 DIAGNOSIS — J439 Emphysema, unspecified: Secondary | ICD-10-CM | POA: Diagnosis not present

## 2022-07-17 DIAGNOSIS — L89626 Pressure-induced deep tissue damage of left heel: Secondary | ICD-10-CM | POA: Diagnosis not present

## 2022-07-17 DIAGNOSIS — G309 Alzheimer's disease, unspecified: Secondary | ICD-10-CM | POA: Diagnosis not present

## 2022-07-17 DIAGNOSIS — E039 Hypothyroidism, unspecified: Secondary | ICD-10-CM | POA: Diagnosis not present

## 2022-07-17 DIAGNOSIS — I890 Lymphedema, not elsewhere classified: Secondary | ICD-10-CM | POA: Diagnosis not present

## 2022-07-20 DIAGNOSIS — E039 Hypothyroidism, unspecified: Secondary | ICD-10-CM | POA: Diagnosis not present

## 2022-07-20 DIAGNOSIS — F0280 Dementia in other diseases classified elsewhere without behavioral disturbance: Secondary | ICD-10-CM | POA: Diagnosis not present

## 2022-07-20 DIAGNOSIS — J439 Emphysema, unspecified: Secondary | ICD-10-CM | POA: Diagnosis not present

## 2022-07-20 DIAGNOSIS — I890 Lymphedema, not elsewhere classified: Secondary | ICD-10-CM | POA: Diagnosis not present

## 2022-07-20 DIAGNOSIS — G309 Alzheimer's disease, unspecified: Secondary | ICD-10-CM | POA: Diagnosis not present

## 2022-07-20 DIAGNOSIS — L89626 Pressure-induced deep tissue damage of left heel: Secondary | ICD-10-CM | POA: Diagnosis not present

## 2022-07-24 ENCOUNTER — Other Ambulatory Visit: Payer: Self-pay | Admitting: Family Medicine

## 2022-07-24 DIAGNOSIS — G309 Alzheimer's disease, unspecified: Secondary | ICD-10-CM | POA: Diagnosis not present

## 2022-07-24 DIAGNOSIS — I890 Lymphedema, not elsewhere classified: Secondary | ICD-10-CM | POA: Diagnosis not present

## 2022-07-24 DIAGNOSIS — F0280 Dementia in other diseases classified elsewhere without behavioral disturbance: Secondary | ICD-10-CM | POA: Diagnosis not present

## 2022-07-24 DIAGNOSIS — L89626 Pressure-induced deep tissue damage of left heel: Secondary | ICD-10-CM | POA: Diagnosis not present

## 2022-07-24 DIAGNOSIS — E039 Hypothyroidism, unspecified: Secondary | ICD-10-CM | POA: Diagnosis not present

## 2022-07-24 DIAGNOSIS — J439 Emphysema, unspecified: Secondary | ICD-10-CM | POA: Diagnosis not present

## 2022-07-27 DIAGNOSIS — L89626 Pressure-induced deep tissue damage of left heel: Secondary | ICD-10-CM | POA: Diagnosis not present

## 2022-07-27 DIAGNOSIS — E039 Hypothyroidism, unspecified: Secondary | ICD-10-CM | POA: Diagnosis not present

## 2022-07-27 DIAGNOSIS — G309 Alzheimer's disease, unspecified: Secondary | ICD-10-CM | POA: Diagnosis not present

## 2022-07-27 DIAGNOSIS — I890 Lymphedema, not elsewhere classified: Secondary | ICD-10-CM | POA: Diagnosis not present

## 2022-07-27 DIAGNOSIS — F0280 Dementia in other diseases classified elsewhere without behavioral disturbance: Secondary | ICD-10-CM | POA: Diagnosis not present

## 2022-07-27 DIAGNOSIS — J439 Emphysema, unspecified: Secondary | ICD-10-CM | POA: Diagnosis not present

## 2022-07-29 ENCOUNTER — Other Ambulatory Visit: Payer: Self-pay | Admitting: Family Medicine

## 2022-07-29 DIAGNOSIS — F419 Anxiety disorder, unspecified: Secondary | ICD-10-CM

## 2022-07-30 DIAGNOSIS — J439 Emphysema, unspecified: Secondary | ICD-10-CM | POA: Diagnosis not present

## 2022-07-30 DIAGNOSIS — G309 Alzheimer's disease, unspecified: Secondary | ICD-10-CM | POA: Diagnosis not present

## 2022-07-30 DIAGNOSIS — F0280 Dementia in other diseases classified elsewhere without behavioral disturbance: Secondary | ICD-10-CM | POA: Diagnosis not present

## 2022-07-30 DIAGNOSIS — E039 Hypothyroidism, unspecified: Secondary | ICD-10-CM | POA: Diagnosis not present

## 2022-07-30 DIAGNOSIS — L89626 Pressure-induced deep tissue damage of left heel: Secondary | ICD-10-CM | POA: Diagnosis not present

## 2022-07-30 DIAGNOSIS — I890 Lymphedema, not elsewhere classified: Secondary | ICD-10-CM | POA: Diagnosis not present

## 2022-07-31 DIAGNOSIS — E876 Hypokalemia: Secondary | ICD-10-CM | POA: Diagnosis not present

## 2022-07-31 DIAGNOSIS — I890 Lymphedema, not elsewhere classified: Secondary | ICD-10-CM | POA: Diagnosis not present

## 2022-07-31 DIAGNOSIS — N1832 Chronic kidney disease, stage 3b: Secondary | ICD-10-CM | POA: Diagnosis not present

## 2022-07-31 DIAGNOSIS — F0280 Dementia in other diseases classified elsewhere without behavioral disturbance: Secondary | ICD-10-CM | POA: Diagnosis not present

## 2022-07-31 DIAGNOSIS — Z7982 Long term (current) use of aspirin: Secondary | ICD-10-CM | POA: Diagnosis not present

## 2022-07-31 DIAGNOSIS — F17210 Nicotine dependence, cigarettes, uncomplicated: Secondary | ICD-10-CM | POA: Diagnosis not present

## 2022-07-31 DIAGNOSIS — Z7401 Bed confinement status: Secondary | ICD-10-CM | POA: Diagnosis not present

## 2022-07-31 DIAGNOSIS — E039 Hypothyroidism, unspecified: Secondary | ICD-10-CM | POA: Diagnosis not present

## 2022-07-31 DIAGNOSIS — G309 Alzheimer's disease, unspecified: Secondary | ICD-10-CM | POA: Diagnosis not present

## 2022-07-31 DIAGNOSIS — L89626 Pressure-induced deep tissue damage of left heel: Secondary | ICD-10-CM | POA: Diagnosis not present

## 2022-07-31 DIAGNOSIS — R911 Solitary pulmonary nodule: Secondary | ICD-10-CM | POA: Diagnosis not present

## 2022-07-31 DIAGNOSIS — J439 Emphysema, unspecified: Secondary | ICD-10-CM | POA: Diagnosis not present

## 2022-08-02 DIAGNOSIS — I890 Lymphedema, not elsewhere classified: Secondary | ICD-10-CM | POA: Diagnosis not present

## 2022-08-02 DIAGNOSIS — J439 Emphysema, unspecified: Secondary | ICD-10-CM | POA: Diagnosis not present

## 2022-08-02 DIAGNOSIS — G309 Alzheimer's disease, unspecified: Secondary | ICD-10-CM | POA: Diagnosis not present

## 2022-08-02 DIAGNOSIS — F0280 Dementia in other diseases classified elsewhere without behavioral disturbance: Secondary | ICD-10-CM | POA: Diagnosis not present

## 2022-08-02 DIAGNOSIS — L89626 Pressure-induced deep tissue damage of left heel: Secondary | ICD-10-CM | POA: Diagnosis not present

## 2022-08-02 DIAGNOSIS — E039 Hypothyroidism, unspecified: Secondary | ICD-10-CM | POA: Diagnosis not present

## 2022-08-07 DIAGNOSIS — F0280 Dementia in other diseases classified elsewhere without behavioral disturbance: Secondary | ICD-10-CM | POA: Diagnosis not present

## 2022-08-07 DIAGNOSIS — I890 Lymphedema, not elsewhere classified: Secondary | ICD-10-CM | POA: Diagnosis not present

## 2022-08-07 DIAGNOSIS — E039 Hypothyroidism, unspecified: Secondary | ICD-10-CM | POA: Diagnosis not present

## 2022-08-07 DIAGNOSIS — J439 Emphysema, unspecified: Secondary | ICD-10-CM | POA: Diagnosis not present

## 2022-08-07 DIAGNOSIS — G309 Alzheimer's disease, unspecified: Secondary | ICD-10-CM | POA: Diagnosis not present

## 2022-08-07 DIAGNOSIS — L89626 Pressure-induced deep tissue damage of left heel: Secondary | ICD-10-CM | POA: Diagnosis not present

## 2022-08-08 ENCOUNTER — Other Ambulatory Visit: Payer: Self-pay

## 2022-08-08 ENCOUNTER — Other Ambulatory Visit: Payer: Self-pay | Admitting: Family Medicine

## 2022-08-08 ENCOUNTER — Other Ambulatory Visit: Payer: Self-pay | Admitting: Internal Medicine

## 2022-08-08 DIAGNOSIS — E039 Hypothyroidism, unspecified: Secondary | ICD-10-CM

## 2022-08-10 DIAGNOSIS — G309 Alzheimer's disease, unspecified: Secondary | ICD-10-CM | POA: Diagnosis not present

## 2022-08-10 DIAGNOSIS — I890 Lymphedema, not elsewhere classified: Secondary | ICD-10-CM | POA: Diagnosis not present

## 2022-08-10 DIAGNOSIS — L89626 Pressure-induced deep tissue damage of left heel: Secondary | ICD-10-CM | POA: Diagnosis not present

## 2022-08-10 DIAGNOSIS — E039 Hypothyroidism, unspecified: Secondary | ICD-10-CM | POA: Diagnosis not present

## 2022-08-10 DIAGNOSIS — F0280 Dementia in other diseases classified elsewhere without behavioral disturbance: Secondary | ICD-10-CM | POA: Diagnosis not present

## 2022-08-10 DIAGNOSIS — J439 Emphysema, unspecified: Secondary | ICD-10-CM | POA: Diagnosis not present

## 2022-08-13 DIAGNOSIS — E039 Hypothyroidism, unspecified: Secondary | ICD-10-CM | POA: Diagnosis not present

## 2022-08-13 DIAGNOSIS — I890 Lymphedema, not elsewhere classified: Secondary | ICD-10-CM | POA: Diagnosis not present

## 2022-08-13 DIAGNOSIS — L89626 Pressure-induced deep tissue damage of left heel: Secondary | ICD-10-CM | POA: Diagnosis not present

## 2022-08-13 DIAGNOSIS — J439 Emphysema, unspecified: Secondary | ICD-10-CM | POA: Diagnosis not present

## 2022-08-13 DIAGNOSIS — G309 Alzheimer's disease, unspecified: Secondary | ICD-10-CM | POA: Diagnosis not present

## 2022-08-13 DIAGNOSIS — F0280 Dementia in other diseases classified elsewhere without behavioral disturbance: Secondary | ICD-10-CM | POA: Diagnosis not present

## 2022-08-15 DIAGNOSIS — J439 Emphysema, unspecified: Secondary | ICD-10-CM | POA: Diagnosis not present

## 2022-08-15 DIAGNOSIS — E039 Hypothyroidism, unspecified: Secondary | ICD-10-CM | POA: Diagnosis not present

## 2022-08-15 DIAGNOSIS — I890 Lymphedema, not elsewhere classified: Secondary | ICD-10-CM | POA: Diagnosis not present

## 2022-08-15 DIAGNOSIS — F0280 Dementia in other diseases classified elsewhere without behavioral disturbance: Secondary | ICD-10-CM | POA: Diagnosis not present

## 2022-08-15 DIAGNOSIS — G309 Alzheimer's disease, unspecified: Secondary | ICD-10-CM | POA: Diagnosis not present

## 2022-08-15 DIAGNOSIS — L89626 Pressure-induced deep tissue damage of left heel: Secondary | ICD-10-CM | POA: Diagnosis not present

## 2022-08-16 ENCOUNTER — Other Ambulatory Visit: Payer: Self-pay | Admitting: Family Medicine

## 2022-08-16 DIAGNOSIS — E039 Hypothyroidism, unspecified: Secondary | ICD-10-CM

## 2022-08-21 DIAGNOSIS — E039 Hypothyroidism, unspecified: Secondary | ICD-10-CM | POA: Diagnosis not present

## 2022-08-21 DIAGNOSIS — I890 Lymphedema, not elsewhere classified: Secondary | ICD-10-CM | POA: Diagnosis not present

## 2022-08-21 DIAGNOSIS — F0280 Dementia in other diseases classified elsewhere without behavioral disturbance: Secondary | ICD-10-CM | POA: Diagnosis not present

## 2022-08-21 DIAGNOSIS — G309 Alzheimer's disease, unspecified: Secondary | ICD-10-CM | POA: Diagnosis not present

## 2022-08-21 DIAGNOSIS — L89626 Pressure-induced deep tissue damage of left heel: Secondary | ICD-10-CM | POA: Diagnosis not present

## 2022-08-21 DIAGNOSIS — J439 Emphysema, unspecified: Secondary | ICD-10-CM | POA: Diagnosis not present

## 2022-08-23 DIAGNOSIS — I890 Lymphedema, not elsewhere classified: Secondary | ICD-10-CM | POA: Diagnosis not present

## 2022-08-23 DIAGNOSIS — G309 Alzheimer's disease, unspecified: Secondary | ICD-10-CM | POA: Diagnosis not present

## 2022-08-23 DIAGNOSIS — J439 Emphysema, unspecified: Secondary | ICD-10-CM | POA: Diagnosis not present

## 2022-08-23 DIAGNOSIS — F0280 Dementia in other diseases classified elsewhere without behavioral disturbance: Secondary | ICD-10-CM | POA: Diagnosis not present

## 2022-08-23 DIAGNOSIS — L89626 Pressure-induced deep tissue damage of left heel: Secondary | ICD-10-CM | POA: Diagnosis not present

## 2022-08-23 DIAGNOSIS — E039 Hypothyroidism, unspecified: Secondary | ICD-10-CM | POA: Diagnosis not present

## 2022-08-24 DIAGNOSIS — J439 Emphysema, unspecified: Secondary | ICD-10-CM | POA: Diagnosis not present

## 2022-08-24 DIAGNOSIS — E039 Hypothyroidism, unspecified: Secondary | ICD-10-CM | POA: Diagnosis not present

## 2022-08-24 DIAGNOSIS — I890 Lymphedema, not elsewhere classified: Secondary | ICD-10-CM | POA: Diagnosis not present

## 2022-08-24 DIAGNOSIS — G309 Alzheimer's disease, unspecified: Secondary | ICD-10-CM | POA: Diagnosis not present

## 2022-08-24 DIAGNOSIS — F0280 Dementia in other diseases classified elsewhere without behavioral disturbance: Secondary | ICD-10-CM | POA: Diagnosis not present

## 2022-08-24 DIAGNOSIS — L89626 Pressure-induced deep tissue damage of left heel: Secondary | ICD-10-CM | POA: Diagnosis not present

## 2022-08-27 DIAGNOSIS — G309 Alzheimer's disease, unspecified: Secondary | ICD-10-CM | POA: Diagnosis not present

## 2022-08-27 DIAGNOSIS — E039 Hypothyroidism, unspecified: Secondary | ICD-10-CM | POA: Diagnosis not present

## 2022-08-27 DIAGNOSIS — F0280 Dementia in other diseases classified elsewhere without behavioral disturbance: Secondary | ICD-10-CM | POA: Diagnosis not present

## 2022-08-27 DIAGNOSIS — J439 Emphysema, unspecified: Secondary | ICD-10-CM | POA: Diagnosis not present

## 2022-08-27 DIAGNOSIS — L89626 Pressure-induced deep tissue damage of left heel: Secondary | ICD-10-CM | POA: Diagnosis not present

## 2022-08-27 DIAGNOSIS — I890 Lymphedema, not elsewhere classified: Secondary | ICD-10-CM | POA: Diagnosis not present

## 2022-08-28 ENCOUNTER — Telehealth: Payer: Self-pay | Admitting: Family Medicine

## 2022-08-28 ENCOUNTER — Other Ambulatory Visit: Payer: Self-pay

## 2022-08-28 DIAGNOSIS — E039 Hypothyroidism, unspecified: Secondary | ICD-10-CM

## 2022-08-28 MED ORDER — LEVOTHYROXINE SODIUM 100 MCG PO TABS
100.0000 ug | ORAL_TABLET | Freq: Every day | ORAL | 0 refills | Status: DC
Start: 2022-08-28 — End: 2022-09-24

## 2022-08-28 MED ORDER — EZETIMIBE 10 MG PO TABS: 10.0000 mg | ORAL_TABLET | Freq: Every day | ORAL | 1 refills | Status: DC

## 2022-08-28 MED ORDER — ESCITALOPRAM OXALATE 20 MG PO TABS: 40.0000 mg | ORAL_TABLET | Freq: Every day | ORAL | 0 refills | Status: DC

## 2022-08-28 NOTE — Telephone Encounter (Signed)
Patient need med refill  levothyroxine (SYNTHROID) 100 MCG tablet [[id number]]   ezetimibe (ZETIA) 10 MG tablet [[id number]   escitalopram (LEXAPRO) 20 MG tablet [[id number]]   Pharmacy: [facility]

## 2022-08-28 NOTE — Telephone Encounter (Signed)
Refills sent to pharmacy. 

## 2022-09-03 ENCOUNTER — Other Ambulatory Visit: Payer: Self-pay | Admitting: Family Medicine

## 2022-09-03 DIAGNOSIS — E039 Hypothyroidism, unspecified: Secondary | ICD-10-CM

## 2022-09-17 ENCOUNTER — Other Ambulatory Visit: Payer: Self-pay | Admitting: Family Medicine

## 2022-09-17 DIAGNOSIS — E039 Hypothyroidism, unspecified: Secondary | ICD-10-CM

## 2022-09-18 ENCOUNTER — Other Ambulatory Visit: Payer: Self-pay | Admitting: Family Medicine

## 2022-09-24 ENCOUNTER — Telehealth: Payer: Self-pay | Admitting: Family Medicine

## 2022-09-24 ENCOUNTER — Other Ambulatory Visit: Payer: Self-pay

## 2022-09-24 DIAGNOSIS — E039 Hypothyroidism, unspecified: Secondary | ICD-10-CM

## 2022-09-24 MED ORDER — ESCITALOPRAM OXALATE 20 MG PO TABS: 40.0000 mg | ORAL_TABLET | Freq: Every day | ORAL | 0 refills | Status: DC

## 2022-09-24 MED ORDER — LEVOTHYROXINE SODIUM 100 MCG PO TABS
100.0000 ug | ORAL_TABLET | Freq: Every day | ORAL | 0 refills | Status: DC
Start: 2022-09-24 — End: 2022-10-15

## 2022-09-24 NOTE — Telephone Encounter (Signed)
Pt called and requested refills. Pharmacy told pt to contact provider.   escitalopram (LEXAPRO) 20 MG tablet [[id number]  levothyroxine (SYNTHROID) 100 MCG tablet

## 2022-09-24 NOTE — Telephone Encounter (Signed)
Refills sent

## 2022-09-24 NOTE — Telephone Encounter (Signed)
Yes that's fine 

## 2022-10-07 ENCOUNTER — Other Ambulatory Visit: Payer: Self-pay | Admitting: Family Medicine

## 2022-10-10 ENCOUNTER — Telehealth: Payer: Self-pay | Admitting: Family Medicine

## 2022-10-10 ENCOUNTER — Other Ambulatory Visit: Payer: Self-pay

## 2022-10-10 DIAGNOSIS — F419 Anxiety disorder, unspecified: Secondary | ICD-10-CM

## 2022-10-10 MED ORDER — ESCITALOPRAM OXALATE 20 MG PO TABS
40.0000 mg | ORAL_TABLET | Freq: Every day | ORAL | 0 refills | Status: DC
Start: 2022-10-10 — End: 2022-10-11

## 2022-10-10 NOTE — Telephone Encounter (Signed)
Refill sent.

## 2022-10-10 NOTE — Telephone Encounter (Signed)
Pt called and stated she keeps running out of her medication. She requested a refill.     escitalopram (LEXAPRO) 20 MG tablet [[id number]]

## 2022-10-10 NOTE — Telephone Encounter (Signed)
Also pt stated she thinks the quantity is wrong on the rx.

## 2022-10-10 NOTE — Telephone Encounter (Deleted)
[id number]

## 2022-10-10 NOTE — Telephone Encounter (Addendum)
Pt called back and stated she wasn't going to have enough for a month. Since she takes 2 a day and the quantity is 30 tablets

## 2022-10-11 ENCOUNTER — Other Ambulatory Visit: Payer: Self-pay | Admitting: Family Medicine

## 2022-10-11 DIAGNOSIS — F419 Anxiety disorder, unspecified: Secondary | ICD-10-CM

## 2022-10-11 MED ORDER — ESCITALOPRAM OXALATE 20 MG PO TABS: 40.0000 mg | ORAL_TABLET | Freq: Every day | ORAL | 2 refills | Status: DC

## 2022-10-11 NOTE — Telephone Encounter (Signed)
Refill sent to [facility]

## 2022-10-14 ENCOUNTER — Other Ambulatory Visit: Payer: Self-pay | Admitting: Family Medicine

## 2022-10-14 DIAGNOSIS — E039 Hypothyroidism, unspecified: Secondary | ICD-10-CM

## 2022-10-17 ENCOUNTER — Other Ambulatory Visit: Payer: Self-pay | Admitting: Family Medicine

## 2022-11-05 ENCOUNTER — Other Ambulatory Visit: Payer: Self-pay | Admitting: Nurse Practitioner

## 2022-11-05 DIAGNOSIS — E785 Hyperlipidemia, unspecified: Secondary | ICD-10-CM

## 2022-11-13 ENCOUNTER — Ambulatory Visit (INDEPENDENT_AMBULATORY_CARE_PROVIDER_SITE_OTHER): Payer: Medicare Other

## 2022-11-13 VITALS — Ht 61.0 in | Wt 170.0 lb

## 2022-11-13 DIAGNOSIS — Z0000 Encounter for general adult medical examination without abnormal findings: Secondary | ICD-10-CM | POA: Diagnosis not present

## 2022-11-13 NOTE — Progress Notes (Signed)
 Subjective:   Victoria Sims is a 82 y.o. female who presents for Medicare Annual (Subsequent) preventive examination.  This visit was completed with the help of patients spouse due to patient having demential  Visit Complete: Virtual  I connected with  Alison Murray on 11/13/22 by a audio enabled telemedicine application and verified that I am speaking with the correct person using two identifiers.  Patient Location: Home  Provider Location: Home Office  I discussed the limitations of evaluation and management by telemedicine. The patient expressed understanding and agreed to proceed.  Patient Medicare AWV questionnaire was completed by the patient on na; I have confirmed that all information answered by patient is correct and no changes since this date.  Review of Systems     Cardiac Risk Factors include: advanced age (>68men, >50 women);sedentary lifestyle;dyslipidemia;Other (see comment) (dementia), Risk factor comments: dementia     Objective:    Today's Vitals   11/13/22 1332  Weight: 170 lb (77.1 kg)  Height: 5\' 1"  (1.549 m)   Body mass index is 32.12 kg/m.     11/13/2022    1:32 PM 10/24/2021    1:00 PM 08/16/2021   12:17 PM 05/19/2021   10:43 AM 09/29/2011    2:00 PM 09/26/2011    8:02 PM  Advanced Directives  Does Patient Have a Medical Advance Directive? No No No Yes Patient does not have advance directive;Patient would not like information Patient does not have advance directive;Patient would not like information  Copy of Healthcare Power of Attorney in Chart?    No - copy requested    Would patient like information on creating a medical advance directive? No - Patient declined No - Patient declined      Pre-existing out of facility DNR order (yellow form or pink MOST form)     No No    Current Medications (verified) Outpatient Encounter Medications as of 11/13/2022  Medication Sig   aspirin 81 MG EC tablet Take 81 mg by mouth daily.   Cholecalciferol  (VITAMIN D-1000 MAX ST) 25 MCG (1000 UT) tablet Take by mouth.   diclofenac Sodium (VOLTAREN) 1 % GEL Apply 4 g topically at bedtime.   Docusate Sodium (DSS) 100 MG CAPS Take by mouth.   doxycycline (VIBRA-TABS) 100 MG tablet Take 1 tablet (100 mg total) by mouth 2 (two) times daily.   escitalopram (LEXAPRO) 20 MG tablet Take 2 tablets (40 mg total) by mouth daily.   ezetimibe (ZETIA) 10 MG tablet TAKE 1 TABLET BY MOUTH DAILY   furosemide (LASIX) 20 MG tablet TAKE 1 TABLET BY MOUTH EVERY DAY AS NEEDED FOR SWELLING NEED TO SCHEDULE VISIT WITH DR HILTY   hydrOXYzine (VISTARIL) 25 MG capsule TAKE ONE CAPSULE BY MOUTH EVERY 8 HOURS AS NEEDED   levothyroxine (SYNTHROID) 100 MCG tablet TAKE 1 TABLET BY MOUTH DAILY   mirabegron ER (MYRBETRIQ) 50 MG TB24 tablet Take 1 tablet (50 mg total) by mouth daily.   mirtazapine (REMERON) 15 MG tablet TAKE 1 TABLET BY MOUTH AT BEDTIME   pantoprazole (PROTONIX) 40 MG tablet TAKE 1 TABLET BY MOUTH DAILY   rosuvastatin (CRESTOR) 20 MG tablet TAKE 1 TABLET BY MOUTH EVERY DAY   potassium chloride SA (KLOR-CON M) 20 MEQ tablet TAKE 1 TABLET BY MOUTH ONCE A DAY WITH FOOD (Patient not taking: Reported on 11/13/2022)   No facility-administered encounter medications on file as of 11/13/2022.    Allergies (verified) Atorvastatin, Gabapentin, Macrobid [nitrofurantoin macrocrystal], and Sulfa antibiotics  History: Past Medical History:  Diagnosis Date   Alzheimer's dementia (HCC)    Breast cancer (HCC) 2006   right   Cancer (HCC) 07/2005   right breast/ER/PR positive   Cataract    and dry eye/spots on retina   Cataract    surgery on right eye 04/12/14   CKD (chronic kidney disease) stage 3, GFR 30-59 ml/min (HCC)    Condyloma 01/1998   Depression    Grave's disease    Macular degeneration of both eyes    Microhematuria 07/1992   negative IVP, [place]   MRSA (methicillin resistant Staphylococcus aureus)    Personal history of radiation therapy    Shingles    Past  Surgical History:  Procedure Laterality Date   ABDOMINAL HYSTERECTOMY     BREAST BIOPSY Bilateral 1990   BREAST LUMPECTOMY Right    CATARACT EXTRACTION  04/12/14   right eye   CATARACT EXTRACTION  2/16   left eye   CHOLECYSTECTOMY     EYE SURGERY     goiter  7/93   radioactive iodine   INGUINAL HERNIA REPAIR  09/29/2011   Procedure: HERNIA REPAIR INGUINAL ADULT;  Surgeon: Emelia Loron, MD;  Location: [facility];  Service: General;  Laterality: Right;   TUBAL LIGATION     D&C   Family History  Problem Relation Age of Onset   Hypertension Mother    Heart disease Mother    Breast cancer Neg Hx    Social History   Socioeconomic History   Marital status: Married    Spouse name: Not on file   Number of children: Not on file   Years of education: Not on file   Highest education level: Not on file  Occupational History   Not on file  Tobacco Use   Smoking status: Former    Packs/day: 0.50    Years: 54.00    Additional pack years: 0.00    Total pack years: 27.00    Types: Cigarettes    Quit date: 09/2021    Years since quitting: 1.1   Smokeless tobacco: Never  Vaping Use   Vaping Use: Never used  Substance and Sexual Activity   Alcohol use: No    Alcohol/week: 0.0 standard drinks of alcohol   Drug use: No   Sexual activity: Yes    Partners: Male    Birth control/protection: Surgical    Comment: hysterectomy  Other Topics Concern   Not on file  Social History Narrative   Lives with her husband    Social Determinants of Health   Financial Resource Strain: Low Risk  (11/13/2022)   Overall Financial Resource Strain (CARDIA)    Difficulty of Paying Living Expenses: Not hard at all  Food Insecurity: No Food Insecurity (11/13/2022)   Hunger Vital Sign    Worried About Running Out of Food in the Last Year: Never true    Ran Out of Food in the Last Year: Never true  Transportation Needs: No Transportation Needs (11/13/2022)   PRAPARE - Scientist, research (physical sciences) (Medical): No    Lack of Transportation (Non-Medical): No  Physical Activity: Patient Unable To Answer (11/13/2022)   Exercise Vital Sign    Days of Exercise per Week: Patient unable to answer    Minutes of Exercise per Session: Patient unable to answer  Stress: Patient Unable To Answer (11/13/2022)   [employer] of Occupational Health - Occupational Stress Questionnaire    Feeling of Stress : Patient unable  to answer  Social Connections: Patient Unable To Answer (11/13/2022)   Social Connection and Isolation Panel [NHANES]    Frequency of Communication with Friends and Family: Patient unable to answer    Frequency of Social Gatherings with Friends and Family: Patient unable to answer    Attends Religious Services: Patient unable to answer    Active Member of Clubs or Organizations: Patient unable to answer    Attends Banker Meetings: Patient unable to answer    Marital Status: Patient unable to answer    Tobacco Counseling Counseling given: Yes   Clinical Intake:  Pre-visit preparation completed: Yes  Pain : No/denies pain     BMI - recorded: 32.12 Nutritional Status: BMI > 30  Obese Nutritional Risks: None Diabetes: No  How often do you need to have someone help you when you read instructions, pamphlets, or other written materials from your doctor or pharmacy?: 1 - Never  Interpreter Needed?: No  Information entered by ::  Donold Marotto, CMA   Activities of Daily Living    11/13/2022    1:42 PM  In your present state of health, do you have any difficulty performing the following activities:  Hearing? 1  Vision? 1  Difficulty concentrating or making decisions? 1  Walking or climbing stairs? 1  Dressing or bathing? 1  Comment bed bound  Doing errands, shopping? 1  Preparing Food and eating ? Y  Using the Toilet? Y  In the past six months, have you accidently leaked urine? Y  Do you have problems with loss of bowel control? Y   Managing your Medications? Y  Managing your Finances? Y  Housekeeping or managing your Housekeeping? Y    Patient Care Team: Gilmore Laroche, FNP as PCP - General (Family Medicine) Rennis Golden Lisette Abu, MD as PCP - Cardiology (Cardiology)  Indicate any recent Medical Services you may have received from other than [facility] providers in the past year (date may be approximate).     Assessment:   This is a routine wellness examination for Shenique.  Hearing/Vision screen Hearing Screening - Comments:: Patient denies any hearing difficulties.    Dietary issues and exercise activities discussed:     Goals Addressed             This Visit's Progress    Patient Stated       Improve health and remain active       Depression Screen    11/13/2022    1:41 PM 05/21/2022   10:00 AM 10/24/2021    1:00 PM 10/24/2021   12:58 PM 10/24/2021   11:48 AM 10/18/2021    3:39 PM 09/29/2021    9:35 AM  PHQ 2/9 Scores  PHQ - 2 Score  0 0 0 0 0 3  PHQ- 9 Score  0       Exception Documentation Medical reason          Fall Risk    11/13/2022    1:41 PM 05/21/2022   10:00 AM 10/24/2021    1:00 PM 10/24/2021   11:48 AM 10/18/2021    3:38 PM  Fall Risk   Falls in the past year? Exclusion - non ambulatory 0 0 0 0  Comment patient is bed bound      Number falls in past yr:  0 0 0 0  Injury with Fall?  0 0 0 0  Risk for fall due to :  No Fall Risks No Fall Risks No Fall Risks No  Fall Risks  Follow up  Falls evaluation completed Falls evaluation completed Falls evaluation completed Falls evaluation completed    MEDICARE RISK AT HOME:  Medicare Risk at Home - 11/13/22 1338     Any stairs in or around the home? No    If so, are there any without handrails? No    Home free of loose throw rugs in walkways, pet beds, electrical cords, etc? Yes    Adequate lighting in your home to reduce risk of falls? Yes    Life alert? No    Use of a cane, walker or w/c? Yes   has to use a transport service if she goes  to an outside appt. bed bound   Grab bars in the bathroom? No    Shower chair or bench in shower? No    Elevated toilet seat or a handicapped toilet? No             TIMED UP AND GO:  Was the test performed?  No    Cognitive Function:    10/24/2021    1:01 PM 12/10/2019    7:40 AM  MMSE - Mini Mental State Exam  Not completed: Unable to complete   Orientation to time  2  Orientation to Place  3  Registration  3  Attention/ Calculation  5  Recall  2  Language- name 2 objects  2  Language- repeat  0  Language- follow 3 step command  2  Language- read & follow direction  1  Write a sentence  1  Copy design  0  Total score  21        11/13/2022    1:39 PM  6CIT Screen  What Year? 4 points  What month? 3 points  What time? 3 points  Count back from 20 4 points  Months in reverse 4 points  Repeat phrase 10 points  Total Score 28 points    Immunizations There is no immunization history for the selected administration types on file for this patient.  TDAP status: Due, Education has been provided regarding the importance of this vaccine. Advised may receive this vaccine at local pharmacy or Health Dept. Aware to provide a copy of the vaccination record if obtained from local pharmacy or Health Dept. Verbalized acceptance and understanding.  Flu Vaccine status: Up to date  Pneumococcal vaccine status: Due, Education has been provided regarding the importance of this vaccine. Advised may receive this vaccine at local pharmacy or Health Dept. Aware to provide a copy of the vaccination record if obtained from local pharmacy or Health Dept. Verbalized acceptance and understanding.  Covid-19 vaccine status: Information provided on how to obtain vaccines.   Qualifies for Shingles Vaccine? Yes   Zostavax completed No   Shingrix Completed?: No.    Education has been provided regarding the importance of this vaccine. Patient has been advised to call insurance company to determine  out of pocket expense if they have not yet received this vaccine. Advised may also receive vaccine at local pharmacy or Health Dept. Verbalized acceptance and understanding.  Screening Tests Health Maintenance  Topic Date Due   COVID-19 Vaccine (1) Never done   Pneumonia Vaccine 96+ Years old (1 of 2 - PCV) Never done   Hepatitis C Screening  Never done   DTaP/Tdap/Td (1 - Tdap) Never done   Zoster Vaccines- Shingrix (1 of 2) Never done   INFLUENZA VACCINE  12/06/2022   DEXA SCAN  Completed   HPV  VACCINES  Aged Out   Colonoscopy  Discontinued    Health Maintenance  Health Maintenance Due  Topic Date Due   COVID-19 Vaccine (1) Never done   Pneumonia Vaccine 44+ Years old (1 of 2 - PCV) Never done   Hepatitis C Screening  Never done   DTaP/Tdap/Td (1 - Tdap) Never done   Zoster Vaccines- Shingrix (1 of 2) Never done    Colorectal cancer screening: No longer required.   Mammogram status: Completed 02/23/20. Repeat every yearPatient unable to have mammogram performed. Has demential  Bone Density Screening: Patient has dementia and is bedbound  Lung Cancer Screening: (Low Dose CT Chest recommended if Age 43-80 years, 20 pack-year currently smoking OR have quit w/in 15years.) does qualify.   Lung Cancer Screening Referral: patient is bed bound. Unable to have screening due to dementia  Additional Screening:  Hepatitis C Screening: does not qualify;  Vision Screening: Recommended annual ophthalmology exams for early detection of glaucoma and other disorders of the eye. Is the patient up to date with their annual eye exam?  No  Who is the provider or what is the name of the office in which the patient attends annual eye exams? Bed bound/ dementia If pt is not established with a provider, would they like to be referred to a provider to establish care? No .   Dental Screening: Recommended annual dental exams for proper oral hygiene  Diabetic Foot Exam: n/a  Community Resource  Referral / Chronic Care Management: CRR required this visit?  No   CCM required this visit?  No     Plan:     I have personally reviewed and noted the following in the patient's chart:   Medical and social history Use of alcohol, tobacco or illicit drugs  Current medications and supplements including opioid prescriptions. Patient is not currently taking opioid prescriptions. Functional ability and status Nutritional status Physical activity Advanced directives List of other physicians Hospitalizations, surgeries, and ER visits in previous 12 months Vitals Screenings to include cognitive, depression, and falls Referrals and appointments  In addition, I have reviewed and discussed with patient certain preventive protocols, quality metrics, and best practice recommendations. A written personalized care plan for preventive services as well as general preventive health recommendations were provided to patient.   Any medications not marked as taking were not mentioned by the patient (or their caregiver if applicable) when reconciling the medications.  Because this visit was a virtual/telehealth visit,  certain criteria was not obtained, such a blood pressure, CBG if patient is a diabetic, and timed up and go.    Jordan Hawks Shterna Laramee, CMA   11/13/2022   After Visit Summary: (Mail) Due to this being a telephonic visit, the after visit summary with patients personalized plan was offered to patient via mail   Nurse Notes:

## 2022-11-13 NOTE — Patient Instructions (Signed)
Victoria Sims , Thank you for taking time to come for your Medicare Wellness Visit. I appreciate your ongoing commitment to your health goals. Please review the following plan we discussed and let me know if I can assist you in the future.   These are the goals we discussed:  Goals      Patient Stated     Improve health and remain active        This is a list of the screening recommended for you and due dates:  Health Maintenance  Topic Date Due   COVID-19 Vaccine (1) Never done   Pneumonia Vaccine (1 of 2 - PCV) Never done   Hepatitis C Screening  Never done   DTaP/Tdap/Td vaccine (1 - Tdap) Never done   Zoster (Shingles) Vaccine (1 of 2) Never done   Flu Shot  12/06/2022   DEXA scan (bone density measurement)  Completed   HPV Vaccine  Aged Out   Colon Cancer Screening  Discontinued    Advanced directives: Advance directive discussed with you today. Even though you declined this today, please call our office should you change your mind, and we can give you the proper paperwork for you to fill out. Advance care planning is a way to make decisions about medical care that fits your values in case you are ever unable to make these decisions for yourself.  Information on Advanced Care Planning can be found at [facility] Advance Health Care Directives Advance Health Care Directives ([email or web address]    Conditions/risks identified: Alzheimer's Disease Caregiver Guide Alzheimer's disease is a brain disease that causes memory loss and changes in behavior. People with Alzheimer's disease often have problems paying attention, communicating, and doing routine tasks. The disease gets worse over time, and people with the disease eventually need full-time care. Taking care of someone with Alzheimer's disease can be challenging and overwhelming. This guide provides helpful information and tips that can make caring for someone with the disease a little easier. How to help manage  lifestyle changes Managing symptoms Be calm and patient. Give short, simple answers to questions. Long answers can overwhelm and confuse the person. Avoid correcting the person in a negative way. Try not to take things personally, even if the person forgets your name. Understand that changes are a part of the disease process. Do not argue or try to convince the person about a specific point. Doing that may make the person feel more agitated. Reducing frustration Make appointments and do daily tasks, like bathing and dressing, when the person is at his or her best. Allow for plenty of time for simple tasks because they may take longer than expected. Take your time when doing these tasks. Limit the person's choices. Too many choices can be overwhelming and stressful for the person. Involve the person in what you are doing. Take steps to help keep things organized such as: Keeping a daily routine. Organizing medicines in a pillbox for each day of the week. Keeping a calendar in a central location to remind the person of appointments or other activities. Avoid crowds and new situations, if possible. Use simple words, short sentences, and a calm voice. Only give one direction at a time. Buy clothes and shoes for the person that are easy to put on and take off. Try to change the subject or topic if the person becomes frustrated or angry. Distraction is a great technique for making a situation less tense. Reducing the risk of injury  Keep floors clear of clutter. Remove rugs, magazine racks, and floor lamps. Keep hallways well-lit, especially at night. Put a handrail and nonslip mat in the bathtub or shower. Put childproof locks on cabinets that contain dangerous items, such as medicines, alcohol, guns, toxic cleaning items, sharp tools or utensils, matches, and lighters. Put locks on doors. Put the locks in places where the person cannot see or reach them easily. This will help ensure that the  person does not wander out of the house and get lost. Be prepared for emergencies. Keep a list of emergency phone numbers and addresses in a convenient area. Remove car keys and lock garage doors so that the person does not try to get in the car and drive. A certain type of bracelet may be worn that tracks a person's location and identifies him or her as having memory problems. This should be worn at all times for safety. Planning for the future  Discuss financial and legal planning early on in the course of the disease. People with Alzheimer's disease will have trouble managing their money as the disease gets worse. Get help from professional advisers regarding financial and legal matters. Discuss advance directives, safety, and daily care. Take these steps: Create a living will and choose a power of attorney. The person with power of attorney will be able to make decisions for the person with Alzheimer's disease when he or she is no longer able to. Discuss driving safety and when to stop driving. The person's health care provider can help provide assistance with this decision. Discuss the person's living situation. If the person lives alone, make sure he or she is safe. People who live at home may need extra help from home health caregivers, and those who live in a nursing home or care center may need more care. How to recognize changes in the person's condition Alzheimer's disease causes a person to lose the ability to remember things and make decisions. Memory loss and confusion are usually mild at the start of the disease, but they slowly get more severe over time. Eventually, the person may not recognize friends, family members, or familiar places. Alzheimer's disease can also cause hallucinations, changes in behavior, and changes in mood, such as anxiety or anger. The changes can come on suddenly. They may happen in response to something such as: Pain. An infection. Changes in environment,  such as changes in temperature or noise. Overstimulation. Feeling lost or scared. Medicines. Where to find support Ask about respite care resources. Respite care can provide short-term care for the person so that you can have a regular break from the stress of caregiving. Consider joining a local support group. Advantages of being part of a support group include: Learning strategies to manage stress. Sharing experiences with others. Receiving emotional comfort and support. Learning about caregiving as the disease progresses. Knowing what community resources are available and making use of them. Where to find more information Alzheimer's Association: LimitLaws.hu Contact a health care provider if: The person has a fever. The person has a sudden change in behavior that does not improve with calming strategies. The person is unable to manage in his or her current living situation. You are no longer able to care for the person. Get help right away if: The person has a sudden increase in confusion or new hallucinations. The person threatens himself or herself, you, or anyone else. If you ever feel like your loved one may hurt himself or herself or others, or shares  thoughts about taking his or her own life, get help right away. You can go to your nearest emergency department or: Call your local emergency services (911 in the [place] Call a suicide crisis helpline, such as the National Suicide Prevention Lifeline at (548)435-9870 or 988 in the [place] This is open 24 hours a day in the [place] Text the Crisis Text Line at 641 325 2623 (in the [place] Summary Alzheimer's disease is a brain disease that causes memory loss and changes in behavior. People with Alzheimer's disease often have problems paying attention, communicating, and doing routine tasks. The disease gets worse over time, and people with the disease eventually need full-time care. Take steps to reduce the person's risk of injury and plan for  future care. Taking care of someone with Alzheimer's disease can be very challenging and overwhelming. One way to find support during this time is to join a local support group. This information is not intended to replace advice given to you by your health care provider. Make sure you discuss any questions you have with your health care provider. Document Revised: 11/16/2020 Document Reviewed: 08/10/2019 Elsevier Patient Education  2024 Elsevier Inc.   Next appointment: VIRTUAL/TELEPHONE APPOINTMENT Follow up in one year for your annual wellness visit  December 18, 2023 at 1:30pm telephone visit   Preventive Care 65 Years and Older, Female Preventive care refers to lifestyle choices and visits with your health care provider that can promote health and wellness. What does preventive care include? A yearly physical exam. This is also called an annual well check. Dental exams once or twice a year. Routine eye exams. Ask your health care provider how often you should have your eyes checked. Personal lifestyle choices, including: Daily care of your teeth and gums. Regular physical activity. Eating a healthy diet. Avoiding tobacco and drug use. Limiting alcohol use. Practicing safe sex. Taking low-dose aspirin every day. Taking vitamin and mineral supplements as recommended by your health care provider. What happens during an annual well check? The services and screenings done by your health care provider during your annual well check will depend on your age, overall health, lifestyle risk factors, and family history of disease. Counseling  Your health care provider may ask you questions about your: Alcohol use. Tobacco use. Drug use. Emotional well-being. Home and relationship well-being. Sexual activity. Eating habits. History of falls. Memory and ability to understand (cognition). Work and work Astronomer. Reproductive health. Screening  You may have the following tests or  measurements: Height, weight, and BMI. Blood pressure. Lipid and cholesterol levels. These may be checked every 5 years, or more frequently if you are over 33 years old. Skin check. Lung cancer screening. You may have this screening every year starting at age 23 if you have a 30-pack-year history of smoking and currently smoke or have quit within the past 15 years. Fecal occult blood test (FOBT) of the stool. You may have this test every year starting at age 89. Flexible sigmoidoscopy or colonoscopy. You may have a sigmoidoscopy every 5 years or a colonoscopy every 10 years starting at age 71. Hepatitis C blood test. Hepatitis B blood test. Sexually transmitted disease (STD) testing. Diabetes screening. This is done by checking your blood sugar (glucose) after you have not eaten for a while (fasting). You may have this done every 1-3 years. Bone density scan. This is done to screen for osteoporosis. You may have this done starting at age 77. Mammogram. This may be done every 1-2 years. Talk to  your health care provider about how often you should have regular mammograms. Talk with your health care provider about your test results, treatment options, and if necessary, the need for more tests. Vaccines  Your health care provider may recommend certain vaccines, such as: Influenza vaccine. This is recommended every year. Tetanus, diphtheria, and acellular pertussis (Tdap, Td) vaccine. You may need a Td booster every 10 years. Zoster vaccine. You may need this after age 62. Pneumococcal 13-valent conjugate (PCV13) vaccine. One dose is recommended after age 70. Pneumococcal polysaccharide (PPSV23) vaccine. One dose is recommended after age 47. Talk to your health care provider about which screenings and vaccines you need and how often you need them. This information is not intended to replace advice given to you by your health care provider. Make sure you discuss any questions you have with your  health care provider. Document Released: 05/20/2015 Document Revised: 01/11/2016 Document Reviewed: 02/22/2015 Elsevier Interactive Patient Education  2017 [employer].  Fall Prevention in the Home Falls can cause injuries. They can happen to people of all ages. There are many things you can do to make your home safe and to help prevent falls. What can I do on the outside of my home? Regularly fix the edges of walkways and driveways and fix any cracks. Remove anything that might make you trip as you walk through a door, such as a raised step or threshold. Trim any bushes or trees on the path to your home. Use bright outdoor lighting. Clear any walking paths of anything that might make someone trip, such as rocks or tools. Regularly check to see if handrails are loose or broken. Make sure that both sides of any steps have handrails. Any raised decks and porches should have guardrails on the edges. Have any leaves, snow, or ice cleared regularly. Use sand or salt on walking paths during winter. Clean up any spills in your garage right away. This includes oil or grease spills. What can I do in the bathroom? Use night lights. Install grab bars by the toilet and in the tub and shower. Do not use towel bars as grab bars. Use non-skid mats or decals in the tub or shower. If you need to sit down in the shower, use a plastic, non-slip stool. Keep the floor dry. Clean up any water that spills on the floor as soon as it happens. Remove soap buildup in the tub or shower regularly. Attach bath mats securely with double-sided non-slip rug tape. Do not have throw rugs and other things on the floor that can make you trip. What can I do in the bedroom? Use night lights. Make sure that you have a light by your bed that is easy to reach. Do not use any sheets or blankets that are too big for your bed. They should not hang down onto the floor. Have a firm chair that has side arms. You can use this for  support while you get dressed. Do not have throw rugs and other things on the floor that can make you trip. What can I do in the kitchen? Clean up any spills right away. Avoid walking on wet floors. Keep items that you use a lot in easy-to-reach places. If you need to reach something above you, use a strong step stool that has a grab bar. Keep electrical cords out of the way. Do not use floor polish or wax that makes floors slippery. If you must use wax, use non-skid floor wax. Do not  have throw rugs and other things on the floor that can make you trip. What can I do with my stairs? Do not leave any items on the stairs. Make sure that there are handrails on both sides of the stairs and use them. Fix handrails that are broken or loose. Make sure that handrails are as long as the stairways. Check any carpeting to make sure that it is firmly attached to the stairs. Fix any carpet that is loose or worn. Avoid having throw rugs at the top or bottom of the stairs. If you do have throw rugs, attach them to the floor with carpet tape. Make sure that you have a light switch at the top of the stairs and the bottom of the stairs. If you do not have them, ask someone to add them for you. What else can I do to help prevent falls? Wear shoes that: Do not have high heels. Have rubber bottoms. Are comfortable and fit you well. Are closed at the toe. Do not wear sandals. If you use a stepladder: Make sure that it is fully opened. Do not climb a closed stepladder. Make sure that both sides of the stepladder are locked into place. Ask someone to hold it for you, if possible. Clearly mark and make sure that you can see: Any grab bars or handrails. First and last steps. Where the edge of each step is. Use tools that help you move around (mobility aids) if they are needed. These include: Canes. Walkers. Scooters. Crutches. Turn on the lights when you go into a dark area. Replace any light bulbs as soon  as they burn out. Set up your furniture so you have a clear path. Avoid moving your furniture around. If any of your floors are uneven, fix them. If there are any pets around you, be aware of where they are. Review your medicines with your doctor. Some medicines can make you feel dizzy. This can increase your chance of falling. Ask your doctor what other things that you can do to help prevent falls. This information is not intended to replace advice given to you by your health care provider. Make sure you discuss any questions you have with your health care provider. Document Released: 02/17/2009 Document Revised: 09/29/2015 Document Reviewed: 05/28/2014 Elsevier Interactive Patient Education  2017 [employer].

## 2022-11-30 ENCOUNTER — Other Ambulatory Visit: Payer: Self-pay | Admitting: Family Medicine

## 2022-11-30 DIAGNOSIS — E039 Hypothyroidism, unspecified: Secondary | ICD-10-CM

## 2022-12-05 ENCOUNTER — Other Ambulatory Visit: Payer: Self-pay | Admitting: Family Medicine

## 2022-12-21 ENCOUNTER — Telehealth: Payer: Self-pay | Admitting: Family Medicine

## 2022-12-21 NOTE — Telephone Encounter (Signed)
Gwenyth Allegra collector called to let provider received a referral and will be following up with palliative care. Any questions can call sherry back at [phone].

## 2022-12-24 ENCOUNTER — Other Ambulatory Visit: Payer: Self-pay | Admitting: Family Medicine

## 2022-12-24 DIAGNOSIS — F419 Anxiety disorder, unspecified: Secondary | ICD-10-CM

## 2022-12-31 DIAGNOSIS — Z993 Dependence on wheelchair: Secondary | ICD-10-CM | POA: Diagnosis not present

## 2022-12-31 DIAGNOSIS — Z8619 Personal history of other infectious and parasitic diseases: Secondary | ICD-10-CM | POA: Diagnosis not present

## 2022-12-31 DIAGNOSIS — Z9049 Acquired absence of other specified parts of digestive tract: Secondary | ICD-10-CM | POA: Diagnosis not present

## 2022-12-31 DIAGNOSIS — F39 Unspecified mood [affective] disorder: Secondary | ICD-10-CM | POA: Diagnosis not present

## 2022-12-31 DIAGNOSIS — Z90710 Acquired absence of both cervix and uterus: Secondary | ICD-10-CM | POA: Diagnosis not present

## 2022-12-31 DIAGNOSIS — N3941 Urge incontinence: Secondary | ICD-10-CM | POA: Diagnosis not present

## 2022-12-31 DIAGNOSIS — Z853 Personal history of malignant neoplasm of breast: Secondary | ICD-10-CM | POA: Diagnosis not present

## 2022-12-31 DIAGNOSIS — E039 Hypothyroidism, unspecified: Secondary | ICD-10-CM | POA: Diagnosis not present

## 2022-12-31 DIAGNOSIS — Z9842 Cataract extraction status, left eye: Secondary | ICD-10-CM | POA: Diagnosis not present

## 2022-12-31 DIAGNOSIS — Z923 Personal history of irradiation: Secondary | ICD-10-CM | POA: Diagnosis not present

## 2022-12-31 DIAGNOSIS — H3530 Unspecified macular degeneration: Secondary | ICD-10-CM | POA: Diagnosis not present

## 2022-12-31 DIAGNOSIS — K219 Gastro-esophageal reflux disease without esophagitis: Secondary | ICD-10-CM | POA: Diagnosis not present

## 2022-12-31 DIAGNOSIS — G309 Alzheimer's disease, unspecified: Secondary | ICD-10-CM | POA: Diagnosis not present

## 2022-12-31 DIAGNOSIS — E0500 Thyrotoxicosis with diffuse goiter without thyrotoxic crisis or storm: Secondary | ICD-10-CM | POA: Diagnosis not present

## 2022-12-31 DIAGNOSIS — F0283 Dementia in other diseases classified elsewhere, unspecified severity, with mood disturbance: Secondary | ICD-10-CM | POA: Diagnosis not present

## 2022-12-31 DIAGNOSIS — Z9851 Tubal ligation status: Secondary | ICD-10-CM | POA: Diagnosis not present

## 2022-12-31 DIAGNOSIS — N183 Chronic kidney disease, stage 3 unspecified: Secondary | ICD-10-CM | POA: Diagnosis not present

## 2022-12-31 DIAGNOSIS — F0284 Dementia in other diseases classified elsewhere, unspecified severity, with anxiety: Secondary | ICD-10-CM | POA: Diagnosis not present

## 2022-12-31 DIAGNOSIS — Z9841 Cataract extraction status, right eye: Secondary | ICD-10-CM | POA: Diagnosis not present

## 2022-12-31 DIAGNOSIS — Z8614 Personal history of Methicillin resistant Staphylococcus aureus infection: Secondary | ICD-10-CM | POA: Diagnosis not present

## 2022-12-31 DIAGNOSIS — F32A Depression, unspecified: Secondary | ICD-10-CM | POA: Diagnosis not present

## 2022-12-31 DIAGNOSIS — Z87891 Personal history of nicotine dependence: Secondary | ICD-10-CM | POA: Diagnosis not present

## 2022-12-31 DIAGNOSIS — E785 Hyperlipidemia, unspecified: Secondary | ICD-10-CM | POA: Diagnosis not present

## 2023-01-02 DIAGNOSIS — G309 Alzheimer's disease, unspecified: Secondary | ICD-10-CM | POA: Diagnosis not present

## 2023-01-02 DIAGNOSIS — F32A Depression, unspecified: Secondary | ICD-10-CM | POA: Diagnosis not present

## 2023-01-02 DIAGNOSIS — F39 Unspecified mood [affective] disorder: Secondary | ICD-10-CM | POA: Diagnosis not present

## 2023-01-02 DIAGNOSIS — F0283 Dementia in other diseases classified elsewhere, unspecified severity, with mood disturbance: Secondary | ICD-10-CM | POA: Diagnosis not present

## 2023-01-02 DIAGNOSIS — E039 Hypothyroidism, unspecified: Secondary | ICD-10-CM | POA: Diagnosis not present

## 2023-01-02 DIAGNOSIS — F0284 Dementia in other diseases classified elsewhere, unspecified severity, with anxiety: Secondary | ICD-10-CM | POA: Diagnosis not present

## 2023-01-03 ENCOUNTER — Other Ambulatory Visit: Payer: Self-pay | Admitting: Family Medicine

## 2023-01-03 DIAGNOSIS — E039 Hypothyroidism, unspecified: Secondary | ICD-10-CM | POA: Diagnosis not present

## 2023-01-03 DIAGNOSIS — F32A Depression, unspecified: Secondary | ICD-10-CM | POA: Diagnosis not present

## 2023-01-03 DIAGNOSIS — F39 Unspecified mood [affective] disorder: Secondary | ICD-10-CM | POA: Diagnosis not present

## 2023-01-03 DIAGNOSIS — F0284 Dementia in other diseases classified elsewhere, unspecified severity, with anxiety: Secondary | ICD-10-CM | POA: Diagnosis not present

## 2023-01-03 DIAGNOSIS — G309 Alzheimer's disease, unspecified: Secondary | ICD-10-CM | POA: Diagnosis not present

## 2023-01-03 DIAGNOSIS — F0283 Dementia in other diseases classified elsewhere, unspecified severity, with mood disturbance: Secondary | ICD-10-CM | POA: Diagnosis not present

## 2023-01-04 DIAGNOSIS — G309 Alzheimer's disease, unspecified: Secondary | ICD-10-CM | POA: Diagnosis not present

## 2023-01-04 DIAGNOSIS — F0284 Dementia in other diseases classified elsewhere, unspecified severity, with anxiety: Secondary | ICD-10-CM | POA: Diagnosis not present

## 2023-01-04 DIAGNOSIS — F39 Unspecified mood [affective] disorder: Secondary | ICD-10-CM | POA: Diagnosis not present

## 2023-01-04 DIAGNOSIS — E039 Hypothyroidism, unspecified: Secondary | ICD-10-CM | POA: Diagnosis not present

## 2023-01-04 DIAGNOSIS — F0283 Dementia in other diseases classified elsewhere, unspecified severity, with mood disturbance: Secondary | ICD-10-CM | POA: Diagnosis not present

## 2023-01-04 DIAGNOSIS — F32A Depression, unspecified: Secondary | ICD-10-CM | POA: Diagnosis not present

## 2023-01-05 DIAGNOSIS — E039 Hypothyroidism, unspecified: Secondary | ICD-10-CM | POA: Diagnosis not present

## 2023-01-05 DIAGNOSIS — F32A Depression, unspecified: Secondary | ICD-10-CM | POA: Diagnosis not present

## 2023-01-05 DIAGNOSIS — F0284 Dementia in other diseases classified elsewhere, unspecified severity, with anxiety: Secondary | ICD-10-CM | POA: Diagnosis not present

## 2023-01-05 DIAGNOSIS — G309 Alzheimer's disease, unspecified: Secondary | ICD-10-CM | POA: Diagnosis not present

## 2023-01-05 DIAGNOSIS — F39 Unspecified mood [affective] disorder: Secondary | ICD-10-CM | POA: Diagnosis not present

## 2023-01-05 DIAGNOSIS — F0283 Dementia in other diseases classified elsewhere, unspecified severity, with mood disturbance: Secondary | ICD-10-CM | POA: Diagnosis not present

## 2023-01-06 DIAGNOSIS — G309 Alzheimer's disease, unspecified: Secondary | ICD-10-CM | POA: Diagnosis not present

## 2023-01-08 DIAGNOSIS — F39 Unspecified mood [affective] disorder: Secondary | ICD-10-CM | POA: Diagnosis not present

## 2023-01-08 DIAGNOSIS — F0284 Dementia in other diseases classified elsewhere, unspecified severity, with anxiety: Secondary | ICD-10-CM | POA: Diagnosis not present

## 2023-01-08 DIAGNOSIS — F32A Depression, unspecified: Secondary | ICD-10-CM | POA: Diagnosis not present

## 2023-01-08 DIAGNOSIS — F0283 Dementia in other diseases classified elsewhere, unspecified severity, with mood disturbance: Secondary | ICD-10-CM | POA: Diagnosis not present

## 2023-01-08 DIAGNOSIS — G309 Alzheimer's disease, unspecified: Secondary | ICD-10-CM | POA: Diagnosis not present

## 2023-01-08 DIAGNOSIS — E039 Hypothyroidism, unspecified: Secondary | ICD-10-CM | POA: Diagnosis not present

## 2023-01-09 DIAGNOSIS — F0283 Dementia in other diseases classified elsewhere, unspecified severity, with mood disturbance: Secondary | ICD-10-CM | POA: Diagnosis not present

## 2023-01-09 DIAGNOSIS — F39 Unspecified mood [affective] disorder: Secondary | ICD-10-CM | POA: Diagnosis not present

## 2023-01-09 DIAGNOSIS — F32A Depression, unspecified: Secondary | ICD-10-CM | POA: Diagnosis not present

## 2023-01-09 DIAGNOSIS — G309 Alzheimer's disease, unspecified: Secondary | ICD-10-CM | POA: Diagnosis not present

## 2023-01-09 DIAGNOSIS — E039 Hypothyroidism, unspecified: Secondary | ICD-10-CM | POA: Diagnosis not present

## 2023-01-09 DIAGNOSIS — F0284 Dementia in other diseases classified elsewhere, unspecified severity, with anxiety: Secondary | ICD-10-CM | POA: Diagnosis not present

## 2023-01-10 DIAGNOSIS — F0283 Dementia in other diseases classified elsewhere, unspecified severity, with mood disturbance: Secondary | ICD-10-CM | POA: Diagnosis not present

## 2023-01-10 DIAGNOSIS — E039 Hypothyroidism, unspecified: Secondary | ICD-10-CM | POA: Diagnosis not present

## 2023-01-10 DIAGNOSIS — F0284 Dementia in other diseases classified elsewhere, unspecified severity, with anxiety: Secondary | ICD-10-CM | POA: Diagnosis not present

## 2023-01-10 DIAGNOSIS — F39 Unspecified mood [affective] disorder: Secondary | ICD-10-CM | POA: Diagnosis not present

## 2023-01-10 DIAGNOSIS — G309 Alzheimer's disease, unspecified: Secondary | ICD-10-CM | POA: Diagnosis not present

## 2023-01-10 DIAGNOSIS — F32A Depression, unspecified: Secondary | ICD-10-CM | POA: Diagnosis not present

## 2023-01-15 DIAGNOSIS — E039 Hypothyroidism, unspecified: Secondary | ICD-10-CM | POA: Diagnosis not present

## 2023-01-15 DIAGNOSIS — F0284 Dementia in other diseases classified elsewhere, unspecified severity, with anxiety: Secondary | ICD-10-CM | POA: Diagnosis not present

## 2023-01-15 DIAGNOSIS — F39 Unspecified mood [affective] disorder: Secondary | ICD-10-CM | POA: Diagnosis not present

## 2023-01-15 DIAGNOSIS — G309 Alzheimer's disease, unspecified: Secondary | ICD-10-CM | POA: Diagnosis not present

## 2023-01-15 DIAGNOSIS — F32A Depression, unspecified: Secondary | ICD-10-CM | POA: Diagnosis not present

## 2023-01-15 DIAGNOSIS — F0283 Dementia in other diseases classified elsewhere, unspecified severity, with mood disturbance: Secondary | ICD-10-CM | POA: Diagnosis not present

## 2023-01-16 DIAGNOSIS — F39 Unspecified mood [affective] disorder: Secondary | ICD-10-CM | POA: Diagnosis not present

## 2023-01-16 DIAGNOSIS — F0283 Dementia in other diseases classified elsewhere, unspecified severity, with mood disturbance: Secondary | ICD-10-CM | POA: Diagnosis not present

## 2023-01-16 DIAGNOSIS — G309 Alzheimer's disease, unspecified: Secondary | ICD-10-CM | POA: Diagnosis not present

## 2023-01-16 DIAGNOSIS — E039 Hypothyroidism, unspecified: Secondary | ICD-10-CM | POA: Diagnosis not present

## 2023-01-16 DIAGNOSIS — F0284 Dementia in other diseases classified elsewhere, unspecified severity, with anxiety: Secondary | ICD-10-CM | POA: Diagnosis not present

## 2023-01-16 DIAGNOSIS — F32A Depression, unspecified: Secondary | ICD-10-CM | POA: Diagnosis not present

## 2023-01-22 DIAGNOSIS — E039 Hypothyroidism, unspecified: Secondary | ICD-10-CM | POA: Diagnosis not present

## 2023-01-22 DIAGNOSIS — F39 Unspecified mood [affective] disorder: Secondary | ICD-10-CM | POA: Diagnosis not present

## 2023-01-22 DIAGNOSIS — G309 Alzheimer's disease, unspecified: Secondary | ICD-10-CM | POA: Diagnosis not present

## 2023-01-22 DIAGNOSIS — F32A Depression, unspecified: Secondary | ICD-10-CM | POA: Diagnosis not present

## 2023-01-22 DIAGNOSIS — F0284 Dementia in other diseases classified elsewhere, unspecified severity, with anxiety: Secondary | ICD-10-CM | POA: Diagnosis not present

## 2023-01-22 DIAGNOSIS — F0283 Dementia in other diseases classified elsewhere, unspecified severity, with mood disturbance: Secondary | ICD-10-CM | POA: Diagnosis not present

## 2023-01-23 DIAGNOSIS — F32A Depression, unspecified: Secondary | ICD-10-CM | POA: Diagnosis not present

## 2023-01-23 DIAGNOSIS — F39 Unspecified mood [affective] disorder: Secondary | ICD-10-CM | POA: Diagnosis not present

## 2023-01-23 DIAGNOSIS — F0283 Dementia in other diseases classified elsewhere, unspecified severity, with mood disturbance: Secondary | ICD-10-CM | POA: Diagnosis not present

## 2023-01-23 DIAGNOSIS — G309 Alzheimer's disease, unspecified: Secondary | ICD-10-CM | POA: Diagnosis not present

## 2023-01-23 DIAGNOSIS — F0284 Dementia in other diseases classified elsewhere, unspecified severity, with anxiety: Secondary | ICD-10-CM | POA: Diagnosis not present

## 2023-01-23 DIAGNOSIS — E039 Hypothyroidism, unspecified: Secondary | ICD-10-CM | POA: Diagnosis not present

## 2023-02-03 ENCOUNTER — Other Ambulatory Visit: Payer: Self-pay | Admitting: Family Medicine

## 2023-02-05 DIAGNOSIS — G309 Alzheimer's disease, unspecified: Secondary | ICD-10-CM | POA: Diagnosis not present

## 2023-02-12 ENCOUNTER — Other Ambulatory Visit: Payer: Self-pay | Admitting: Family Medicine

## 2023-02-12 DIAGNOSIS — E039 Hypothyroidism, unspecified: Secondary | ICD-10-CM

## 2023-02-26 DIAGNOSIS — G309 Alzheimer's disease, unspecified: Secondary | ICD-10-CM | POA: Diagnosis not present

## 2023-02-27 ENCOUNTER — Other Ambulatory Visit: Payer: Self-pay | Admitting: Family Medicine

## 2023-02-27 DIAGNOSIS — F419 Anxiety disorder, unspecified: Secondary | ICD-10-CM

## 2023-02-28 DIAGNOSIS — G309 Alzheimer's disease, unspecified: Secondary | ICD-10-CM | POA: Diagnosis not present

## 2023-03-01 DIAGNOSIS — E785 Hyperlipidemia, unspecified: Secondary | ICD-10-CM | POA: Diagnosis not present

## 2023-03-01 DIAGNOSIS — F0393 Unspecified dementia, unspecified severity, with mood disturbance: Secondary | ICD-10-CM | POA: Diagnosis not present

## 2023-03-01 DIAGNOSIS — Z7401 Bed confinement status: Secondary | ICD-10-CM | POA: Diagnosis not present

## 2023-03-01 DIAGNOSIS — F419 Anxiety disorder, unspecified: Secondary | ICD-10-CM | POA: Diagnosis not present

## 2023-03-05 DIAGNOSIS — G309 Alzheimer's disease, unspecified: Secondary | ICD-10-CM | POA: Diagnosis not present

## 2023-03-07 DIAGNOSIS — G309 Alzheimer's disease, unspecified: Secondary | ICD-10-CM | POA: Diagnosis not present

## 2023-03-08 DIAGNOSIS — G309 Alzheimer's disease, unspecified: Secondary | ICD-10-CM | POA: Diagnosis not present

## 2023-03-12 DIAGNOSIS — G309 Alzheimer's disease, unspecified: Secondary | ICD-10-CM | POA: Diagnosis not present

## 2023-03-14 DIAGNOSIS — G309 Alzheimer's disease, unspecified: Secondary | ICD-10-CM | POA: Diagnosis not present

## 2023-03-19 DIAGNOSIS — G309 Alzheimer's disease, unspecified: Secondary | ICD-10-CM | POA: Diagnosis not present

## 2023-03-21 DIAGNOSIS — G309 Alzheimer's disease, unspecified: Secondary | ICD-10-CM | POA: Diagnosis not present

## 2023-03-26 DIAGNOSIS — G309 Alzheimer's disease, unspecified: Secondary | ICD-10-CM | POA: Diagnosis not present

## 2023-03-26 DIAGNOSIS — F0280 Dementia in other diseases classified elsewhere without behavioral disturbance: Secondary | ICD-10-CM | POA: Diagnosis not present

## 2023-03-28 DIAGNOSIS — G309 Alzheimer's disease, unspecified: Secondary | ICD-10-CM | POA: Diagnosis not present

## 2023-03-28 DIAGNOSIS — F0280 Dementia in other diseases classified elsewhere without behavioral disturbance: Secondary | ICD-10-CM | POA: Diagnosis not present

## 2023-04-03 ENCOUNTER — Other Ambulatory Visit: Payer: Self-pay | Admitting: Family Medicine

## 2023-04-07 DIAGNOSIS — F0280 Dementia in other diseases classified elsewhere without behavioral disturbance: Secondary | ICD-10-CM | POA: Diagnosis not present

## 2023-04-07 DIAGNOSIS — G309 Alzheimer's disease, unspecified: Secondary | ICD-10-CM | POA: Diagnosis not present

## 2023-04-11 DIAGNOSIS — F0280 Dementia in other diseases classified elsewhere without behavioral disturbance: Secondary | ICD-10-CM | POA: Diagnosis not present

## 2023-04-11 DIAGNOSIS — G309 Alzheimer's disease, unspecified: Secondary | ICD-10-CM | POA: Diagnosis not present

## 2023-04-16 DIAGNOSIS — G309 Alzheimer's disease, unspecified: Secondary | ICD-10-CM | POA: Diagnosis not present

## 2023-04-16 DIAGNOSIS — F0280 Dementia in other diseases classified elsewhere without behavioral disturbance: Secondary | ICD-10-CM | POA: Diagnosis not present

## 2023-04-18 DIAGNOSIS — G309 Alzheimer's disease, unspecified: Secondary | ICD-10-CM | POA: Diagnosis not present

## 2023-04-18 DIAGNOSIS — F0280 Dementia in other diseases classified elsewhere without behavioral disturbance: Secondary | ICD-10-CM | POA: Diagnosis not present

## 2023-04-19 DIAGNOSIS — K219 Gastro-esophageal reflux disease without esophagitis: Secondary | ICD-10-CM | POA: Diagnosis not present

## 2023-04-19 DIAGNOSIS — E785 Hyperlipidemia, unspecified: Secondary | ICD-10-CM | POA: Diagnosis not present

## 2023-04-19 DIAGNOSIS — F4389 Other reactions to severe stress: Secondary | ICD-10-CM | POA: Diagnosis not present

## 2023-04-19 DIAGNOSIS — F419 Anxiety disorder, unspecified: Secondary | ICD-10-CM | POA: Diagnosis not present

## 2023-04-19 DIAGNOSIS — F0393 Unspecified dementia, unspecified severity, with mood disturbance: Secondary | ICD-10-CM | POA: Diagnosis not present

## 2023-04-23 DIAGNOSIS — G309 Alzheimer's disease, unspecified: Secondary | ICD-10-CM | POA: Diagnosis not present

## 2023-04-23 DIAGNOSIS — F0280 Dementia in other diseases classified elsewhere without behavioral disturbance: Secondary | ICD-10-CM | POA: Diagnosis not present

## 2023-04-25 DIAGNOSIS — G309 Alzheimer's disease, unspecified: Secondary | ICD-10-CM | POA: Diagnosis not present

## 2023-04-25 DIAGNOSIS — F0280 Dementia in other diseases classified elsewhere without behavioral disturbance: Secondary | ICD-10-CM | POA: Diagnosis not present

## 2023-04-30 DIAGNOSIS — F0280 Dementia in other diseases classified elsewhere without behavioral disturbance: Secondary | ICD-10-CM | POA: Diagnosis not present

## 2023-04-30 DIAGNOSIS — G309 Alzheimer's disease, unspecified: Secondary | ICD-10-CM | POA: Diagnosis not present

## 2023-05-02 DIAGNOSIS — F0280 Dementia in other diseases classified elsewhere without behavioral disturbance: Secondary | ICD-10-CM | POA: Diagnosis not present

## 2023-05-02 DIAGNOSIS — G309 Alzheimer's disease, unspecified: Secondary | ICD-10-CM | POA: Diagnosis not present

## 2023-06-04 ENCOUNTER — Other Ambulatory Visit: Payer: Self-pay | Admitting: Family Medicine

## 2023-06-04 DIAGNOSIS — E039 Hypothyroidism, unspecified: Secondary | ICD-10-CM

## 2023-07-03 ENCOUNTER — Other Ambulatory Visit: Payer: Self-pay | Admitting: Family Medicine

## 2023-07-03 DIAGNOSIS — F419 Anxiety disorder, unspecified: Secondary | ICD-10-CM

## 2023-08-01 ENCOUNTER — Other Ambulatory Visit: Payer: Self-pay | Admitting: Family Medicine

## 2023-10-01 ENCOUNTER — Other Ambulatory Visit: Payer: Self-pay | Admitting: Family Medicine

## 2023-10-01 DIAGNOSIS — E039 Hypothyroidism, unspecified: Secondary | ICD-10-CM

## 2023-10-08 ENCOUNTER — Other Ambulatory Visit: Payer: Self-pay | Admitting: Family Medicine

## 2023-10-08 DIAGNOSIS — F419 Anxiety disorder, unspecified: Secondary | ICD-10-CM

## 2023-10-31 ENCOUNTER — Other Ambulatory Visit: Payer: Self-pay | Admitting: Family Medicine

## 2023-10-31 DIAGNOSIS — E785 Hyperlipidemia, unspecified: Secondary | ICD-10-CM

## 2023-10-31 DIAGNOSIS — F419 Anxiety disorder, unspecified: Secondary | ICD-10-CM

## 2023-11-11 ENCOUNTER — Other Ambulatory Visit: Payer: Self-pay | Admitting: Family Medicine

## 2023-11-11 DIAGNOSIS — E785 Hyperlipidemia, unspecified: Secondary | ICD-10-CM

## 2023-11-11 DIAGNOSIS — F419 Anxiety disorder, unspecified: Secondary | ICD-10-CM

## 2023-11-18 ENCOUNTER — Ambulatory Visit: Payer: Medicare Other

## 2023-11-18 ENCOUNTER — Other Ambulatory Visit: Payer: Self-pay | Admitting: Family Medicine

## 2023-11-18 DIAGNOSIS — F419 Anxiety disorder, unspecified: Secondary | ICD-10-CM

## 2023-11-18 DIAGNOSIS — E785 Hyperlipidemia, unspecified: Secondary | ICD-10-CM

## 2023-11-22 ENCOUNTER — Other Ambulatory Visit: Payer: Self-pay | Admitting: Family Medicine

## 2023-11-22 DIAGNOSIS — E785 Hyperlipidemia, unspecified: Secondary | ICD-10-CM

## 2023-11-27 ENCOUNTER — Other Ambulatory Visit: Payer: Self-pay | Admitting: Family Medicine

## 2023-11-27 DIAGNOSIS — E785 Hyperlipidemia, unspecified: Secondary | ICD-10-CM

## 2023-11-29 ENCOUNTER — Telehealth (INDEPENDENT_AMBULATORY_CARE_PROVIDER_SITE_OTHER): Admitting: Family Medicine

## 2023-11-29 DIAGNOSIS — E559 Vitamin D deficiency, unspecified: Secondary | ICD-10-CM

## 2023-11-29 DIAGNOSIS — E038 Other specified hypothyroidism: Secondary | ICD-10-CM

## 2023-11-29 DIAGNOSIS — F419 Anxiety disorder, unspecified: Secondary | ICD-10-CM | POA: Diagnosis not present

## 2023-11-29 DIAGNOSIS — E7849 Other hyperlipidemia: Secondary | ICD-10-CM

## 2023-11-29 DIAGNOSIS — R7301 Impaired fasting glucose: Secondary | ICD-10-CM | POA: Diagnosis not present

## 2023-11-29 MED ORDER — HYDROXYZINE PAMOATE 25 MG PO CAPS: 25.0000 mg | ORAL_CAPSULE | Freq: Four times a day (QID) | ORAL | 4 refills | Status: AC | PRN

## 2023-11-29 NOTE — Patient Instructions (Addendum)
 I appreciate the opportunity to provide care to you today!    Follow up:  4 months  Labs: please stop by the lab today to get your blood drawn (CBC, CMP, TSH, Lipid profile, HgA1c, Vit D)   Please follow up if your symptoms worsen or fail to improve.   Please stop by your local pharmacy and get your Tdap and Shingles vaccine  Referrals today-   Attached with your AVS, you will find valuable resources for self-education. I highly recommend dedicating some time to thoroughly examine them.   Please continue to a heart-healthy diet and increase your physical activities. Try to exercise for at least five days a week.    It was a pleasure to see you and I look forward to continuing to work together on your health and well-being. Please do not hesitate to call the office if you need care or have questions about your care.  In case of emergency, please visit the Emergency Department for urgent care, or contact our clinic at [phone] to schedule an appointment. We're here to help you!   Have a wonderful day and week. With Gratitude, Matti Killingsworth MSN, FNP-BC

## 2023-11-29 NOTE — Progress Notes (Signed)
 Virtual Visit via Video Note  I connected with Victoria Sims on 11/29/23 at 11:20 AM EDT by a video enabled telemedicine application and verified that I am speaking with the correct person using two identifiers.  Patient Location: Home Provider Location: Office/Clinic  I discussed the limitations, risks, security, and privacy concerns of performing an evaluation and management service by video and the availability of in person appointments. I also discussed with the patient that there may be a patient responsible charge related to this service. The patient expressed understanding and agreed to proceed.  Subjective: PCP: Johnathon Mittal, FNP  Chief Complaint  Patient presents with   Follow-up   HPI I called and spoke with the patient's husband, who requested a refill of Rosuvastatin  20 mg daily and Hydroxyzine  25 mg every 8 hours. He reports that the patient receives home health aide services on Tuesdays and Thursdays for four hours and that assistance is expected to resume next month.  ROS: Per HPI  Current Outpatient Medications:    aspirin 81 MG EC tablet, Take 81 mg by mouth daily., Disp: , Rfl: 99   Cholecalciferol (VITAMIN D -1000 MAX ST) 25 MCG (1000 UT) tablet, Take by mouth., Disp: , Rfl:    diclofenac  Sodium (VOLTAREN ) 1 % GEL, Apply 4 g topically at bedtime., Disp: 4 g, Rfl: 0   Docusate Sodium (DSS) 100 MG CAPS, Take by mouth., Disp: , Rfl:    escitalopram  (LEXAPRO ) 20 MG tablet, TAKE 2 TABLETS BY MOUTH DAILY, Disp: 90 tablet, Rfl: 2   ezetimibe  (ZETIA ) 10 MG tablet, TAKE 1 TABLET BY MOUTH DAILY, Disp: 30 tablet, Rfl: 1   levothyroxine  (SYNTHROID ) 100 MCG tablet, TAKE 1 TABLET BY MOUTH DAILY, Disp: 30 tablet, Rfl: 3   mirabegron  ER (MYRBETRIQ ) 50 MG TB24 tablet, Take 1 tablet (50 mg total) by mouth daily., Disp: 30 tablet, Rfl: 3   mirtazapine (REMERON) 15 MG tablet, TAKE 1 TABLET BY MOUTH AT BEDTIME, Disp: 30 tablet, Rfl: 5   pantoprazole  (PROTONIX ) 40 MG tablet,  TAKE 1 TABLET BY MOUTH DAILY, Disp: 90 tablet, Rfl: 2   rosuvastatin  (CRESTOR ) 20 MG tablet, TAKE 1 TABLET BY MOUTH EVERY DAY, Disp: 90 tablet, Rfl: 3   furosemide  (LASIX ) 20 MG tablet, TAKE 1 TABLET BY MOUTH EVERY DAY AS NEEDED FOR SWELLING NEED TO SCHEDULE VISIT WITH DR HILTY (Patient not taking: Reported on 11/29/2023), Disp: 60 tablet, Rfl: 1   hydrOXYzine  (VISTARIL ) 25 MG capsule, Take 1 capsule (25 mg total) by mouth every 6 (six) hours as needed., Disp: 60 capsule, Rfl: 4   potassium chloride  SA (KLOR-CON  M) 20 MEQ tablet, TAKE 1 TABLET BY MOUTH ONCE A DAY WITH FOOD (Patient not taking: Reported on 11/29/2023), Disp: 30 tablet, Rfl: 5  Observations/Objective: There were no vitals filed for this visit. Physical Exam Patient is well-developed, well-nourished in no acute distress.  Resting comfortably at home.  Head is normocephalic, atraumatic.  No labored breathing.  Speech is clear and coherent with logical content.  Patient is alert and oriented at baseline.   Assessment and Plan: Other hyperlipidemia Assessment & Plan: Pending labs  Will refill once labs are completed   Anxiety -     hydrOXYzine  Pamoate; Take 1 capsule (25 mg total) by mouth every 6 (six) hours as needed.  Dispense: 60 capsule; Refill: 4  IFG (impaired fasting glucose) -     Hemoglobin A1c  Vitamin D  deficiency -     VITAMIN D  25 Hydroxy (Vit-D Deficiency, Fractures)  TSH (thyroid -stimulating hormone deficiency) -     TSH + free T4 -     Lipid panel -     CMP14+EGFR -     CBC with Differential/Platelet    Follow Up Instructions: No follow-ups on file.   I discussed the assessment and treatment plan with the patient. The patient was provided an opportunity to ask questions, and all were answered. The patient agreed with the plan and demonstrated an understanding of the instructions.   The patient was advised to call back or seek an in-person evaluation if the symptoms worsen or if the condition fails  to improve as anticipated.  The above assessment and management plan was discussed with the patient. The patient verbalized understanding of and has agreed to the management plan.   Cody Oliger, FNP

## 2023-11-29 NOTE — Assessment & Plan Note (Signed)
 Pending labs  Will refill once labs are completed

## 2023-12-01 ENCOUNTER — Other Ambulatory Visit: Payer: Self-pay | Admitting: Family Medicine

## 2023-12-08 ENCOUNTER — Other Ambulatory Visit: Payer: Self-pay | Admitting: Family Medicine

## 2023-12-08 DIAGNOSIS — F419 Anxiety disorder, unspecified: Secondary | ICD-10-CM

## 2024-01-28 ENCOUNTER — Other Ambulatory Visit: Payer: Self-pay | Admitting: Family Medicine

## 2024-01-28 DIAGNOSIS — E039 Hypothyroidism, unspecified: Secondary | ICD-10-CM

## 2024-03-29 ENCOUNTER — Other Ambulatory Visit: Payer: Self-pay | Admitting: Family Medicine

## 2024-03-29 DIAGNOSIS — F419 Anxiety disorder, unspecified: Secondary | ICD-10-CM

## 2024-04-07 ENCOUNTER — Other Ambulatory Visit: Payer: Self-pay | Admitting: Family Medicine

## 2024-04-07 DIAGNOSIS — F419 Anxiety disorder, unspecified: Secondary | ICD-10-CM

## 2024-04-21 ENCOUNTER — Other Ambulatory Visit: Payer: Self-pay | Admitting: Family Medicine

## 2024-05-03 ENCOUNTER — Other Ambulatory Visit: Payer: Self-pay | Admitting: Family Medicine

## 2024-05-03 DIAGNOSIS — F419 Anxiety disorder, unspecified: Secondary | ICD-10-CM

## 2024-05-04 ENCOUNTER — Other Ambulatory Visit: Payer: Self-pay | Admitting: Family Medicine

## 2024-05-04 DIAGNOSIS — F419 Anxiety disorder, unspecified: Secondary | ICD-10-CM

## 2024-05-04 NOTE — Telephone Encounter (Signed)
 Copied from CRM #[id number]. Topic: Clinical - Medication Refill >> May 04, 2024  2:43 PM Roselie BROCKS wrote: Medication: escitalopram  (LEXAPRO ) 20 MG tablet  Has the patient contacted their pharmacy? Yes (Agent: If no, request that the patient contact the pharmacy for the refill. If patient does not wish to contact the pharmacy document the reason why and proceed with request.) (Agent: If yes, when and what did the pharmacy advise?)  This is the patient's preferred pharmacy:  [facility] - [place], [place] - [address] [place] [address] Phone: [phone] Fax: [phone]   Is this the correct pharmacy for this prescription? Yes If no, delete pharmacy and type the correct one.   Has the prescription been filled recently? Yes  Is the patient out of the medication? Yes  Has the patient been seen for an appointment in the last year OR does the patient have an upcoming appointment? No  Can we respond through MyChart? Yes  Agent: Please be advised that Rx refills may take up to 3 business days. We ask that you follow-up with your pharmacy.

## 2024-05-06 ENCOUNTER — Other Ambulatory Visit: Payer: Self-pay | Admitting: Family Medicine

## 2024-05-06 DIAGNOSIS — F419 Anxiety disorder, unspecified: Secondary | ICD-10-CM

## 2024-05-11 ENCOUNTER — Other Ambulatory Visit: Payer: Self-pay | Admitting: Family Medicine

## 2024-05-11 ENCOUNTER — Telehealth: Payer: Self-pay

## 2024-05-11 DIAGNOSIS — F419 Anxiety disorder, unspecified: Secondary | ICD-10-CM

## 2024-05-11 NOTE — Telephone Encounter (Signed)
 Copied from CRM [id number]. Topic: General - Other >> May 11, 2024 12:57 PM Delon T wrote: Reason for CRM: May Martin with [employer] is needing to get refills for patient but the med list shows a message that [facility] is not the provider anymore- she needs to know who to send the requests to-  please call May [phone] escitalopram  (LEXAPRO ) 20 MG tablet ezetimibe  (ZETIA ) 10 MG tablet

## 2024-05-11 NOTE — Telephone Encounter (Signed)
Message sent to pharmacy.

## 2024-05-25 ENCOUNTER — Other Ambulatory Visit: Payer: Self-pay | Admitting: Family Medicine

## 2024-05-25 DIAGNOSIS — E039 Hypothyroidism, unspecified: Secondary | ICD-10-CM

## 2024-05-27 ENCOUNTER — Other Ambulatory Visit: Payer: Self-pay | Admitting: Family Medicine

## 2024-05-27 DIAGNOSIS — E039 Hypothyroidism, unspecified: Secondary | ICD-10-CM

## 2024-06-11 ENCOUNTER — Other Ambulatory Visit: Payer: Self-pay | Admitting: Family Medicine

## 2024-06-11 DIAGNOSIS — F419 Anxiety disorder, unspecified: Secondary | ICD-10-CM
